# Patient Record
Sex: Male | Born: 1950
Health system: Southern US, Community
[De-identification: ages and names within clinical notes are randomized; demographics above are authoritative.]

## PROBLEM LIST (undated history)

## (undated) ENCOUNTER — Emergency Department (HOSPITAL_COMMUNITY): Admission: EM | Payer: Medicare FFS | Source: Home / Self Care

## (undated) DIAGNOSIS — G4730 Sleep apnea, unspecified: Secondary | ICD-10-CM

## (undated) DIAGNOSIS — M0520 Rheumatoid vasculitis with rheumatoid arthritis of unspecified site: Secondary | ICD-10-CM

## (undated) DIAGNOSIS — R918 Other nonspecific abnormal finding of lung field: Secondary | ICD-10-CM

## (undated) DIAGNOSIS — I4510 Unspecified right bundle-branch block: Secondary | ICD-10-CM

## (undated) DIAGNOSIS — I428 Other cardiomyopathies: Secondary | ICD-10-CM

## (undated) DIAGNOSIS — I5042 Chronic combined systolic (congestive) and diastolic (congestive) heart failure: Secondary | ICD-10-CM

## (undated) DIAGNOSIS — R011 Cardiac murmur, unspecified: Secondary | ICD-10-CM

## (undated) DIAGNOSIS — Z6841 Body Mass Index (BMI) 40.0 and over, adult: Secondary | ICD-10-CM

## (undated) DIAGNOSIS — F329 Major depressive disorder, single episode, unspecified: Secondary | ICD-10-CM

## (undated) DIAGNOSIS — K219 Gastro-esophageal reflux disease without esophagitis: Secondary | ICD-10-CM

## (undated) DIAGNOSIS — E119 Type 2 diabetes mellitus without complications: Secondary | ICD-10-CM

## (undated) DIAGNOSIS — E7800 Pure hypercholesterolemia, unspecified: Secondary | ICD-10-CM

## (undated) DIAGNOSIS — M48061 Spinal stenosis, lumbar region without neurogenic claudication: Secondary | ICD-10-CM

## (undated) DIAGNOSIS — N289 Disorder of kidney and ureter, unspecified: Secondary | ICD-10-CM

## (undated) DIAGNOSIS — I350 Nonrheumatic aortic (valve) stenosis: Secondary | ICD-10-CM

## (undated) DIAGNOSIS — R55 Syncope and collapse: Secondary | ICD-10-CM

## (undated) DIAGNOSIS — F32A Depression, unspecified: Secondary | ICD-10-CM

## (undated) DIAGNOSIS — G8929 Other chronic pain: Secondary | ICD-10-CM

## (undated) DIAGNOSIS — M549 Dorsalgia, unspecified: Secondary | ICD-10-CM

## (undated) DIAGNOSIS — Z952 Presence of prosthetic heart valve: Secondary | ICD-10-CM

## (undated) DIAGNOSIS — I2510 Atherosclerotic heart disease of native coronary artery without angina pectoris: Secondary | ICD-10-CM

## (undated) DIAGNOSIS — E1140 Type 2 diabetes mellitus with diabetic neuropathy, unspecified: Secondary | ICD-10-CM

## (undated) DIAGNOSIS — I10 Essential (primary) hypertension: Secondary | ICD-10-CM

## (undated) DIAGNOSIS — I440 Atrioventricular block, first degree: Secondary | ICD-10-CM

## (undated) HISTORY — DX: Dorsalgia, unspecified: M54.9

## (undated) HISTORY — DX: Pure hypercholesterolemia, unspecified: E78.00

## (undated) HISTORY — DX: Other cardiomyopathies: I42.8

## (undated) HISTORY — DX: Essential (primary) hypertension: I10

## (undated) HISTORY — DX: Body Mass Index (BMI) 40.0 and over, adult: Z684

## (undated) HISTORY — PX: TONSILLECTOMY: SUR1361

## (undated) HISTORY — DX: Morbid (severe) obesity due to excess calories: E66.01

## (undated) HISTORY — PX: UMBILICAL HERNIA REPAIR: SHX196

## (undated) HISTORY — DX: Major depressive disorder, single episode, unspecified: F32.9

## (undated) HISTORY — DX: Type 2 diabetes mellitus with diabetic neuropathy, unspecified: E11.40

## (undated) HISTORY — DX: Syncope and collapse: R55

## (undated) HISTORY — PX: HERNIA REPAIR: SHX51

## (undated) HISTORY — DX: Other chronic pain: G89.29

## (undated) HISTORY — DX: Sleep apnea, unspecified: G47.30

## (undated) HISTORY — PX: SHOULDER ARTHROSCOPY: SHX128

## (undated) HISTORY — DX: Depression, unspecified: F32.A

## (undated) HISTORY — DX: Nonrheumatic aortic (valve) stenosis: I35.0

## (undated) HISTORY — DX: Atherosclerotic heart disease of native coronary artery without angina pectoris: I25.10

## (undated) HISTORY — PX: LAPAROSCOPIC GASTRIC BANDING: SHX1100

---

## 1999-08-15 ENCOUNTER — Encounter: Admission: RE | Admit: 1999-08-15 | Discharge: 1999-11-13 | Payer: Self-pay | Admitting: Internal Medicine

## 2000-11-03 ENCOUNTER — Encounter: Payer: Self-pay | Admitting: Internal Medicine

## 2000-11-03 ENCOUNTER — Ambulatory Visit (HOSPITAL_COMMUNITY): Admission: RE | Admit: 2000-11-03 | Discharge: 2000-11-03 | Payer: Self-pay | Admitting: Internal Medicine

## 2001-06-17 ENCOUNTER — Observation Stay (HOSPITAL_COMMUNITY): Admission: AD | Admit: 2001-06-17 | Discharge: 2001-06-19 | Payer: Self-pay | Admitting: Internal Medicine

## 2001-06-17 ENCOUNTER — Encounter: Payer: Self-pay | Admitting: Internal Medicine

## 2002-10-01 ENCOUNTER — Observation Stay (HOSPITAL_COMMUNITY): Admission: RE | Admit: 2002-10-01 | Discharge: 2002-10-02 | Payer: Self-pay | Admitting: General Surgery

## 2002-11-16 ENCOUNTER — Ambulatory Visit (HOSPITAL_COMMUNITY): Admission: RE | Admit: 2002-11-16 | Discharge: 2002-11-17 | Payer: Self-pay | Admitting: Orthopedic Surgery

## 2002-11-16 ENCOUNTER — Encounter: Payer: Self-pay | Admitting: Orthopedic Surgery

## 2004-09-22 ENCOUNTER — Emergency Department (HOSPITAL_COMMUNITY): Admission: EM | Admit: 2004-09-22 | Discharge: 2004-09-22 | Payer: Self-pay | Admitting: Emergency Medicine

## 2004-09-23 ENCOUNTER — Emergency Department (HOSPITAL_COMMUNITY): Admission: EM | Admit: 2004-09-23 | Discharge: 2004-09-23 | Payer: Self-pay | Admitting: Emergency Medicine

## 2006-06-29 ENCOUNTER — Emergency Department (HOSPITAL_COMMUNITY): Admission: EM | Admit: 2006-06-29 | Discharge: 2006-06-29 | Payer: Self-pay | Admitting: Internal Medicine

## 2006-08-07 ENCOUNTER — Ambulatory Visit (HOSPITAL_COMMUNITY): Admission: RE | Admit: 2006-08-07 | Discharge: 2006-08-07 | Payer: Self-pay | Admitting: Internal Medicine

## 2006-12-01 ENCOUNTER — Ambulatory Visit: Admission: RE | Admit: 2006-12-01 | Discharge: 2006-12-01 | Payer: Self-pay | Admitting: Internal Medicine

## 2006-12-08 ENCOUNTER — Ambulatory Visit: Payer: Self-pay | Admitting: Pulmonary Disease

## 2006-12-18 ENCOUNTER — Ambulatory Visit (HOSPITAL_COMMUNITY): Admission: RE | Admit: 2006-12-18 | Discharge: 2006-12-18 | Payer: Self-pay | Admitting: Internal Medicine

## 2008-06-01 ENCOUNTER — Ambulatory Visit (HOSPITAL_COMMUNITY): Admission: RE | Admit: 2008-06-01 | Discharge: 2008-06-01 | Payer: Self-pay | Admitting: General Surgery

## 2010-12-11 NOTE — Procedures (Signed)
NAME:  ARMANI, BRAR NO.:  [id number]   MEDICAL RECORD NO.:  [id number]          PATIENT TYPE:  OUT   LOCATION:  DFTL                          FACILITY:  [facility]   PHYSICIAN:  Kingsley Callander. Ouida Sills, MD       DATE OF BIRTH:  1950-11-12   DATE OF PROCEDURE:  12/18/2006  DATE OF DISCHARGE:                                  STRESS TEST   Mr. Mankey exercised 4 minutes (1 minute into stage 2  of the Bruce  protocol) obtaining a maximal heart rate of 160 (96% age predicted  maximal heart rate) at a work load of 7 mets and discontinued exercise  due to ankle pain and dyspnea. There were no symptoms of chest pain.  There were no arrhythmias.  There were no ST-segment changes diagnostic  of ischemia.  His baseline EKG revealed normal sinus rhythm at 82 beats  per minute. His resting blood pressure was elevated at 168/88 today.  He  reached a maximum blood pressure of 182/70 during exercise.   IMPRESSION:  No evidence of ischemia, limited exercise tolerance.  Resting hypertension.      Kingsley Callander. Ouida Sills, MD  Electronically Signed     ROF/MEDQ  D:  12/18/2006  T:  12/18/2006  Job:  [id number]

## 2010-12-11 NOTE — H&P (Signed)
NAME:  Daniel Valenzuela, Daniel Valenzuela NO.:  [id number]   MEDICAL RECORD NO.:  [id number]          PATIENT TYPE:  AMB   LOCATION:  DAY                           FACILITY:  [facility]   PHYSICIAN:  Dalia Heading, M.D.  DATE OF BIRTH:  30-Dec-1950   DATE OF ADMISSION:  DATE OF DISCHARGE:  LH                              HISTORY & PHYSICAL   CHIEF COMPLAINT:  Need for screening colonoscopy.   HISTORY OF PRESENT ILLNESS:  The patient is a 60 year old white male who  was referred for endoscopic evaluation.  He needs colonoscopy for  screening purposes.  No abdominal pain, weight loss, nausea, vomiting,  diarrhea, constipation, melena, or hematochezia have been noted.  He has  never had a colonoscopy.  There is no family history of colon carcinoma.   PAST MEDICAL HISTORY:  1. Insulin-dependent diabetes mellitus.  2. Hypertension.   PAST SURGICAL HISTORY:  Umbilical herniorrhaphy, lap-band procedure.   CURRENT MEDICATIONS:  Lantus, verapamil, Darvocet, Neurontin, and  cholesterol medication.   ALLERGIES:  ALDORIL, AMOXICILLIN, and MOTRIN.   REVIEW OF SYSTEMS:  The patient denies drinking and smoking.  Denies any  other cardiopulmonary difficulties or bleeding disorders.   PHYSICAL EXAMINATION:  GENERAL:  The patient is a well-developed, well-  nourished white male in no acute distress.  LUNGS:  Clear to auscultation with equal breath sounds bilaterally.  HEART:  Regular rate and rhythm without S3, S4, or murmurs.  ABDOMEN:  Soft, nontender, and nondistended.  No hepatosplenomegaly or  masses were noted.  RECTAL:  Deferred to the procedure.   IMPRESSION:  Need for screening colonoscopy.   PLAN:  The patient is scheduled for a colonoscopy on June 01, 2008.  The risks and benefits of the procedure including bleeding and  perforation were fully explained to the patient, gave informed consent.      Dalia Heading, M.D.  Electronically Signed     MAJ/MEDQ  D:  05/26/2008   T:  05/27/2008  Job:  [id number]   cc:   Kingsley Callander. Ouida Sills, MD  Fax: [phone]

## 2010-12-14 NOTE — Op Note (Signed)
NAME:  Daniel Valenzuela, Daniel Valenzuela NO.:  [id number]   MEDICAL RECORD NO.:  [id number]                   PATIENT TYPE:  AMB   LOCATION:  DAY                                  FACILITY:  [facility]   PHYSICIAN:  Dalia Heading, M.D.               DATE OF BIRTH:  31-Dec-1950   DATE OF PROCEDURE:  10/01/2002  DATE OF DISCHARGE:                                 OPERATIVE REPORT   PREOPERATIVE DIAGNOSES:  1. Incarcerated umbilical hernia.  2. Enlarging mass on forehead.   POSTOPERATIVE DIAGNOSES:  1. Incarcerated umbilical hernia.  2. Enlarging mass on forehead.   PROCEDURES:  1. Umbilical herniorrhaphy with mesh.  2. Excision of benign 1.5 cm mass, forehead.   SURGEON:  Dalia Heading, M.D.   ANESTHESIA:  General endotracheal.   COMPLICATIONS:  None.   SPECIMENS:  1. Omentum and umbilical hernia sac.  2. Mass, forehead.   ESTIMATED BLOOD LOSS:  100 mL.   INDICATIONS:  The patient is a 60 year old morbidly obese white male who  presents with an incarcerated umbilical hernia as well as a mass on his  forehead.  Risks and benefits of both procedures, including bleeding,  infection, and recurrence of the hernia, were fully explained to the  patient, who gave informed consent.   DESCRIPTION OF PROCEDURE:  The patient was placed in the supine position.  After induction of general endotracheal anesthesia, the abdomen was prepped  and draped using the usual sterile technique with Betadine.  Surgical site  confirmation was performed.   An infraumbilical incision was made down to the subcutaneous tissue.  The  patient had a large hernia sac, which was freed away from the overlying  umbilicus down to the fascia.  The hernia sac had to be entered into in  order to help reduce the omentum that was present.  A partial omentectomy  was performed in order to facilitate reduction.  This was performed using an  LDS stapler.  The hernia sac was then excised.  The fascial  defect, which  was approximately 4-5 cm in its greatest diameter, was closed using 0  Ethibond interrupted sutures.  Next, Prolene mesh was placed over the repair  and secured to the fascia using #1 Novofil sutures.  The base of the  umbilicus was then secured to the fascia using a 2-0 Vicryl interrupted  suture.  The subcutaneous layer was reapproximated using a 3-0 Vicryl  interrupted suture.  The skin was closed using staples.  Sensorcaine 0.5%  was instilled into the surrounding wound.  Betadine ointment and a dry  sterile dressing were applied.   Next, the right upper forehead mass was prepped using Betadine.  A  transverse incision was made over the mass and the mass was excised without  difficulty.  It was noted to be a lipoma.  It was sent to pathology for  further examination.  Any  bleeding was controlled using Bovie  electrocautery.  The skin was closed using a 5-0 nylon interrupted suture.  Neosporin ointment was then applied.   All tape and needle counts were correct at the end of the procedures.  The  patient was extubated in the operating room and went back to the recovery  room awake, in stable condition.                                               Dalia Heading, M.D.    MAJ/MEDQ  D:  10/01/2002  T:  10/01/2002  Job:  [id number]   cc:   Kingsley Callander. Ouida Sills, M.D.  [address]  [place]  [place] [address]  Fax: [phone]

## 2010-12-14 NOTE — H&P (Signed)
NAME:  WITTEN, CERTAIN NO.:  [id number]   MEDICAL RECORD NO.:  [id number]                  PATIENT TYPE:   LOCATION:                                       FACILITY:   PHYSICIAN:  Dalia Heading, M.D.               DATE OF BIRTH:  March 19, 1951   DATE OF ADMISSION:  DATE OF DISCHARGE:                                HISTORY & PHYSICAL   CHIEF COMPLAINT:  Umbilical hernia, mass on forehead.   HISTORY OF PRESENT ILLNESS:  The patient is a 60 year old white male who has  had ongoing problems with an umbilical hernia.  It has been present for some  time but his surgery was delayed as he was instructed to try to lose weight.  He has lost 80 pounds over the past nine months.  He now presents for an  umbilical hernia repair.  He also has a mass on his forehead that he would  like removed.  He denies any nausea or vomiting.   PAST MEDICAL HISTORY:  1. Hypertension.  2. Diabetes mellitus.  3. Depression.   PAST SURGICAL HISTORY:  Tonsillectomy in the remote past.   CURRENT MEDICATIONS:  1. Norvasc 5 mg p.o. daily.  2. Avandia 8 mg p.o. daily.  3. Cozaar 100 mg p.o. daily.  4. Paxil one tablet p.o. daily.  5. Bextra one tablet p.o. daily.  6. Neurontin 300 mg p.o. daily.  7. Lasix 40 mg p.o. daily.  8. Lipitor 40 mg p.o. daily.  9. Insulin L injections daily.  10.      Humalog p.r.n.   ALLERGIES:  ALDORIL, AMOXICILLIN, MOTRIN.   REVIEW OF SYSTEMS:  The patient denies drinking or smoking.  Denies any  recent cardiopulmonary difficulties or bleeding disorders.   PHYSICAL EXAMINATION:  GENERAL:  The patient is a well-developed, well-  nourished white male in no acute distress.  VITAL SIGNS:  He is afebrile.  Vital signs are stable.  LUNGS:  Clear to auscultation with equal breath sounds bilaterally.  HEART:  Regular rate and rhythm without S3, S3, or murmurs.  ABDOMEN:  Soft.  Large umbilical hernia is present.  No inguinal hernias are  noted.  No  hepatosplenomegaly or masses are noted.  HEENT:  2 cm ovoid subcutaneous mass on the right side of the forehead.   IMPRESSION:  1. Umbilical hernia.  2. Mass, forehead.   PLAN:  The patient is scheduled for an umbilical herniorrhaphy with possible  mesh placement as well as excision of a mass on his forehead on October 01, 2002.  The risks and benefits of the procedures including bleeding,  infection, recurrence of the hernia were fully explained to the patient,  gave informed consent.  Dalia Heading, M.D.    MAJ/MEDQ  D:  09/23/2002  T:  09/23/2002  Job:  [id number]   cc:   Kingsley Callander. Ouida Sills, M.D.  [address]  [place]  [place] [address]  Fax: [phone]

## 2010-12-14 NOTE — Op Note (Signed)
NAME:  Daniel Valenzuela, Daniel Valenzuela NO.:  [id number]   MEDICAL RECORD NO.:  [id number]                   PATIENT TYPE:  OIB   LOCATION:  5022                                 FACILITY:  [facility]   PHYSICIAN:  Deidre Ala, M.D.                 DATE OF BIRTH:  [birth year]   DATE OF PROCEDURE:  11/16/2002  DATE OF DISCHARGE:                                 OPERATIVE REPORT   PREOPERATIVE DIAGNOSIS:  Right shoulder impingement syndrome with  acromioclavicular joint arthritis, rule out rotator cuff tear, morbid  obesity.   POSTOPERATIVE DIAGNOSES:  1. Right shoulder impingement syndrome.  2. Acromioclavicular joint arthritis.  3. Severe subdeltoid bursitis.  4. Through-and-through rotator cuff tear longitudinal with intact tuft of     tuberosity, tear 1.5 cm.  5. Morbid obesity.   OPERATION PERFORMED:  1. Right shoulder operative arthroscopy with subacromial arch decompression     acromioplasty.  2. Arthroscopic distal clavicle resection.  3. Subdeltoid bursectomy.  4. Debris glenohumeral synovitis and rotator cuff tear.   SURGEON:  Bradley Ferris, M.D.   ASSISTANT:  Madilyn Fireman, P.A.-C.   ANESTHESIA:  General endotracheal.   CULTURES:  None.   DRAINS:  None.   ESTIMATED BLOOD LOSS:  Minimal.   PATHOLOGIC FINDINGS AND HISTORY:  The patient weighs 350 pounds and has  sleep apnea.  He has lost recently 80 pounds.  That was deemed best for [facility].  We will admit him overnight.  He did have a block.  He was very  difficult due to his arm size and bleeding.  This was bleeding  intraoperatively with the arthroscope but ultimately it cleared and we got  distal clavicle resection, acromioplasty with parameters of Caspari.  The  rotator cuff tear was longitudinal.  The tissue was poor.  I felt we would  cause more damage going through the deltoid to fix it with a side-to-side  repair that would not hold and he would scar.  We therefore trimmed the  edges.   It was intact around this tear to the tuberosity.  It was more in  the interval between the supraspinatus infraspinatus and it was about 1.5  cm.  The glenohumeral joint had intact biceps, no SLAP lesion, did not have  significant glenohumeral degenerative change and the humeral head,  glenohumeral surfaces were not significantly involved with arthritis.   DESCRIPTION OF PROCEDURE:  With adequate anesthesia obtained using  endotracheal technique and very laborious pains for appropriate positioning,  the patient was placed in the supine beach chair position with a Schlein  shoulder holder.  After standard prepping and draping of the right shoulder,  skin markers were made for anatomic positioning.  20mL 0.5% Marcaine with  epinephrine was injected in the subacromial space to open it up.  I then  entered the shoulder through a posterior portal.  Anterior portal was  established just lateral to the coracoid.  I then thoroughly inspected the  joint and used a shaver to remove synovium under the rotator cuff, around  the biceps and to check the SLAP area.  I then used the ablator to smooth.  Portals were reversed and similar shaving was carried out.  I then arduously  entered the subacromial space from the posterior portal.  Anterolateral  portal was established and acromioplasty was started from anterolateral  portal.  I had to take out a fair amount of bursa just to be able to see.  To complete it I looked through the lateral portal and brought in the shaver  from the anterior portal and completed acromioplasty back to the bicortical  space in the manner of Caspari.  Then I debrided through the anterior portal  the AC meniscus, then used the shaver to shave it two shaverbreadths in.  I  then cauterized around the distal clavicle resection. I then turned the  scope downward and debrided subacromial bursa.  I then debrided the edges of  the rotator cuff ear but left it basically intact, not  debriding out the  spinatus, just trimming the edges and using the ablator to smooth, to  decrease inflammation.  I checked to make sure that the rotator cuff distal  to the tear and all around it was attached to the tuberosity and it was.  _________  ablator was used with internal external rotation and abduction to  smooth the surface.  When I was satisfied with resection, the shoulder was  irrigated through the scope with 0.5% Marcaine injected in and about the  portals.  The portals were left open.  A bulky sterile dressing was applied  with sling and the patient having tolerated the procedure well was awakened  and taken to recovery room in satisfactory condition to be admitted for  overnight stay and discharged tomorrow with Percocet and told to call the  office for appointment for recheck in two or three days.                                                Deidre Ala, M.D.    VEP/MEDQ  D:  11/16/2002  T:  11/16/2002  Job:  [id number]   cc:   Kingsley Callander. Ouida Sills, M.D.  [address]  [place]  [place] [address]  Fax: [phone]

## 2010-12-14 NOTE — H&P (Signed)
[facility]  Patient:    Daniel Valenzuela, Daniel Valenzuela Visit Number: [id number] MRN: [id number]          Service Type: OBS Location: 2A A212 01 Attending Physician:  Carylon Perches Dictated by:   Carylon Perches, M.D. Admit Date:  06/17/2001 Discharge Date: 06/19/2001                           History and Physical  CHIEF COMPLAINT:  Chest pain and sweating.  HISTORY OF PRESENT ILLNESS:  This patient is a 60 year old white male who presented to the office for evaluation of an episode of chest pain and sweating yesterday.  He had been carrying approximately a 25-pound piece of duct work when he experienced a brief episode of substernal chest pain and then broke out into a profuse sweat.  He sat down and had no further chest pain.  His sweating resolved.  He went about his work the rest of the day.  He did not vomit or experience syncope.  He had mild shortness of breath.  He did not feel as though this episode was related to hypoglycemia.  He has a past history of diabetes, hypertension, and hyperlipidemia.  He does not smoke.  He has previously had a negative Cardiolite stress test in 1998.  He had a negative routine stress test in 1999.  PAST MEDICAL HISTORY: 1. Type 2 diabetes. 2. Morbid obesity. 3. Hypertension. 4. Hyperlipidemia. 5. Diabetic neuropathy. 6. Anxiety. 7. Osteoarthritis.  MEDICATIONS: 1. Lipitor 40 mg q.d. 2. Cozaar 100 mg q.d. 3. Paxil 30 mg q.d. 4. Avandia 8 mg q.d. 5. Humulin 70/30, 55 units q.a.m., 40 units q.p.m. 6. Humalog 20 units at supper. 7. Lasix 40 mg q.d. 8. Neurontin 300 mg b.i.d.  ALLERGIES:  AMOXIL, MOTRIN, ALDORIL.  SOCIAL HISTORY:  He does not use any tobacco, alcohol, or recreational substances.  FAMILY HISTORY:  His mother died of cancer.  His father had Parkinsons disease.  REVIEW OF SYSTEMS:  No cough, fever, weight loss.  PHYSICAL EXAMINATION:  VITAL SIGNS:  Blood pressure 156/90, pulse 76, respirations  16.  GENERAL:  A morbidly obese white male.  HEENT:  No scleral icterus.  Pharynx unremarkable.  NECK:  Supple with no JVD, thyromegaly, or bruit.  LUNGS:  Clear.  HEART:  Regular with no murmurs.  ABDOMEN:  A large umbilical hernia is present.  No hepatosplenomegaly.  EXTREMITIES:  No foot ulcers.  No edema, cyanosis, or clubbing.  NEUROLOGIC:  Grossly intact.  LYMPH NODES:  No enlargement.  LABORATORY DATA:  His EKG reveals normal sinus rhythm with no definite ischemic changes.  IMPRESSION: 1. Chest pain and diaphoresis in a morbidly obese male with multiple    cardiovascular risk factors.  He is being hospitalized in a monitored    setting on 2A for serial cardiac enzymes.  If his enzymes are positive, he    will be further evaluated by cardiology, likely with cardiac    catheterization.  If his enzymes are negative, he will be evaluated with a    Cardiolite stress test. 2. Diabetes.  Continue with Avandia and insulin. 3. Hypertension.  Continue Cozaar. 4. Hyperlipidemia.  Continue Lipitor. 5. Diabetic neuropathy.  Continue Neurontin. 6. Anxiety.  Continue Paxil. Dictated by:   Carylon Perches, M.D. Attending Physician:  Carylon Perches DD:  06/17/01 TD:  06/17/01 Job: [id number]

## 2010-12-14 NOTE — Procedures (Signed)
NAME:  Daniel Valenzuela, FESSEL NO.:  [id number]   MEDICAL RECORD NO.:  [id number]          PATIENT TYPE:  OUT   LOCATION:  SLEEP LAB                     FACILITY:  [facility]   PHYSICIAN:  Barbaraann Share, MD,FCCPDATE OF BIRTH:  1950-12-10   DATE OF STUDY:  12/01/2006                            NOCTURNAL POLYSOMNOGRAM   REFERRING PHYSICIAN:  Kingsley Callander. Ouida Sills, MD   LOCATION:  Sleep lab.   INDICATION FOR STUDY:  Hypersomnia with sleep apnea.   EPWORTH SCORE:  10.   SLEEP ARCHITECTURE:  The patient had total sleep time of 309 minutes  with adequate slow-wave sleep but decreased REM.  Sleep onset latency  was prolonged at 48 minutes and REM onset was very prolonged at 246  minutes.  Sleep efficiency was very decreased at 65%.   RESPIRATORY DATA:  The patient underwent split-night protocol where he  was found to have 96 obstructive events in the first 90 minutes of  sleep.  This gave him an extrapolated apnea/hypopnea index of 64 events  per hour during the first half of the night.  The events occurred in all  body positions and there was very loud snoring noted throughout.  By  protocol, the patient was placed on a medium Respironics ComfortGel  nasal CPAP mask and CPAP titration was initiated.  The pressure was  increased to treat both snoring and obstructive events, and at a final  pressure of 9 cm of water, the patient had excellent control even  through supine REM.   OXYGEN DATA:  There was O2 desaturation as low as 78% with the patient's  obstructive events.   CARDIAC DATA:  Occasional PVCs were noted.   MOVEMENT/PARASOMNIA:  The patient was found to have 149 leg jerks with 8  per hour resulting in arousal or awakening.  They did seem to decrease  in frequency with optimal CPAP.   IMPRESSION/RECOMMENDATIONS:  1. Split-night study reveals severe obstructive sleep apnea/hypopnea      syndrome with an apnea/hypopnea index of 64 events per hour and O2      desaturation as  low as 78% during the first half of the night.  The      patient was then placed on continuous positive airway pressure with      a medium Respironics ComfortGel nasal mask and ultimately titrated      to a final pressure of 9 cm of water.  There was excellent control      even through supine rapid eye movement.  2. Occasional premature ventricular contraction without clinically      significant cardiac arrhythmias.  3. Large numbers of leg jerks with significant sleep disruption.  More      than likely this is related to the patient's sleep-disordered      breathing since there clearly was a decrease in the number of leg      jerks as the continuous      positive airway pressure was optimized.  However, would continue to      keep a primary movement disorder of sleep in mind if the patient  does not respond appropriately to optimal continuous positive      airway pressure.      Barbaraann Share, MD,FCCP  Diplomate, American Board of Sleep  Medicine  Electronically Signed     [facility]/MEDQ  D:  12/04/2006 11:37:51  T:  12/04/2006 12:34:11  Job:  [id number]

## 2010-12-14 NOTE — Procedures (Signed)
[facility]  Patient:    Daniel Valenzuela, Daniel Valenzuela Visit Number: [id number] MRN: [id number]          Service Type: OBS Location: 2A A212 01 Attending Physician:  Carylon Perches Dictated by:   Delton See, P.A. Proc. Date: 06/19/01 Admit Date:  06/17/2001 Discharge Date: 06/19/2001   CC:         Carylon Perches, M.D.   Stress Test  DATE OF BIRTH:  [birth year]  PROCEDURE:  Dobutamine stress test.  INDICATION:  [name] is a pleasant 60 year old male who was admitted to [facility] on June 17, 2001 for evaluation of chest pain by Dr. Carylon Perches, his primary physician.  The patient does have significant risk factors for coronary disease including obesity, history of diabetes, hypertension and elevated cholesterol levels.  He was to be scheduled for an exercise Cardiolite, however, he did not meet the criteria for weight limitations on the treadmill and the Cardiolite could not be performed.  An echo was attempted, however, the windows were poor and a decision was eventually made to proceed with a dobutamine stress test without Cardiolite images.  Prior to the test today, the patient reports some chest soreness approximately two days ago but felt fine prior to the study.  His baseline EKG shows sinus rhythm, rate 93 beats per minute, without ischemic changes.  Blood pressure is 114/78.  Target heart rate was 147 beats per minute.  DESCRIPTION OF PROCEDURE:  Dobutamine was started at 10 micro-drops per minute, gradually increased to 20, leg lifts were initiated and the patient met his target heart rate; the maximum heart rate was 156 beats per minute. The patient had no symptoms other than feeling his heart racing and pounding. The EKG showed no ischemic changes.  The test was concluded.  IMPRESSION:  We are interpreting this as a negative dobutamine stress test. Dictated by:   Delton See, P.A. Attending Physician:  Carylon Perches DD:   06/19/01 TD:  06/20/01 Job: [id number]

## 2011-04-30 LAB — GLUCOSE, CAPILLARY: Glucose-Capillary: 90

## 2011-08-09 ENCOUNTER — Telehealth (HOSPITAL_COMMUNITY): Payer: Self-pay | Admitting: Dietician

## 2011-08-09 NOTE — Telephone Encounter (Signed)
Received referral from Dr. Alonza Smoker office for dx: diabetes, obesity, HTN, chronic back pain, and diabetic neuropathy on 08/06/11.

## 2011-08-09 NOTE — Telephone Encounter (Signed)
Appointment scheduled for 08/21/11 at 10 AM.

## 2011-08-19 ENCOUNTER — Telehealth (HOSPITAL_COMMUNITY): Payer: Self-pay | Admitting: Dietician

## 2011-08-20 NOTE — Telephone Encounter (Signed)
Appointment rescheduled for 08/22/11 at 2:00 PM.

## 2011-08-22 ENCOUNTER — Encounter (HOSPITAL_COMMUNITY): Payer: Self-pay | Admitting: Dietician

## 2011-08-22 DIAGNOSIS — E669 Obesity, unspecified: Secondary | ICD-10-CM | POA: Insufficient documentation

## 2011-08-22 DIAGNOSIS — M4800 Spinal stenosis, site unspecified: Secondary | ICD-10-CM | POA: Insufficient documentation

## 2011-08-22 DIAGNOSIS — M549 Dorsalgia, unspecified: Secondary | ICD-10-CM | POA: Insufficient documentation

## 2011-08-22 DIAGNOSIS — E1140 Type 2 diabetes mellitus with diabetic neuropathy, unspecified: Secondary | ICD-10-CM | POA: Insufficient documentation

## 2011-08-22 DIAGNOSIS — E7800 Pure hypercholesterolemia, unspecified: Secondary | ICD-10-CM | POA: Insufficient documentation

## 2011-08-22 NOTE — Progress Notes (Signed)
Outpatient Initial Nutrition Assessment  Date:08/22/2011   Time: 2:00 PM  Referring Physician: Dr. Ouida Sills Reason for Visit: diabetes, obesity  Nutrition Assessment:  Ht: 67" Wt: 348# (self reported. Pt refused to weigh in office. Reports he was 348# on his home scale this AM) IBW: 148# %IBW: 235% UBW: 348# %UBW: 100% BMI: 54.50 Goal Weight: 313# (10% weight loss) Weight hx: Pt reports his highest weight was 452# in April 2008, prior to his lap band surgery. His lowest weight was 220# at age 59-32. He was able to maintain a weight of 290# until 1990, when he progressively gain weight. He reports he has always struggled with his weight.   Estimated nutritional needs: 2960-3229 kcals daily, 127-158 grams protein daily, 3.0-3.2 L fluid daily  PMH:  Past Medical History  Diagnosis Date  . Diabetes mellitus   . Hypertension   . Diabetic neuropathy   . Obesity   . Chronic back pain   . Spinal stenosis   . High cholesterol   . Depression   . Sleep apnea     Medications: No current outpatient prescriptions on file.  Pt did not bring list of meds to appointment today.   Labs:  CMP  No results found for this basename: na, k, cl, co2, glucose, bun, creatinine, calcium, prot, albumin, ast, alt, alkphos, bilitot, gfrnonaa, gfraa     Lipid Panel  No results found for this basename: chol, trig, hdl, cholhdl, vldl, ldlcalc     No results found for this basename: HGBA1C   No results found for this basename: GLUF, MICROALBUR, LDLCALC, CREATININE   Per Dr. Alonza Smoker records: Hgb A1c: 8.7, Fasting blood glucose: 203, Total Cholesterol: 185, HDL: 36, LDL: 114, Triglycerides: 175  Nutrition hx/habits: [name] is a very pleasant gentleman who lives in [place], [place] with his wife, Corrie Dandy, who is present with him today. He is currently on disability due to back pain, but used to work for [employer] and [employer]. He reports a stress level of 7-10, citing finances, road rage, and family issues (pt and  wife are often responsible for their 28 year old grandson, who is bullied at school and has lack of parental support). Pt wife also complains of financial stressors and being unable to afford "healthy food". Pt reports he has "not been eating right", especially during the holidays. He checks his CBGs twice daily, reporting the run in the 160's, both fasting and postprandial. He admits that his biggest issue is portion control. He reports he has not been exercising, due to foot and knee pain. Pt reports that they both recently received their Silver Sneakers cards and plan to try water aerobics. Pt also recently purchased a bicycle, which he intends to use when it is warmer, but admits that he experiences pain if riding for long periods of time. Noted pt's last visit here was in July 2008 and pt has lost a significant amount of weight (81#, 19%) since then.   Diet recall: Breakfast: cereal OR oatmeal OR Ensure OR skim milk; Snack: decaf coffee with artificial sweetener; Lunch: "green bean chili" (prepared by wife- mixture of boiled hamburger meat, diced tomatoes, chili seasoning, and green beans), cheese, oyster crackers; Dinner: same as lunch Pt mostly drinks diet mountain dew, unsweetened tea with artificial sweetener, or coffee with artificial sweetener  Nutrition Diagnosis: Excessive carbohydrate intake r/t increased portions of simple carbohydrates AEB Hgb A1c: 8.7.   Nutrition Intervention: Nutrition rx:2500 calorie diabetic, NAS diet; 3 meals/day (60-75 grams carbohydrate  per meal); low calorie beverages only; do not drink fluids before meals; meals 4-5 hours apart; 2.5 hours physical activity per week  Education/Counseling Provided: Educated pt and wife on diabetic diet principles. Emphasized sources of carbohydrate, portion control, and plate method. Discussed low cost recipes and ingredients. Discussed high fiber foods and encouraged fruit and vegetable intake. Discussed more economical protein  alternatives to meat and fish, such as beans and eggs. Discussed importance of regular physical activity. Educated on slow, moderate weight loss with small lifestyle changes; 1-2# weight loss per week.   Understanding, Motivation, Ability to Follow Recommendations: Expect fair to good compliance.   Monitoring and Evaluation: Goals: 1) 1-2# weight loss per week; 2) 2.5 hours physical activity daily; 3) Hgb A1c < 7.0  Recommendations: 1) For weight loss: 2460-2729 kcals daily; 2) Use measuring cups to ensure proper portions; 3) Limit crackers to 4-6 per meal; 4) Try water aerobics class; 5) Break up exercise into smaller more frequent sessions (ex. Ride bike for 10 minutes 3 times daily)  F/U: PRN. Provided RD contact information. Due to commuting distance and finances, encouraged pt to call with questions or schedule appointment on same day as f/u with Dr. Ouida Sills.   Orlene Plum, RD  08/22/2011  Time: 2:00 PM

## 2012-03-16 ENCOUNTER — Other Ambulatory Visit (HOSPITAL_COMMUNITY): Payer: Self-pay | Admitting: Internal Medicine

## 2012-03-16 ENCOUNTER — Ambulatory Visit (HOSPITAL_COMMUNITY)
Admission: RE | Admit: 2012-03-16 | Discharge: 2012-03-16 | Disposition: A | Discharge status: Home / Self Care | Payer: Medicare PPO | Source: Ambulatory Visit | Attending: Internal Medicine | Admitting: Internal Medicine

## 2012-03-16 DIAGNOSIS — M5380 Other specified dorsopathies, site unspecified: Secondary | ICD-10-CM | POA: Insufficient documentation

## 2012-03-16 DIAGNOSIS — R05 Cough: Secondary | ICD-10-CM | POA: Insufficient documentation

## 2012-03-16 DIAGNOSIS — J9819 Other pulmonary collapse: Secondary | ICD-10-CM | POA: Insufficient documentation

## 2012-03-16 DIAGNOSIS — Z77090 Contact with and (suspected) exposure to asbestos: Secondary | ICD-10-CM | POA: Insufficient documentation

## 2012-03-16 DIAGNOSIS — R059 Cough, unspecified: Secondary | ICD-10-CM | POA: Insufficient documentation

## 2013-01-25 ENCOUNTER — Other Ambulatory Visit (HOSPITAL_COMMUNITY): Payer: Self-pay | Admitting: Internal Medicine

## 2013-01-25 ENCOUNTER — Ambulatory Visit (HOSPITAL_COMMUNITY)
Admission: RE | Admit: 2013-01-25 | Discharge: 2013-01-25 | Disposition: A | Discharge status: Home / Self Care | Payer: Medicare PPO | Source: Ambulatory Visit | Attending: Internal Medicine | Admitting: Internal Medicine

## 2013-01-25 DIAGNOSIS — E119 Type 2 diabetes mellitus without complications: Secondary | ICD-10-CM | POA: Insufficient documentation

## 2013-01-25 DIAGNOSIS — J984 Other disorders of lung: Secondary | ICD-10-CM | POA: Insufficient documentation

## 2013-01-25 DIAGNOSIS — I10 Essential (primary) hypertension: Secondary | ICD-10-CM | POA: Insufficient documentation

## 2013-01-25 DIAGNOSIS — R05 Cough: Secondary | ICD-10-CM

## 2013-01-25 DIAGNOSIS — R059 Cough, unspecified: Secondary | ICD-10-CM | POA: Insufficient documentation

## 2013-06-08 ENCOUNTER — Ambulatory Visit (HOSPITAL_COMMUNITY)
Admission: RE | Admit: 2013-06-08 | Discharge: 2013-06-08 | Disposition: A | Discharge status: Home / Self Care | Payer: Medicare PPO | Source: Ambulatory Visit | Attending: Internal Medicine | Admitting: Internal Medicine

## 2013-06-08 DIAGNOSIS — E119 Type 2 diabetes mellitus without complications: Secondary | ICD-10-CM | POA: Insufficient documentation

## 2013-06-08 DIAGNOSIS — E669 Obesity, unspecified: Secondary | ICD-10-CM | POA: Insufficient documentation

## 2013-06-08 DIAGNOSIS — Z6841 Body Mass Index (BMI) 40.0 and over, adult: Secondary | ICD-10-CM | POA: Insufficient documentation

## 2013-06-08 DIAGNOSIS — R011 Cardiac murmur, unspecified: Secondary | ICD-10-CM | POA: Insufficient documentation

## 2013-06-08 DIAGNOSIS — I10 Essential (primary) hypertension: Secondary | ICD-10-CM | POA: Insufficient documentation

## 2013-06-08 DIAGNOSIS — I359 Nonrheumatic aortic valve disorder, unspecified: Secondary | ICD-10-CM

## 2013-06-08 DIAGNOSIS — E785 Hyperlipidemia, unspecified: Secondary | ICD-10-CM | POA: Insufficient documentation

## 2013-06-08 NOTE — Progress Notes (Signed)
*  PRELIMINARY RESULTS* Echocardiogram 2D Echocardiogram has been performed.  Daniel Valenzuela 06/08/2013, 3:25 PM

## 2017-01-28 DIAGNOSIS — M545 Low back pain: Secondary | ICD-10-CM | POA: Diagnosis not present

## 2017-01-28 DIAGNOSIS — E1129 Type 2 diabetes mellitus with other diabetic kidney complication: Secondary | ICD-10-CM | POA: Diagnosis not present

## 2017-03-27 ENCOUNTER — Other Ambulatory Visit: Payer: Self-pay | Admitting: Otolaryngology

## 2017-05-14 ENCOUNTER — Encounter (HOSPITAL_COMMUNITY): Admission: RE | Payer: Self-pay | Source: Ambulatory Visit

## 2017-05-14 ENCOUNTER — Ambulatory Visit (HOSPITAL_COMMUNITY): Admission: RE | Admit: 2017-05-14 | Payer: Medicare FFS | Source: Ambulatory Visit | Admitting: Otolaryngology

## 2017-05-14 SURGERY — EXCISION, LESION, TONGUE
Anesthesia: General

## 2017-08-30 ENCOUNTER — Other Ambulatory Visit: Payer: Self-pay

## 2017-08-30 ENCOUNTER — Emergency Department (HOSPITAL_COMMUNITY): Payer: Medicare PPO

## 2017-08-30 ENCOUNTER — Inpatient Hospital Stay (HOSPITAL_COMMUNITY)
Admission: EM | Admit: 2017-08-30 | Discharge: 2017-09-01 | DRG: 261 | Disposition: A | Discharge status: Home / Self Care | Payer: Medicare PPO | Attending: Cardiovascular Disease | Admitting: Cardiovascular Disease

## 2017-08-30 ENCOUNTER — Encounter (HOSPITAL_COMMUNITY): Payer: Self-pay

## 2017-08-30 DIAGNOSIS — Z833 Family history of diabetes mellitus: Secondary | ICD-10-CM

## 2017-08-30 DIAGNOSIS — Z9884 Bariatric surgery status: Secondary | ICD-10-CM

## 2017-08-30 DIAGNOSIS — K219 Gastro-esophageal reflux disease without esophagitis: Secondary | ICD-10-CM | POA: Diagnosis present

## 2017-08-30 DIAGNOSIS — I440 Atrioventricular block, first degree: Principal | ICD-10-CM | POA: Diagnosis present

## 2017-08-30 DIAGNOSIS — R55 Syncope and collapse: Secondary | ICD-10-CM | POA: Diagnosis not present

## 2017-08-30 DIAGNOSIS — Z23 Encounter for immunization: Secondary | ICD-10-CM | POA: Diagnosis not present

## 2017-08-30 DIAGNOSIS — I452 Bifascicular block: Secondary | ICD-10-CM | POA: Diagnosis present

## 2017-08-30 DIAGNOSIS — E1140 Type 2 diabetes mellitus with diabetic neuropathy, unspecified: Secondary | ICD-10-CM | POA: Diagnosis present

## 2017-08-30 DIAGNOSIS — G4730 Sleep apnea, unspecified: Secondary | ICD-10-CM | POA: Diagnosis present

## 2017-08-30 DIAGNOSIS — F17290 Nicotine dependence, other tobacco product, uncomplicated: Secondary | ICD-10-CM | POA: Diagnosis present

## 2017-08-30 DIAGNOSIS — I10 Essential (primary) hypertension: Secondary | ICD-10-CM | POA: Diagnosis present

## 2017-08-30 DIAGNOSIS — Z6841 Body Mass Index (BMI) 40.0 and over, adult: Secondary | ICD-10-CM

## 2017-08-30 DIAGNOSIS — I350 Nonrheumatic aortic (valve) stenosis: Secondary | ICD-10-CM | POA: Diagnosis present

## 2017-08-30 DIAGNOSIS — E7800 Pure hypercholesterolemia, unspecified: Secondary | ICD-10-CM | POA: Diagnosis present

## 2017-08-30 DIAGNOSIS — I459 Conduction disorder, unspecified: Secondary | ICD-10-CM | POA: Diagnosis not present

## 2017-08-30 DIAGNOSIS — E0842 Diabetes mellitus due to underlying condition with diabetic polyneuropathy: Secondary | ICD-10-CM

## 2017-08-30 DIAGNOSIS — R42 Dizziness and giddiness: Secondary | ICD-10-CM | POA: Diagnosis not present

## 2017-08-30 DIAGNOSIS — R9431 Abnormal electrocardiogram [ECG] [EKG]: Secondary | ICD-10-CM | POA: Diagnosis not present

## 2017-08-30 HISTORY — DX: Rheumatoid vasculitis with rheumatoid arthritis of unspecified site: M05.20

## 2017-08-30 HISTORY — DX: Gastro-esophageal reflux disease without esophagitis: K21.9

## 2017-08-30 LAB — BASIC METABOLIC PANEL
ANION GAP: 10 (ref 5–15)
BUN: 12 mg/dL (ref 6–20)
CHLORIDE: 99 mmol/L — AB (ref 101–111)
CO2: 30 mmol/L (ref 22–32)
Calcium: 9 mg/dL (ref 8.9–10.3)
Creatinine, Ser: 0.64 mg/dL (ref 0.61–1.24)
GFR calc non Af Amer: >60 mL/min (ref >60)
Glucose, Bld: 151 mg/dL — ABNORMAL HIGH (ref 65–99)
POTASSIUM: 4.2 mmol/L (ref 3.5–5.1)
Sodium: 139 mmol/L (ref 135–145)

## 2017-08-30 LAB — CBC WITH DIFFERENTIAL/PLATELET
BASOS PCT: 0 %
Basophils Absolute: 0 10*3/uL (ref 0.0–0.1)
Eosinophils Absolute: 0.1 10*3/uL (ref 0.0–0.7)
Eosinophils Relative: 2 %
HEMATOCRIT: 39.3 % (ref 39.0–52.0)
HEMOGLOBIN: 12.5 g/dL — AB (ref 13.0–17.0)
LYMPHS ABS: 1.6 10*3/uL (ref 0.7–4.0)
Lymphocytes Relative: 21 %
MCH: 27.7 pg (ref 26.0–34.0)
MCHC: 31.8 g/dL (ref 30.0–36.0)
MCV: 86.9 fL (ref 78.0–100.0)
MONOS PCT: 8 %
Monocytes Absolute: 0.6 10*3/uL (ref 0.1–1.0)
NEUTROS ABS: 5.3 10*3/uL (ref 1.7–7.7)
NEUTROS PCT: 69 %
Platelets: 198 10*3/uL (ref 150–400)
RBC: 4.52 MIL/uL (ref 4.22–5.81)
RDW: 14.5 % (ref 11.5–15.5)
WBC: 7.6 10*3/uL (ref 4.0–10.5)

## 2017-08-30 LAB — CBG MONITORING, ED: Glucose-Capillary: 160 mg/dL — ABNORMAL HIGH (ref 65–99)

## 2017-08-30 LAB — TSH: TSH: 1.824 u[IU]/mL (ref 0.350–4.500)

## 2017-08-30 LAB — I-STAT TROPONIN, ED: Troponin i, poc: 0.02 ng/mL (ref 0.00–0.08)

## 2017-08-30 LAB — GLUCOSE, CAPILLARY: GLUCOSE-CAPILLARY: 96 mg/dL (ref 65–99)

## 2017-08-30 MED ORDER — ACETAMINOPHEN 325 MG PO TABS: 650.0000 mg | ORAL_TABLET | ORAL | Status: DC | PRN

## 2017-08-30 MED ORDER — HEPARIN SODIUM (PORCINE) 5000 UNIT/ML IJ SOLN
5000.0000 [IU] | Freq: Three times a day (TID) | INTRAMUSCULAR | Status: DC
  Administered 2017-08-31 – 2017-09-01 (×4): 5000 [IU] via SUBCUTANEOUS
  Filled 2017-08-30 (×4): qty 1

## 2017-08-30 MED ORDER — FUROSEMIDE 40 MG PO TABS
40.0000 mg | ORAL_TABLET | Freq: Every morning | ORAL | Status: DC
  Administered 2017-08-31 – 2017-09-01 (×2): 40 mg via ORAL
  Filled 2017-08-30 (×2): qty 1

## 2017-08-30 MED ORDER — GABAPENTIN 600 MG PO TABS
600.0000 mg | ORAL_TABLET | Freq: Every day | ORAL | Status: DC
  Administered 2017-08-31 – 2017-09-01 (×2): 600 mg via ORAL
  Filled 2017-08-30 (×2): qty 1

## 2017-08-30 MED ORDER — ASPIRIN 81 MG PO CHEW
324.0000 mg | CHEWABLE_TABLET | ORAL | Status: AC
  Administered 2017-08-31: 324 mg via ORAL
  Filled 2017-08-30: qty 4

## 2017-08-30 MED ORDER — ATORVASTATIN CALCIUM 40 MG PO TABS
40.0000 mg | ORAL_TABLET | Freq: Every evening | ORAL | Status: DC
  Administered 2017-08-31: 40 mg via ORAL

## 2017-08-30 MED ORDER — ASPIRIN EC 81 MG PO TBEC
81.0000 mg | DELAYED_RELEASE_TABLET | Freq: Every day | ORAL | Status: DC
  Administered 2017-08-31 – 2017-09-01 (×2): 81 mg via ORAL
  Filled 2017-08-30 (×3): qty 1

## 2017-08-30 MED ORDER — PNEUMOCOCCAL VAC POLYVALENT 25 MCG/0.5ML IJ INJ
0.5000 mL | INJECTION | INTRAMUSCULAR | Status: AC
  Administered 2017-08-31: 0.5 mL via INTRAMUSCULAR
  Filled 2017-08-30: qty 0.5

## 2017-08-30 MED ORDER — SODIUM CHLORIDE 0.9 % IV SOLN
INTRAVENOUS | Status: DC
  Administered 2017-08-30 – 2017-09-01 (×4): via INTRAVENOUS

## 2017-08-30 MED ORDER — LOSARTAN POTASSIUM 50 MG PO TABS
50.0000 mg | ORAL_TABLET | Freq: Every morning | ORAL | Status: DC
  Administered 2017-08-31 – 2017-09-01 (×2): 50 mg via ORAL
  Filled 2017-08-30 (×2): qty 1

## 2017-08-30 MED ORDER — ONDANSETRON HCL 4 MG/2ML IJ SOLN: 4.0000 mg | Freq: Four times a day (QID) | INTRAMUSCULAR | Status: DC | PRN

## 2017-08-30 MED ORDER — NITROGLYCERIN 0.4 MG SL SUBL: 0.4000 mg | SUBLINGUAL_TABLET | SUBLINGUAL | Status: DC | PRN

## 2017-08-30 MED ORDER — ASPIRIN 300 MG RE SUPP: 300.0000 mg | RECTAL | Status: AC

## 2017-08-30 NOTE — ED Notes (Signed)
Pt ambulated around nurses station with a steady gait and no complaints of dizziness.

## 2017-08-30 NOTE — ED Provider Notes (Addendum)
[facility] EMERGENCY DEPARTMENT Provider Note   CSN: [id number] Arrival date & time: 08/30/17  1134     History   Chief Complaint Chief Complaint  Patient presents with  . Loss of Consciousness  . Fall    HPI Daniel Valenzuela is a 67 y.o. male.  HPI Patient presents to the emergency room for evaluation of syncopal episodes.  Patient states yesterday he had an episode where he felt lightheaded as if he was going to pass out.  He checked his blood sugar and his blood pressure and it was fine.  This morning however when he was standing up at the kitchen he suddenly felt lightheaded as if he was going to pass out.  He did not notice that the room was spinning and he did not have any palpitations.  The next thing he remembers is waking up on the floor.  Patient was able to get up and went to the chair he had some generalized shaking of his arms but did not lose consciousness again.  He checked his blood sugar and it was elevated so he gave himself a dose of insulin and came to the emergency room for evaluation.  He is not having any chest pain.  No trouble with his vision or speech.  No focal numbness or weakness.  He does have some intermittent lightheadedness that makes him feel as if he is going to pass out.  He has had vertigo before and this does not feel like that but he does have the feeling that that might start. Past Medical History:  Diagnosis Date  . Chronic back pain   . Depression   . Diabetes mellitus   . Diabetic neuropathy (HCC)   . GERD (gastroesophageal reflux disease)   . High cholesterol   . Hypertension   . Obesity   . Rheumatoid arteritis   . Sleep apnea   . Spinal stenosis     Patient Active Problem List   Diagnosis Date Noted  . Diabetic neuropathy (HCC)   . Obesity   . Chronic back pain   . Spinal stenosis   . High cholesterol     Past Surgical History:  Procedure Laterality Date  . HERNIA REPAIR    . LAPAROSCOPIC GASTRIC BANDING    . SHOULDER  ARTHROSCOPY    . TONSILLECTOMY         Home Medications    Prior to Admission medications   Not on File    Family History No family history on file.  Social History Social History   Tobacco Use  . Smoking status: Current Some Day Smoker    Types: Cigars  . Smokeless tobacco: Never Used  Substance Use Topics  . Alcohol use: No    Frequency: Never  . Drug use: No     Allergies   Amoxicillin and Motrin [ibuprofen]   Review of Systems Review of Systems  All other systems reviewed and are negative.    Physical Exam Updated Vital Signs BP 131/66   Pulse 65   Temp 98 F (36.7 C) (Oral)   Resp (!) 8   Ht 1.803 m (5\' 11" )   Wt (!) 181.4 kg (400 lb)   SpO2 95%   BMI 55.79 kg/m   Physical Exam  Constitutional: He is oriented to person, place, and time. He appears well-developed and well-nourished. No distress.  Obese  HENT:  Head: Normocephalic and atraumatic.  Right Ear: External ear normal.  Left Ear: External ear normal.  Mouth/Throat: Oropharynx is clear and moist.  Eyes: Conjunctivae are normal. Right eye exhibits no discharge. Left eye exhibits no discharge. No scleral icterus.  Neck: Neck supple. No tracheal deviation present.  No carotid bruits  Cardiovascular: Normal rate, regular rhythm and intact distal pulses.  Murmur ( Systolic murmur that radiates into the neck bilaterally) heard. Pulmonary/Chest: Effort normal and breath sounds normal. No stridor. No respiratory distress. He has no wheezes. He has no rales.  Abdominal: Soft. Bowel sounds are normal. He exhibits no distension. There is no tenderness. There is no rebound and no guarding.  Musculoskeletal: He exhibits no edema or tenderness.  Neurological: He is alert and oriented to person, place, and time. He has normal strength. No cranial nerve deficit (no facial droop, extraocular movements intact, no slurred speech) or sensory deficit. He exhibits normal muscle tone. He displays no seizure  activity. Coordination normal.  No pronator drift bilateral upper extrem, able to hold both legs off bed for 5 seconds, sensation intact in all extremities, no visual field cuts, no left or right sided neglect, normal finger-nose exam bilaterally, no nystagmus noted   Skin: Skin is warm and dry. No rash noted.  Psychiatric: He has a normal mood and affect.  Nursing note and vitals reviewed.    ED Treatments / Results  Labs (all labs ordered are listed, but only abnormal results are displayed) Labs Reviewed  BASIC METABOLIC PANEL - Abnormal; Notable for the following components:      Result Value   Chloride 99 (*)    Glucose, Bld 151 (*)    All other components within normal limits  CBC WITH DIFFERENTIAL/PLATELET - Abnormal; Notable for the following components:   Hemoglobin 12.5 (*)    All other components within normal limits  CBG MONITORING, ED - Abnormal; Notable for the following components:   Glucose-Capillary 160 (*)    All other components within normal limits  TSH  I-STAT TROPONIN, ED   EKG Normal sinus rhythm with a first-degree AV block Right bundle branch block No previous tracing  Radiology Dg Chest 2 View  Result Date: 08/30/2017 CLINICAL DATA:  Near syncopal episode yesterday. Dizziness resulting in a syncopal episode and fall this morning. The patient hit his head on the floor. EXAM: CHEST  2 VIEW COMPARISON:  01/25/2013. FINDINGS: Interval enlarged cardiac silhouette and mild prominence of the interstitial markings. The pulmonary vasculature remains mildly prominent. Small amount of linear scarring at the left lung base without significant change. Thoracic and upper lumbar spine degenerative changes. IMPRESSION: 1. Interval cardiomegaly and mild interstitial edema or chronic interstitial lung disease. 2. Mild pulmonary vascular congestion without significant change. Electronically Signed   By: Beckie Salts M.D.   On: 08/30/2017 13:36   Ct Head Wo Contrast  Result  Date: 08/30/2017 CLINICAL DATA:  Pt states he had syncopal episode while at home today, hit back of head on kitchen floor. DM, HTN EXAM: CT HEAD WITHOUT CONTRAST TECHNIQUE: Contiguous axial images were obtained from the base of the skull through the vertex without intravenous contrast. COMPARISON:  None. FINDINGS: Brain: No evidence of acute infarction, hemorrhage, hydrocephalus, extra-axial collection or mass lesion/mass effect. Vascular: No hyperdense vessel or unexpected calcification. Skull: Normal. Negative for fracture or focal lesion. Sinuses/Orbits: No acute finding. Other: None. IMPRESSION: Negative for bleed or other acute intracranial process. Electronically Signed   By: Corlis Leak M.D.   On: 08/30/2017 13:37    Procedures Procedures (including critical care time)  Medications Ordered in  ED Medications  0.9 %  sodium chloride infusion ( Intravenous Stopped 08/30/17 1517)     Initial Impression / Assessment and Plan / ED Course  I have reviewed the triage vital signs and the nursing notes.  Pertinent labs & imaging results that were available during my care of the patient were reviewed by me and considered in my medical decision making (see chart for details).  Clinical Course as of Aug 30 1524  Sat Aug 30, 2017  1516 Labs are reassuring.  EKG does show a first degree av block.  CT and CXR are negative  [JK]    Clinical Course User Index [JK] Linwood Dibbles, MD  Patient presented to the emergency room for evaluation of syncopal episodes.  EKG does show first-degree block.  Patient is asymptomatic in the emergency room.  I discussed the case with Dr Darl Householder.  I will add on a TSH and he will arrange for outpatient cardiology follow up.  Final Clinical Impressions(s) / ED Diagnoses   Final diagnoses:  Syncope, unspecified syncope type     Linwood Dibbles, MD 08/30/17 1526  Discussed case with Dr Darl Householder again.  He reviewed his EKG again with the prolonged pr interval and RBB. Will  check a TSH level.  If it is not elevated, pt will need to be admitted [facility].  He may need a pacemaker.   Linwood Dibbles, MD 08/30/17 1550  TSH is normal.  Discussed with Dr Darl Householder.  Will arrange for transfer to [facility] for admission.   Linwood Dibbles, MD 08/30/17 [phone]

## 2017-08-30 NOTE — ED Triage Notes (Signed)
Patient reports of having a near syncopal episode yesterday. States he called Dr. Ouida Sills and and was told to check sugar and BP. Patient states this morning he was standing at kitchen sink felt dizzy and woke up in floor on back. States he remember head hitting floor. Not taking blood thinners.

## 2017-08-30 NOTE — Discharge Instructions (Addendum)
Implant site care Keep incision clean and dry for 3 days. No driving until cleared to You can remove outer dressing tomorrow. Leave steri-strips (little pieces of tape) on until seen in the office for wound check appointment. Call the office [phone]) for redness, drainage, swelling, or fever.   NO DRIVING 6 MONTHS

## 2017-08-31 ENCOUNTER — Inpatient Hospital Stay (HOSPITAL_COMMUNITY): Payer: Medicare PPO

## 2017-08-31 ENCOUNTER — Other Ambulatory Visit (HOSPITAL_COMMUNITY): Payer: Medicare PPO

## 2017-08-31 DIAGNOSIS — I459 Conduction disorder, unspecified: Secondary | ICD-10-CM

## 2017-08-31 DIAGNOSIS — I10 Essential (primary) hypertension: Secondary | ICD-10-CM

## 2017-08-31 DIAGNOSIS — R9431 Abnormal electrocardiogram [ECG] [EKG]: Secondary | ICD-10-CM

## 2017-08-31 DIAGNOSIS — R42 Dizziness and giddiness: Secondary | ICD-10-CM

## 2017-08-31 DIAGNOSIS — I350 Nonrheumatic aortic (valve) stenosis: Secondary | ICD-10-CM

## 2017-08-31 DIAGNOSIS — R55 Syncope and collapse: Secondary | ICD-10-CM

## 2017-08-31 LAB — BASIC METABOLIC PANEL
Anion gap: 11 (ref 5–15)
BUN: 6 mg/dL (ref 6–20)
CHLORIDE: 103 mmol/L (ref 101–111)
CO2: 26 mmol/L (ref 22–32)
CREATININE: 0.61 mg/dL (ref 0.61–1.24)
Calcium: 8.6 mg/dL — ABNORMAL LOW (ref 8.9–10.3)
GFR calc Af Amer: >60 mL/min (ref >60)
GFR calc non Af Amer: >60 mL/min (ref >60)
Glucose, Bld: 94 mg/dL (ref 65–99)
POTASSIUM: 3.7 mmol/L (ref 3.5–5.1)
Sodium: 140 mmol/L (ref 135–145)

## 2017-08-31 LAB — HEMOGLOBIN A1C
HEMOGLOBIN A1C: 9.4 % — AB (ref 4.8–5.6)
MEAN PLASMA GLUCOSE: 223.08 mg/dL

## 2017-08-31 LAB — GLUCOSE, CAPILLARY
GLUCOSE-CAPILLARY: 148 mg/dL — AB (ref 65–99)
Glucose-Capillary: 105 mg/dL — ABNORMAL HIGH (ref 65–99)
Glucose-Capillary: 213 mg/dL — ABNORMAL HIGH (ref 65–99)
Glucose-Capillary: 228 mg/dL — ABNORMAL HIGH (ref 65–99)

## 2017-08-31 LAB — CBC
HEMATOCRIT: 39.7 % (ref 39.0–52.0)
HEMOGLOBIN: 12.9 g/dL — AB (ref 13.0–17.0)
MCH: 27.9 pg (ref 26.0–34.0)
MCHC: 32.5 g/dL (ref 30.0–36.0)
MCV: 85.9 fL (ref 78.0–100.0)
Platelets: 228 10*3/uL (ref 150–400)
RBC: 4.62 MIL/uL (ref 4.22–5.81)
RDW: 15.1 % (ref 11.5–15.5)
WBC: 8.8 10*3/uL (ref 4.0–10.5)

## 2017-08-31 LAB — MRSA PCR SCREENING: MRSA BY PCR: NEGATIVE

## 2017-08-31 LAB — TROPONIN I
Troponin I: 0.03 ng/mL (ref <0.03)
Troponin I: 0.03 ng/mL (ref <0.03)
Troponin I: 0.03 ng/mL (ref <0.03)

## 2017-08-31 LAB — HIV ANTIBODY (ROUTINE TESTING W REFLEX): HIV Screen 4th Generation wRfx: NONREACTIVE

## 2017-08-31 MED ORDER — INSULIN ASPART 100 UNIT/ML ~~LOC~~ SOLN
0.0000 [IU] | Freq: Three times a day (TID) | SUBCUTANEOUS | Status: DC
  Administered 2017-08-31: 2 [IU] via SUBCUTANEOUS
  Administered 2017-08-31: 5 [IU] via SUBCUTANEOUS
  Administered 2017-09-01: 3 [IU] via SUBCUTANEOUS
  Administered 2017-09-01: 2 [IU] via SUBCUTANEOUS

## 2017-08-31 MED ORDER — GABAPENTIN 600 MG PO TABS
1200.0000 mg | ORAL_TABLET | Freq: Every day | ORAL | Status: DC
  Administered 2017-08-31 (×2): 1200 mg via ORAL
  Filled 2017-08-31 (×2): qty 2

## 2017-08-31 MED ORDER — PERFLUTREN LIPID MICROSPHERE
1.0000 mL | INTRAVENOUS | Status: AC | PRN
  Administered 2017-08-31: 4 mL via INTRAVENOUS
  Filled 2017-08-31: qty 10

## 2017-08-31 NOTE — Progress Notes (Signed)
  Echocardiogram 2D Echocardiogram has been performed.  Delcie Roch 08/31/2017, 5:04 PM

## 2017-08-31 NOTE — Consult Note (Addendum)
Cardiology Consultation:   Patient ID: Daniel Valenzuela; [id number]; 08/10/1950   Admit date: 08/30/2017 Date of Consult: 08/31/2017  Primary Care Provider: Carylon Perches, MD Primary Cardiologist: No primary care provider on file. none Primary Electrophysiologist:  none   Patient Profile:   Daniel Valenzuela is a 67 y.o. male with a hx of dizziness who is being seen today for the evaluation of syncope at the request of Dr. Darl Householder.  History of Present Illness:   Daniel Valenzuela is a pleasant morbidly obese 67 yo man with a h/o HTN, DM, obesity, chronic back pain who was admitted for evaluation of syncope. He has RBBB and first degree AV block. He was on Verapamil. He suddenly passed out while standing. He thinks he was awake when he hit the ground and was unconscious for only a few seconds. He does not have angina. He is fairly sedentary due to back problems and obesity. He has a h/o Lap band bariatric surgery.   Past Medical History:  Diagnosis Date  . Chronic back pain   . Depression   . Diabetes mellitus   . Diabetic neuropathy (HCC)   . GERD (gastroesophageal reflux disease)   . High cholesterol   . Hypertension   . Obesity   . Rheumatoid arteritis   . Sleep apnea   . Spinal stenosis     Past Surgical History:  Procedure Laterality Date  . HERNIA REPAIR    . LAPAROSCOPIC GASTRIC BANDING    . SHOULDER ARTHROSCOPY    . TONSILLECTOMY       Home Medications:  Prior to Admission medications   Medication Sig Start Date End Date Taking? Authorizing Provider  atorvastatin (LIPITOR) 40 MG tablet Take 40 mg by mouth every evening.   Yes [provider]  Coenzyme Q10 (CO Q 10) 100 MG CAPS Take 1 capsule by mouth at bedtime.   Yes [provider]  Cyanocobalamin (B-12) 5000 MCG CAPS Take 1 capsule by mouth every morning.   Yes [provider]  escitalopram (LEXAPRO) 10 MG tablet Take 10 mg by mouth every evening.   Yes [provider]  famotidine  (PEPCID) 20 MG tablet Take 20 mg by mouth at bedtime.   Yes [provider]  furosemide (LASIX) 40 MG tablet Take 40 mg by mouth every morning.   Yes [provider]  gabapentin (NEURONTIN) 600 MG tablet Take 600-1,200 mg by mouth 2 (two) times daily. 600mg  in the morning and 1200mg  at bedtime   Yes [provider]  HYDROcodone-acetaminophen (NORCO/VICODIN) 5-325 MG tablet Take 1 tablet by mouth every 4 (four) hours as needed for moderate pain.   Yes [provider]  Insulin Glargine (LANTUS SOLOSTAR) 100 UNIT/ML Solostar Pen Inject 50 Units into the skin daily at 10 pm.   Yes [provider]  Insulin Lispro Prot & Lispro (HUMALOG MIX 75/25 KWIKPEN) (75-25) 100 UNIT/ML Kwikpen Inject 10 Units into the skin 3 (three) times daily before meals.   Yes [provider]  losartan (COZAAR) 100 MG tablet Take 50 mg by mouth every morning.   Yes [provider]  Magnesium 500 MG CAPS Take 1 capsule by mouth at bedtime.   Yes [provider]  Multiple Minerals-Vitamins (CAL-MAG-ZINC-D PO) Take 1 tablet by mouth daily.   Yes [provider]  Multiple Vitamin (MULTIVITAMIN WITH MINERALS) TABS tablet Take 1 tablet by mouth every morning.   Yes [provider]  pantoprazole (PROTONIX) 40 MG tablet Take 40  mg by mouth every morning.   Yes [provider]  verapamil (CALAN-SR) 240 MG CR tablet Take 240 mg by mouth every morning.   Yes [provider]    Inpatient Medications: Scheduled Meds: . aspirin EC  81 mg Oral Daily  . atorvastatin  40 mg Oral QPM  . furosemide  40 mg Oral q morning - 10a  . gabapentin  1,200 mg Oral QHS  . gabapentin  600 mg Oral Daily  . heparin  5,000 Units Subcutaneous Q8H  . insulin aspart  0-15 Units Subcutaneous TID WC  . losartan  50 mg Oral q morning - 10a   Continuous Infusions: . sodium chloride 50 mL/hr at 08/31/17 0005   PRN Meds: acetaminophen, nitroGLYCERIN,  ondansetron (ZOFRAN) IV  Allergies:    Allergies  Allergen Reactions  . Methyldopa-Hydrochlorothiazide Other (See Comments)    GYNOCOMASTIA  . Amoxicillin     rash  . Motrin [Ibuprofen]     Chest swelling    Social History:   Social History   Socioeconomic History  . Marital status: Married    Spouse name: Not on file  . Number of children: Not on file  . Years of education: Not on file  . Highest education level: Not on file  Social Needs  . Financial resource strain: Not on file  . Food insecurity - worry: Not on file  . Food insecurity - inability: Not on file  . Transportation needs - medical: Not on file  . Transportation needs - non-medical: Not on file  Occupational History  . Not on file  Tobacco Use  . Smoking status: Current Some Day Smoker    Types: Cigars  . Smokeless tobacco: Never Used  Substance and Sexual Activity  . Alcohol use: No    Frequency: Never  . Drug use: No  . Sexual activity: Not on file  Other Topics Concern  . Not on file  Social History Narrative  . Not on file    Family History:   No premature CAD. Positive for HTN and DM    ROS:  Please see the history of present illness.  See above.  All other ROS reviewed and negative.     Physical Exam/Data:   Vitals:   08/30/17 1930 08/30/17 2000 08/30/17 2106 08/31/17 0640  BP: (!) 148/84 135/71 (!) 157/65 131/65  Pulse: 68 67 74 87  Resp: 15 15 17 18   Temp:   97.7 F (36.5 C) 97.9 F (36.6 C)  TempSrc:   Oral Oral  SpO2: 99% 99% 97%   Weight:   (!) 382 lb 6.4 oz (173.5 kg) (!) 378 lb 1.6 oz (171.5 kg)  Height:        Intake/Output Summary (Last 24 hours) at 08/31/2017 0928 Last data filed at 08/31/2017 0600 Gross per 24 hour  Intake 1094.17 ml  Output 1300 ml  Net -205.83 ml   Filed Weights   08/30/17 1141 08/30/17 2106 08/31/17 0640  Weight: (!) 400 lb (181.4 kg) (!) 382 lb 6.4 oz (173.5 kg) (!) 378 lb 1.6 oz (171.5 kg)   Body mass index is 52.73 kg/m.  General:   Well nourished, well developed, in no acute distress, massively obese HEENT: normal Lymph: no adenopathy Neck: unable to assess JVD Endocrine:  No thryomegaly Vascular: No carotid bruits; FA pulses 2+ bilaterally without bruits  Cardiac:  normal S1, S2; RRR; 2/6 systolic murmur of AS, heart sounds distant.  Lungs:  clear to auscultation bilaterally, no  wheezing, rhonchi or rales  Abd: soft, obese, nontender, no hepatomegaly  Ext: no edema Musculoskeletal:  No deformities, BUE and BLE strength normal and equal Skin: warm and dry  Neuro:  CNs 2-12 intact, no focal abnormalities noted Psych:  Normal affect   EKG:  The EKG was personally reviewed and demonstrates:  nsr with marked first degree AV block and RBBB  Telemetry:  Telemetry was personally reviewed and demonstrates:  NSR with no pauses  Relevant CV Studies: Echo is pending  Laboratory Data:  Chemistry Recent Labs  Lab 08/30/17 1237 08/31/17 0003  NA 139 140  K 4.2 3.7  CL 99* 103  CO2 30 26  GLUCOSE 151* 94  BUN 12 6  CREATININE 0.64 0.61  CALCIUM 9.0 8.6*  GFRNONAA >60 >60  GFRAA >60 >60  ANIONGAP 10 11    No results for input(s): PROT, ALBUMIN, AST, ALT, ALKPHOS, BILITOT in the last 168 hours. Hematology Recent Labs  Lab 08/30/17 1237 08/31/17 0003  WBC 7.6 8.8  RBC 4.52 4.62  HGB 12.5* 12.9*  HCT 39.3 39.7  MCV 86.9 85.9  MCH 27.7 27.9  MCHC 31.8 32.5  RDW 14.5 15.1  PLT 198 228   Cardiac Enzymes Recent Labs  Lab 08/31/17 0003 08/31/17 0618  TROPONINI 0.03* 0.03*    Recent Labs  Lab 08/30/17 1259  TROPIPOC 0.02    BNPNo results for input(s): BNP, PROBNP in the last 168 hours.  DDimer No results for input(s): DDIMER in the last 168 hours.  Radiology/Studies:  Dg Chest 2 View  Result Date: 08/30/2017 CLINICAL DATA:  Near syncopal episode yesterday. Dizziness resulting in a syncopal episode and fall this morning. The patient hit his head on the floor. EXAM: CHEST  2 VIEW COMPARISON:   01/25/2013. FINDINGS: Interval enlarged cardiac silhouette and mild prominence of the interstitial markings. The pulmonary vasculature remains mildly prominent. Small amount of linear scarring at the left lung base without significant change. Thoracic and upper lumbar spine degenerative changes. IMPRESSION: 1. Interval cardiomegaly and mild interstitial edema or chronic interstitial lung disease. 2. Mild pulmonary vascular congestion without significant change. Electronically Signed   By: Beckie Salts M.D.   On: 08/30/2017 13:36   Ct Head Wo Contrast  Result Date: 08/30/2017 CLINICAL DATA:  Pt states he had syncopal episode while at home today, hit back of head on kitchen floor. DM, HTN EXAM: CT HEAD WITHOUT CONTRAST TECHNIQUE: Contiguous axial images were obtained from the base of the skull through the vertex without intravenous contrast. COMPARISON:  None. FINDINGS: Brain: No evidence of acute infarction, hemorrhage, hydrocephalus, extra-axial collection or mass lesion/mass effect. Vascular: No hyperdense vessel or unexpected calcification. Skull: Normal. Negative for fracture or focal lesion. Sinuses/Orbits: No acute finding. Other: None. IMPRESSION: Negative for bleed or other acute intracranial process. Electronically Signed   By: Corlis Leak M.D.   On: 08/30/2017 13:37    Assessment and Plan:   1. Syncope - most likely due to transient CHB secondary to conduction system disease and verapamil. His verapamil has been held. No more symptoms. We will watch on tele today. If no pauses, will consider ILR tomorrow. I suspect he will ultimately have progressive conduction system disease and require PPM but at this point, he will need to have evidence of more heart block off of verapamil. 2. HTN - his pressure is relatively well controlled. Might consider using amlodipine at DC in place of verapamil. 3. Obesity - I discussed the importance of weight loss.  4. Conduction system disease - he has marked  conduction system disease but is asymptomatic except for the episode above. He will need to be followed closely.  5. Aortic stenosis - his mean gradient 2 years ago was 12. I suspect his valve is worse now based on his exam. Await repeat echo.   For questions or updates, please contact [facility] Please consult [email or web address] for contact info under Cardiology/STEMI.   Signed, Lewayne Bunting, MD  08/31/2017 9:28 AM

## 2017-08-31 NOTE — Consult Note (Signed)
Cardiology Consultation:   Patient ID: Daniel Valenzuela; [id number]; Feb 15, 1951   Admit date: 08/30/2017 Date of Consult: 08/31/2017  Primary Care Provider: Carylon Perches, MD Primary Cardiologist: No primary care provider on file.  Primary Electrophysiologist:     Patient Profile:   Daniel Valenzuela is a 67 y.o. male with a hx of  DM , HTN who is being seen today for the evaluation of  Abnl EKG, dizziness, RBBB, First degree  AV  Block  at the request of  [facility] hospitalists  History of Present Illness:   Mr.   Daniel Valenzuela is a 67 y.o. male.  HPI Patient presents to the emergency room for evaluation of syncopal episodes.  Patient states yesterday he had an episode where he felt lightheaded as if he was going to pass out.  He checked his blood sugar and his blood pressure and it was fine.  This morning however when he was standing up at the kitchen he suddenly felt lightheaded as if he was going to pass out.  He did not notice that the room was spinning and he did not have any palpitations.  The next thing he remembers is waking up on the floor.  Patient was able to get up and went to the chair he had some generalized shaking of his arms but did not lose consciousness again.  He checked his blood sugar and it was elevated so he gave himself a dose of insulin and came to the emergency room for evaluation.  He is not having any chest pain.  No trouble with his vision or speech.  No focal numbness or weakness.  He does have some intermittent lightheadedness that makes him feel as if he is going to pass out.  He has had vertigo before and this does not feel like that but he does have the feeling that that might start.  He  Was  Accepted as  A patietn transfer  From [facility]   For  Further  EP eval of  First degree AV block , RBBB, for PPM eval.   Past Medical History:  Diagnosis Date  . Chronic back pain   . Depression   . Diabetes mellitus   . Diabetic neuropathy (HCC)   . GERD (gastroesophageal  reflux disease)   . High cholesterol   . Hypertension   . Obesity   . Rheumatoid arteritis   . Sleep apnea   . Spinal stenosis     Past Surgical History:  Procedure Laterality Date  . HERNIA REPAIR    . LAPAROSCOPIC GASTRIC BANDING    . SHOULDER ARTHROSCOPY    . TONSILLECTOMY       Home Medications:  Prior to Admission medications   Medication Sig Start Date End Date Taking? Authorizing Provider  atorvastatin (LIPITOR) 40 MG tablet Take 40 mg by mouth every evening.   Yes [provider]  Coenzyme Q10 (CO Q 10) 100 MG CAPS Take 1 capsule by mouth at bedtime.   Yes [provider]  Cyanocobalamin (B-12) 5000 MCG CAPS Take 1 capsule by mouth every morning.   Yes [provider]  escitalopram (LEXAPRO) 10 MG tablet Take 10 mg by mouth every evening.   Yes [provider]  famotidine (PEPCID) 20 MG tablet Take 20 mg by mouth at bedtime.   Yes [provider]  furosemide (LASIX) 40 MG tablet Take 40 mg by mouth every morning.   Yes [provider]  gabapentin (NEURONTIN) 600  MG tablet Take 600-1,200 mg by mouth 2 (two) times daily. 600mg  in the morning and 1200mg  at bedtime   Yes [provider]  HYDROcodone-acetaminophen (NORCO/VICODIN) 5-325 MG tablet Take 1 tablet by mouth every 4 (four) hours as needed for moderate pain.   Yes [provider]  Insulin Glargine (LANTUS SOLOSTAR) 100 UNIT/ML Solostar Pen Inject 50 Units into the skin daily at 10 pm.   Yes [provider]  Insulin Lispro Prot & Lispro (HUMALOG MIX 75/25 KWIKPEN) (75-25) 100 UNIT/ML Kwikpen Inject 10 Units into the skin 3 (three) times daily before meals.   Yes [provider]  losartan (COZAAR) 100 MG tablet Take 50 mg by mouth every morning.   Yes [provider]  Magnesium 500 MG CAPS Take 1 capsule by mouth at bedtime.   Yes [provider]  Multiple Minerals-Vitamins (CAL-MAG-ZINC-D PO) Take 1 tablet by mouth  daily.   Yes [provider]  Multiple Vitamin (MULTIVITAMIN WITH MINERALS) TABS tablet Take 1 tablet by mouth every morning.   Yes [provider]  pantoprazole (PROTONIX) 40 MG tablet Take 40 mg by mouth every morning.   Yes [provider]  verapamil (CALAN-SR) 240 MG CR tablet Take 240 mg by mouth every morning.   Yes [provider]    Inpatient Medications: Scheduled Meds: . aspirin EC  81 mg Oral Daily  . atorvastatin  40 mg Oral QPM  . furosemide  40 mg Oral q morning - 10a  . gabapentin  1,200 mg Oral QHS  . gabapentin  600 mg Oral Daily  . heparin  5,000 Units Subcutaneous Q8H  . losartan  50 mg Oral q morning - 10a  . pneumococcal 23 valent vaccine  0.5 mL Intramuscular Tomorrow-1000   Continuous Infusions: . sodium chloride 50 mL/hr at 08/31/17 0005   PRN Meds: acetaminophen, nitroGLYCERIN, ondansetron (ZOFRAN) IV  Allergies:    Allergies  Allergen Reactions  . Methyldopa-Hydrochlorothiazide Other (See Comments)    GYNOCOMASTIA  . Amoxicillin     rash  . Motrin [Ibuprofen]     Chest swelling    Social History:   Social History   Socioeconomic History  . Marital status: Married    Spouse name: Not on file  . Number of children: Not on file  . Years of education: Not on file  . Highest education level: Not on file  Social Needs  . Financial resource strain: Not on file  . Food insecurity - worry: Not on file  . Food insecurity - inability: Not on file  . Transportation needs - medical: Not on file  . Transportation needs - non-medical: Not on file  Occupational History  . Not on file  Tobacco Use  . Smoking status: Current Some Day Smoker    Types: Cigars  . Smokeless tobacco: Never Used  Substance and Sexual Activity  . Alcohol use: No    Frequency: Never  . Drug use: No  . Sexual activity: Not on file  Other Topics Concern  . Not on file  Social History Narrative  . Not on file    Family History:   No  family history on file.   ROS:  Please see the history of present illness.   All other ROS reviewed and negative.   Except slight  dizziness  Physical Exam/Data:   Vitals:   08/30/17 1900 08/30/17 1930 08/30/17 2000 08/30/17 2106  BP: 139/76 (!) 148/84 135/71 (!) 157/65  Pulse: 70 68 67  74  Resp: 14 15 15 17   Temp:    97.7 F (36.5 C)  TempSrc:    Oral  SpO2: 98% 99% 99% 97%  Weight:    (!) 382 lb 6.4 oz (173.5 kg)  Height:        Intake/Output Summary (Last 24 hours) at 08/31/2017 0021 Last data filed at 08/30/2017 2330 Gross per 24 hour  Intake 544.17 ml  Output 600 ml  Net -55.83 ml   Filed Weights   08/30/17 1141 08/30/17 2106  Weight: (!) 400 lb (181.4 kg) (!) 382 lb 6.4 oz (173.5 kg)   Body mass index is 53.33 kg/m.  General:  Well nourished, well developed, in no acute distress HEENT: normal Lymph: no adenopathy Neck: no JVD Endocrine:  No thryomegaly Vascular: No carotid bruits; FA pulses 2+ bilaterally without bruits  Cardiac:  normal S1, S2; RRR;   Systolic  Murmur, possible TR  And MR,  Lungs:  clear to auscultation bilaterally, no wheezing, rhonchi or rales  Abd: soft, nontender, no hepatomegaly  Ext: no edema Musculoskeletal:  No deformities, BUE and BLE strength normal and equal Skin: warm and dry  Neuro:  CNs 2-12 intact, no focal abnormalities noted Psych:  Normal affect   EKG:  The EKG was personally reviewed and demonstrates:   NSR, FIRST DEGEE  AV  BLOCK , RBBB Telemetry:  Telemetry was personally reviewed and demonstrates:    NSR   Relevant CV Studies: EKG  AT  [facility] AS  ABOVE  ECHO  ORDERED  Laboratory Data:  Chemistry Recent Labs  Lab 08/30/17 1237  NA 139  K 4.2  CL 99*  CO2 30  GLUCOSE 151*  BUN 12  CREATININE 0.64  CALCIUM 9.0  GFRNONAA >60  GFRAA >60  ANIONGAP 10    No results for input(s): PROT, ALBUMIN, AST, ALT, ALKPHOS, BILITOT in the last 168 hours. Hematology Recent Labs  Lab 08/30/17 1237  WBC 7.6  RBC  4.52  HGB 12.5*  HCT 39.3  MCV 86.9  MCH 27.7  MCHC 31.8  RDW 14.5  PLT 198   Cardiac EnzymesNo results for input(s): TROPONINI in the last 168 hours.  Recent Labs  Lab 08/30/17 1259  TROPIPOC 0.02    BNPNo results for input(s): BNP, PROBNP in the last 168 hours.  DDimer No results for input(s): DDIMER in the last 168 hours.  Radiology/Studies:  Dg Chest 2 View  Result Date: 08/30/2017 CLINICAL DATA:  Near syncopal episode yesterday. Dizziness resulting in a syncopal episode and fall this morning. The patient hit his head on the floor. EXAM: CHEST  2 VIEW COMPARISON:  01/25/2013. FINDINGS: Interval enlarged cardiac silhouette and mild prominence of the interstitial markings. The pulmonary vasculature remains mildly prominent. Small amount of linear scarring at the left lung base without significant change. Thoracic and upper lumbar spine degenerative changes. IMPRESSION: 1. Interval cardiomegaly and mild interstitial edema or chronic interstitial lung disease. 2. Mild pulmonary vascular congestion without significant change. Electronically Signed   By: 01/27/2013 M.D.   On: 08/30/2017 13:36   Ct Head Wo Contrast  Result Date: 08/30/2017 CLINICAL DATA:  Pt states he had syncopal episode while at home today, hit back of head on kitchen floor. DM, HTN EXAM: CT HEAD WITHOUT CONTRAST TECHNIQUE: Contiguous axial images were obtained from the base of the skull through the vertex without intravenous contrast. COMPARISON:  None. FINDINGS: Brain: No evidence of acute infarction, hemorrhage, hydrocephalus, extra-axial collection or mass lesion/mass effect.  Vascular: No hyperdense vessel or unexpected calcification. Skull: Normal. Negative for fracture or focal lesion. Sinuses/Orbits: No acute finding. Other: None. IMPRESSION: Negative for bleed or other acute intracranial process. Electronically Signed   By: Corlis Leak M.D.   On: 08/30/2017 13:37    Assessment and Plan:   DIZZINESS ABNL  EKG RBBB FIRST DEGREEE AV  BLOCK  HTN  DM     Pt  Hemodynamically  Stable , Vitals  110/70 HR 68 BPM   Due  To dizziness and abnl  EKG  - Pt admitted for EP eval for PPM eval    Will inform EP team    No active need for dopamine  Or  Any Pacing at  This  Time   Will continue to observe   For questions or updates, please contact [facility] Please consult [email or web address] for contact info under Cardiology/STEMI.   Signed, Lynwood Dawley, MD  08/31/2017 12:21 AM

## 2017-09-01 ENCOUNTER — Other Ambulatory Visit: Payer: Self-pay

## 2017-09-01 ENCOUNTER — Encounter (HOSPITAL_COMMUNITY)
Admission: EM | Disposition: A | Discharge status: Home / Self Care | Payer: Self-pay | Source: Home / Self Care | Attending: Cardiovascular Disease

## 2017-09-01 DIAGNOSIS — R55 Syncope and collapse: Secondary | ICD-10-CM

## 2017-09-01 HISTORY — PX: LOOP RECORDER INSERTION: EP1214

## 2017-09-01 LAB — GLUCOSE, CAPILLARY
GLUCOSE-CAPILLARY: 181 mg/dL — AB (ref 65–99)
Glucose-Capillary: 136 mg/dL — ABNORMAL HIGH (ref 65–99)
Glucose-Capillary: 139 mg/dL — ABNORMAL HIGH (ref 65–99)

## 2017-09-01 LAB — ECHOCARDIOGRAM COMPLETE
AO mean calculated velocity dopler: 180 cm/s
AOPV: 0.34 m/s
AV Area VTI index: 0.67 cm2/m2
AV Area VTI: 1.81 cm2
AV Mean grad: 15 mmHg
AV VEL mean LVOT/AV: 0.31
AV area mean vel ind: 0.6 cm2/m2
AV peak Index: 0.66
AVA: 1.84 cm2
AVAREAMEANV: 1.65 cm2
AVPG: 26 mmHg
AVPKVEL: 254 cm/s
CHL CUP AV VEL: 1.84
FS: 18 % — AB (ref 28–44)
HEIGHTINCHES: 71 in
IVS/LV PW RATIO, ED: 0.92
LA diam end sys: 45 mm
LA vol index: 34.7 mL/m2
LA vol: 95.9 mL
LADIAMINDEX: 1.63 cm/m2
LASIZE: 45 mm
LAVOLA4C: 89 mL
LV TDI E'LATERAL: 14.5
LV TDI E'MEDIAL: 13.5
LVELAT: 14.5 cm/s
LVOT VTI: 17.7 cm
LVOT area: 5.31 cm2
LVOT diameter: 26 mm
LVOT peak vel: 86.8 cm/s
LVOTSV: 94 mL
LVOTVTI: 0.35 cm
Lateral S' vel: 13.7 cm/s
PW: 13 mm — AB (ref 0.6–1.1)
RV TAPSE: 23.1 mm
VTI: 51.2 cm
Valve area index: 0.67
WEIGHTICAEL: 6049.6 [oz_av]

## 2017-09-01 SURGERY — LOOP RECORDER INSERTION

## 2017-09-01 MED ORDER — LIDOCAINE-EPINEPHRINE 1 %-1:100000 IJ SOLN
INTRAMUSCULAR | Status: AC
  Filled 2017-09-01: qty 1

## 2017-09-01 MED ORDER — LIDOCAINE HCL (PF) 1 % IJ SOLN
INTRAMUSCULAR | Status: DC | PRN
  Administered 2017-09-01: 30 mL via INTRADERMAL

## 2017-09-01 MED ORDER — AMLODIPINE BESYLATE 5 MG PO TABS: 5.0000 mg | ORAL_TABLET | Freq: Every day | ORAL | 11 refills | Status: DC

## 2017-09-01 SURGICAL SUPPLY — 2 items
LOOP REVEAL LINQSYS (Prosthesis & Implant Heart) ×3 IMPLANT
PACK LOOP INSERTION (CUSTOM PROCEDURE TRAY) ×3 IMPLANT

## 2017-09-01 NOTE — Discharge Summary (Signed)
ELECTROPHYSIOLOGY PROCEDURE DISCHARGE SUMMARY    Patient ID: Daniel Valenzuela,  MRN: [id number], DOB/AGE: 1951/07/13 67 y.o.  Admit date: 08/30/2017 Discharge date: 09/01/2017  Primary Care Physician: Carylon Perches, MD  Primary Cardiologist: new to [facility] Electrophysiologist: new this admission, Dr. Ladona Ridgel  Primary Discharge Diagnosis:  1. Syncope 2. Conduction system disease  Secondary Discharge Diagnosis:  1. HTN  Allergies  Allergen Reactions  . Methyldopa-Hydrochlorothiazide Other (See Comments)    GYNOCOMASTIA  . Amoxicillin     rash  . Motrin [Ibuprofen]     Chest swelling     Procedures This Admission:  1.  Implantation of a loop recorder 09/01/17, Dr. Graciela Husbands  Brief HPI: Daniel Valenzuela is a 67 y.o. male with PMHx of HTN, DM, obesity, chronic back pain, was admitted to [facility] after a syncopal episode, initially at [facility]. The patient reported the day prior he had an episode where he felt lightheaded as if he was going to pass out. He checked his blood sugar and his blood pressure and it was fine. The morning of admission when he was standing up at the kitchen he suddenly felt lightheaded as if he was going to pass out. He did not notice that the room was spinning and he did not have any palpitations. The next thing he remembered was waking up on the floor, though Dr. Lubertha Basque report is that the patient though he was waking as he fell, the patient suspected only blacked out a couple seconds. He was able to get up and went to the chair he had some generalized shaking of his arms but did not lose consciousness again. He checked his blood sugar and it was elevated so he gave himself a dose of insulin and sought care at the emergency room for evaluation. He did not report any chest pain, trouble with his vision or speech, no focal numbness or weakness.    Hospital Course:  The patient was transferred to [facility], admitted, noting baseline conduction system disease, EP was called, his  verapamil stopped and monitored on telemetry.  CT head was nagative for acute process/bleed, an echo was done, LVEF 60-65%, mild LVH, no WMA.  AV noted mild stenosis with mean gradient of .  Outside of his DM uncontrolled, labs were unremarkable, , Trop I, negative x3.  Given no further symptoms or any bradycardic events, Dr. Ladona Ridgel recommended loop implant, given need to monitor closely off nodal blocking agents.  The patient was examined by Dr. Ladona Ridgel and considered stable for discharge to home.   Dr. Ladona Ridgel has encouraged supervised water aerobics to aid in weight loss efforts.  PCP follow up in 1 week has been recommended, EP follow up has been arranged.  The patient was made aware of [place] law, no driving for 6 months.  He lives in [place], and is instructed regardless, should not drive for 6 months, or until cleared to ILR site care/wound instructions were discussed with the patient.   Physical Exam: Vitals:   08/31/17 2359 09/01/17 0403 09/01/17 0744 09/01/17 1220  BP: (!) 114/45 135/76 128/78 126/70  Pulse: 80 72 70 72  Resp: 16 17 18 19   Temp: 99.3 F (37.4 C) 97.7 F (36.5 C) 98 F (36.7 C) 97.8 F (36.6 C)  TempSrc: Axillary Oral Oral Oral  SpO2: 97% 98% 99% 97%  Weight:  (!) 378 lb 6.4 oz (171.6 kg)    Height:         Labs:   Lab  Results  Component Value Date   WBC 8.8 08/31/2017   HGB 12.9 (L) 08/31/2017   HCT 39.7 08/31/2017   MCV 85.9 08/31/2017   PLT 228 08/31/2017    Recent Labs  Lab 08/31/17 0003  NA 140  K 3.7  CL 103  CO2 26  BUN 6  CREATININE 0.61  CALCIUM 8.6*  GLUCOSE 94    Discharge Medications:  Allergies as of 09/01/2017      Reactions   Methyldopa-hydrochlorothiazide Other (See Comments)   GYNOCOMASTIA   Amoxicillin    rash   Motrin [ibuprofen]    Chest swelling      Medication List    STOP taking these medications   verapamil 240 MG CR tablet Commonly known as:  CALAN-SR     TAKE these medications   amLODipine 5 MG  tablet Commonly known as:  NORVASC Take 1 tablet (5 mg total) by mouth daily.   atorvastatin 40 MG tablet Commonly known as:  LIPITOR Take 40 mg by mouth every evening.   B-12 5000 MCG Caps Take 1 capsule by mouth every morning.   CAL-MAG-ZINC-D PO Take 1 tablet by mouth daily.   Co Q 10 100 MG Caps Take 1 capsule by mouth at bedtime.   escitalopram 10 MG tablet Commonly known as:  LEXAPRO Take 10 mg by mouth every evening.   famotidine 20 MG tablet Commonly known as:  PEPCID Take 20 mg by mouth at bedtime.   furosemide 40 MG tablet Commonly known as:  LASIX Take 40 mg by mouth every morning.   gabapentin 600 MG tablet Commonly known as:  NEURONTIN Take 600-1,200 mg by mouth 2 (two) times daily. 600mg  in the morning and 1200mg  at bedtime   HUMALOG MIX 75/25 KWIKPEN (75-25) 100 UNIT/ML Kwikpen Generic drug:  Insulin Lispro Prot & Lispro Inject 10 Units into the skin 3 (three) times daily before meals.   HYDROcodone-acetaminophen 5-325 MG tablet Commonly known as:  NORCO/VICODIN Take 1 tablet by mouth every 4 (four) hours as needed for moderate pain.   LANTUS SOLOSTAR 100 UNIT/ML Solostar Pen Generic drug:  Insulin Glargine Inject 50 Units into the skin daily at 10 pm.   losartan 100 MG tablet Commonly known as:  COZAAR Take 50 mg by mouth every morning.   Magnesium 500 MG Caps Take 1 capsule by mouth at bedtime.   multivitamin with minerals Tabs tablet Take 1 tablet by mouth every morning.   pantoprazole 40 MG tablet Commonly known as:  PROTONIX Take 40 mg by mouth every morning.       Disposition:  Home  Follow-up Information    [facility] [address] [facility] Follow up on 09/11/2017.   Specialty:  Cardiology Why:  2:00PM, wound check visit Contact information: [address], [address] [place] [address] [phone]       Marinus Maw, MD Follow up on 10/16/2017.   Specialty:  Cardiology Why:  11:15AM  Contact  information: [address] [place] [address] [phone]        Carylon Perches, MD. Schedule an appointment as soon as possible for a visit.   Specialty:  Internal Medicine Why:  Please see you PMD in follow up withion one week to address diabetes management and have post hospital visit. Contact information: [address] [place] [address] [phone]           Duration of Discharge Encounter: Greater than 30 minutes including physician time.  Signed,  Francis Dowse, PA-C 09/01/2017 2:53 PM  EP Attending  Patient seen and examined. Agree with above. The patient is stable for DC home, and will undergo watchful waiting now that he is off of verapamil and has an ILR in place for unexplained syncope.  Leonia Reeves.D.

## 2017-09-01 NOTE — Plan of Care (Signed)
Pt VSS with no C/O pain, uses call light appropriately, will continue to monitor.

## 2017-09-01 NOTE — Progress Notes (Signed)
Progress Note  Patient Name: Daniel Valenzuela Date of Encounter: 09/01/2017  Primary Cardiologist: new, Daniel Valenzuela is primary   Subjective   No chest pain or sob or dizzy spells.  Inpatient Medications    Scheduled Meds: . aspirin EC  81 mg Oral Daily  . atorvastatin  40 mg Oral QPM  . furosemide  40 mg Oral q morning - 10a  . gabapentin  1,200 mg Oral QHS  . gabapentin  600 mg Oral Daily  . heparin  5,000 Units Subcutaneous Q8H  . insulin aspart  0-15 Units Subcutaneous TID WC  . losartan  50 mg Oral q morning - 10a   Continuous Infusions: . sodium chloride 50 mL/hr at 09/01/17 0517   PRN Meds: acetaminophen, nitroGLYCERIN, ondansetron (ZOFRAN) IV   Vital Signs    Vitals:   08/31/17 1411 08/31/17 2001 08/31/17 2359 09/01/17 0403  BP: 128/72 (!) 141/68 (!) 114/45 135/76  Pulse: 88 80 80 72  Resp:  16 16 17   Temp: 98.7 F (37.1 C) 97.9 F (36.6 C) 99.3 F (37.4 C) 97.7 F (36.5 C)  TempSrc: Oral Oral Axillary Oral  SpO2: 94% 99% 97% 98%  Weight:    (!) 378 lb 6.4 oz (171.6 kg)  Height:        Intake/Output Summary (Last 24 hours) at 09/01/2017 10/30/2017 Last data filed at 08/31/2017 2200 Gross per 24 hour  Intake 1170 ml  Output -  Net 1170 ml   Filed Weights   08/30/17 2106 08/31/17 0640 09/01/17 0403  Weight: (!) 382 lb 6.4 oz (173.5 kg) (!) 378 lb 1.6 oz (171.5 kg) (!) 378 lb 6.4 oz (171.6 kg)    Telemetry    nsr - Personally Reviewed  ECG    nsr - Personally Reviewed  Physical Exam   GEN: morbidly obese, No acute distress.   Neck: No JVD Cardiac: RRR, no murmurs, rubs, or gallops.  Respiratory: Clear to auscultation bilaterally. GI: Soft, nontender, non-distended  MS: No edema; No deformity. Neuro:  Nonfocal  Psych: Normal affect   Labs    Chemistry Recent Labs  Lab 08/30/17 1237 08/31/17 0003  NA 139 140  K 4.2 3.7  CL 99* 103  CO2 30 26  GLUCOSE 151* 94  BUN 12 6  CREATININE 0.64 0.61  CALCIUM 9.0 8.6*  GFRNONAA >60 >60  GFRAA >60  >60  ANIONGAP 10 11     Hematology Recent Labs  Lab 08/30/17 1237 08/31/17 0003  WBC 7.6 8.8  RBC 4.52 4.62  HGB 12.5* 12.9*  HCT 39.3 39.7  MCV 86.9 85.9  MCH 27.7 27.9  MCHC 31.8 32.5  RDW 14.5 15.1  PLT 198 228    Cardiac Enzymes Recent Labs  Lab 08/31/17 0003 08/31/17 0618 08/31/17 1158  TROPONINI 0.03* 0.03* 0.03*    Recent Labs  Lab 08/30/17 1259  TROPIPOC 0.02     BNPNo results for input(s): BNP, PROBNP in the last 168 hours.   DDimer No results for input(s): DDIMER in the last 168 hours.   Radiology    Dg Chest 2 View  Result Date: 08/30/2017 CLINICAL DATA:  Near syncopal episode yesterday. Dizziness resulting in a syncopal episode and fall this morning. The patient hit his head on the floor. EXAM: CHEST  2 VIEW COMPARISON:  01/25/2013. FINDINGS: Interval enlarged cardiac silhouette and mild prominence of the interstitial markings. The pulmonary vasculature remains mildly prominent. Small amount of linear scarring at the left lung base without significant change.  Thoracic and upper lumbar spine degenerative changes. IMPRESSION: 1. Interval cardiomegaly and mild interstitial edema or chronic interstitial lung disease. 2. Mild pulmonary vascular congestion without significant change. Electronically Signed   By: Daniel Valenzuela M.D.   On: 08/30/2017 13:36   Ct Head Wo Contrast  Result Date: 08/30/2017 CLINICAL DATA:  Pt states he had syncopal episode while at home today, hit back of head on kitchen floor. DM, HTN EXAM: CT HEAD WITHOUT CONTRAST TECHNIQUE: Contiguous axial images were obtained from the base of the skull through the vertex without intravenous contrast. COMPARISON:  None. FINDINGS: Brain: No evidence of acute infarction, hemorrhage, hydrocephalus, extra-axial collection or mass lesion/mass effect. Vascular: No hyperdense vessel or unexpected calcification. Skull: Normal. Negative for fracture or focal lesion. Sinuses/Orbits: No acute finding. Other: None.  IMPRESSION: Negative for bleed or other acute intracranial process. Electronically Signed   By: Daniel Valenzuela M.D.   On: 08/30/2017 13:37    Cardiac Studies   2D echo - pending  Patient Profile     67 y.o. male admitted with syncope, conduction system disease on verapamil.   Assessment & Plan    1. Syncope - his verapamil has been stopped. He has had no bradycardia on tele. I have recommended her undergo insertion of an ILR for unexplained syncope.  2. HTN - in light of syncope, I have stopped his verapamil. He will be started on amlodipine 5 mg daily at dc. 3. Morbid obesity - I have strongly encouraged the patient to get into a pool. He has access to a pool in [place]. 4. AS - his exam would sugget moderate but not severe AS. I have ordered an echo which has not been read out yet.  5. Disp. - he is stable for DC once he gets his ILR. Followup with me in [place] office in 4-6 weeks. I will contact Dr. Ouida Valenzuela.  Daniel Valenzuela,M.D.  For questions or updates, please contact [facility] Please consult [email or web address] for contact info under Cardiology/STEMI.      Signed, Daniel Bunting, MD  09/01/2017, 8:14 AM  Patient ID: Daniel Valenzuela, male   DOB: 07-Jun-1951, 67 y.o.   MRN: [id number]

## 2017-09-02 ENCOUNTER — Encounter (HOSPITAL_COMMUNITY): Payer: Self-pay | Admitting: Internal Medicine

## 2017-09-11 ENCOUNTER — Ambulatory Visit (INDEPENDENT_AMBULATORY_CARE_PROVIDER_SITE_OTHER): Payer: Self-pay | Admitting: *Deleted

## 2017-09-11 DIAGNOSIS — I459 Conduction disorder, unspecified: Secondary | ICD-10-CM

## 2017-09-11 LAB — CUP PACEART INCLINIC DEVICE CHECK
Date Time Interrogation Session: 20190214143656
MDC IDC PG IMPLANT DT: 20190205

## 2017-09-11 NOTE — Progress Notes (Signed)
Wound check appointment. Steri-strips removed. Wound without redness or edema. Incision edges approximated, wound well healed. Battery status: Good. R-waves 0.22 mV. 1 symptom episodes-- ECG appears SR, patient states he felt a cramp in his neck. 0 tachy episodes, 0 pause episodes, 0 brady episodes. 0 AF episodes (0% burden). Monthly summary reports and ROV with GT 10/16/17.

## 2017-09-12 ENCOUNTER — Telehealth: Payer: Self-pay

## 2017-09-12 ENCOUNTER — Emergency Department (HOSPITAL_COMMUNITY)
Admission: EM | Admit: 2017-09-12 | Discharge: 2017-09-12 | Disposition: A | Discharge status: Home / Self Care | Payer: Medicare PPO | Attending: Emergency Medicine | Admitting: Emergency Medicine

## 2017-09-12 ENCOUNTER — Other Ambulatory Visit: Payer: Self-pay

## 2017-09-12 ENCOUNTER — Encounter (HOSPITAL_COMMUNITY): Payer: Self-pay

## 2017-09-12 DIAGNOSIS — F17290 Nicotine dependence, other tobacco product, uncomplicated: Secondary | ICD-10-CM | POA: Insufficient documentation

## 2017-09-12 DIAGNOSIS — Z794 Long term (current) use of insulin: Secondary | ICD-10-CM | POA: Insufficient documentation

## 2017-09-12 DIAGNOSIS — E860 Dehydration: Secondary | ICD-10-CM | POA: Insufficient documentation

## 2017-09-12 DIAGNOSIS — N179 Acute kidney failure, unspecified: Secondary | ICD-10-CM | POA: Diagnosis not present

## 2017-09-12 DIAGNOSIS — I10 Essential (primary) hypertension: Secondary | ICD-10-CM | POA: Insufficient documentation

## 2017-09-12 DIAGNOSIS — R55 Syncope and collapse: Secondary | ICD-10-CM | POA: Insufficient documentation

## 2017-09-12 DIAGNOSIS — R42 Dizziness and giddiness: Secondary | ICD-10-CM | POA: Diagnosis not present

## 2017-09-12 DIAGNOSIS — Z79899 Other long term (current) drug therapy: Secondary | ICD-10-CM | POA: Insufficient documentation

## 2017-09-12 DIAGNOSIS — E1140 Type 2 diabetes mellitus with diabetic neuropathy, unspecified: Secondary | ICD-10-CM | POA: Diagnosis not present

## 2017-09-12 LAB — BASIC METABOLIC PANEL
Anion gap: 12 (ref 5–15)
BUN: 16 mg/dL (ref 6–20)
CO2: 27 mmol/L (ref 22–32)
CREATININE: 1.36 mg/dL — AB (ref 0.61–1.24)
Calcium: 9.3 mg/dL (ref 8.9–10.3)
Chloride: 99 mmol/L — ABNORMAL LOW (ref 101–111)
GFR calc Af Amer: >60 mL/min (ref >60)
GFR, EST NON AFRICAN AMERICAN: 53 mL/min — AB (ref >60)
GLUCOSE: 180 mg/dL — AB (ref 65–99)
Potassium: 4.7 mmol/L (ref 3.5–5.1)
SODIUM: 138 mmol/L (ref 135–145)

## 2017-09-12 LAB — CBC
HEMATOCRIT: 44 % (ref 39.0–52.0)
Hemoglobin: 14.4 g/dL (ref 13.0–17.0)
MCH: 28.3 pg (ref 26.0–34.0)
MCHC: 32.7 g/dL (ref 30.0–36.0)
MCV: 86.4 fL (ref 78.0–100.0)
PLATELETS: 258 10*3/uL (ref 150–400)
RBC: 5.09 MIL/uL (ref 4.22–5.81)
RDW: 15.3 % (ref 11.5–15.5)
WBC: 11.2 10*3/uL — AB (ref 4.0–10.5)

## 2017-09-12 LAB — I-STAT TROPONIN, ED: Troponin i, poc: 0.03 ng/mL (ref 0.00–0.08)

## 2017-09-12 MED ORDER — SODIUM CHLORIDE 0.9 % IV BOLUS (SEPSIS)
1000.0000 mL | Freq: Once | INTRAVENOUS | Status: AC
  Administered 2017-09-12: 1000 mL via INTRAVENOUS

## 2017-09-12 MED ORDER — SODIUM CHLORIDE 0.9 % IV BOLUS (SEPSIS): 500.0000 mL | Freq: Once | INTRAVENOUS | Status: DC

## 2017-09-12 NOTE — ED Notes (Signed)
Pt ambulated in hall, Pt had strong and stead gait. Pt was able to walk without assistance.

## 2017-09-12 NOTE — ED Notes (Addendum)
Interrogated [employer] Loop Recorder.

## 2017-09-12 NOTE — Discharge Instructions (Signed)
You appear to be dehydrated.  Continue to drink plenty of fluids throughout the day and do not take your losartan or Lasix until you follow-up with your primary care physician on Monday.  If you experience another episode of lightheadedness, be sure to hit the button on your loop recorder.  It is important that when you feel these episodes of lightheadedness that you sit down to prevent injury when falling.

## 2017-09-12 NOTE — ED Provider Notes (Signed)
[facility] EMERGENCY DEPARTMENT Provider Note   CSN: [id number] Arrival date & time: 09/12/17  1649     History   Chief Complaint Chief Complaint  Patient presents with  . Loss of Consciousness    HPI Daniel Valenzuela is a 66 y.o. male.  Patient is a 67 year old male presenting with loss of consciousness.  PMH significant for IDT2DM with neuropathy, HTN, HLD, OSA, RA, spinal stenosis, GERD, dperession.  Patient recently admitted to hospital for syncope proximally 2 weeks ago thought to be related to cardiac etiology.  CT head negative.  Echo EF 60-65%, mild LVH, no WMA.  AV noted mild stenosis with mean gradient 15 mmHG. troponins negative.  Patient was without hypoglycemia.  Dr. Ladona Ridgel (EP) was consulted recommending outpatient LOOP recorder which was interrogated yesterday with sinus rhythm, otherwise unremarkable.  As of today, patient was doing yard work with intermittent feelings of lightheadedness which resolved with rest.  He was working on helping move equipment outside and subsequently had LOC for 8 seconds.  Patient also sustained trauma to back of head against the corner of brick house.  He does not recall passing out but does remember feeling lightheaded prior to the event consistent with his prior syncopal episodes.  Patient denies change in vision, headache, change in speech or gait, motor weakness or sensory loss.  He denies chest pain, shortness of breath, palpitations.  Patient does admit to inadequate fluid intake given his chronic lower extremity edema which remains at his baseline and history of gastric band.  He denies symptoms of dysuria, hematuria, polyuria or decreased urine baseline.  He denies volume loss including vomiting or diarrhea.  His CBGs have remained around 100 without lows.  He occasionally smokes a cigar but otherwise denies EtOH or illicit drug use.      Past Medical History:  Diagnosis Date  . Chronic back pain   . Depression   .  Diabetes mellitus   . Diabetic neuropathy (HCC)   . GERD (gastroesophageal reflux disease)   . High cholesterol   . Hypertension   . Obesity   . Rheumatoid arteritis   . Sleep apnea   . Spinal stenosis     Patient Active Problem List   Diagnosis Date Noted  . Syncope 08/30/2017  . RBBB (right bundle branch block with left anterior fascicular block) 08/30/2017  . Diabetic neuropathy (HCC)   . Obesity   . Chronic back pain   . Spinal stenosis   . High cholesterol     Past Surgical History:  Procedure Laterality Date  . HERNIA REPAIR    . LAPAROSCOPIC GASTRIC BANDING    . LOOP RECORDER INSERTION N/A 09/01/2017   Procedure: LOOP RECORDER INSERTION;  Surgeon: Duke Salvia, MD;  Location: [facility] INVASIVE CV LAB;  Service: Cardiovascular;  Laterality: N/A;  . SHOULDER ARTHROSCOPY    . TONSILLECTOMY         Home Medications    Prior to Admission medications   Medication Sig Start Date End Date Taking? Authorizing Provider  amLODipine (NORVASC) 5 MG tablet Take 1 tablet (5 mg total) by mouth daily. 09/01/17 09/01/18 Yes Sheilah Pigeon, PA-C  atorvastatin (LIPITOR) 40 MG tablet Take 40 mg by mouth every evening.   Yes [provider]  Coenzyme Q10 (CO Q 10) 100 MG CAPS Take 1 capsule by mouth at bedtime.   Yes [provider]  Cyanocobalamin (B-12) 5000 MCG CAPS Take 1 capsule by mouth every morning.  Yes [provider]  escitalopram (LEXAPRO) 10 MG tablet Take 10 mg by mouth every evening.   Yes [provider]  famotidine (PEPCID) 20 MG tablet Take 20 mg by mouth at bedtime.   Yes [provider]  furosemide (LASIX) 40 MG tablet Take 40 mg by mouth every morning.   Yes [provider]  gabapentin (NEURONTIN) 600 MG tablet Take 600-1,200 mg by mouth 2 (two) times daily. 600mg  in the morning and 1200mg  at bedtime   Yes [provider]  HYDROcodone-acetaminophen (NORCO/VICODIN) 5-325 MG tablet Take 1 tablet by mouth  every 4 (four) hours as needed for moderate pain.   Yes [provider]  Insulin Lispro Prot & Lispro (HUMALOG MIX 75/25 KWIKPEN) (75-25) 100 UNIT/ML Kwikpen Inject 40 Units into the skin 2 (two) times daily.    Yes [provider]  losartan (COZAAR) 100 MG tablet Take 50 mg by mouth every morning.   Yes [provider]  Magnesium 500 MG CAPS Take 1 capsule by mouth at bedtime.   Yes [provider]  Multiple Minerals-Vitamins (CAL-MAG-ZINC-D PO) Take 1 tablet by mouth daily.   Yes [provider]  Multiple Vitamin (MULTIVITAMIN WITH MINERALS) TABS tablet Take 1 tablet by mouth every morning.   Yes [provider]  pantoprazole (PROTONIX) 40 MG tablet Take 40 mg by mouth every morning.   Yes [provider]    Family History No family history on file.  Social History Social History   Tobacco Use  . Smoking status: Current Some Day Smoker    Types: Cigars  . Smokeless tobacco: Never Used  Substance Use Topics  . Alcohol use: No    Frequency: Never  . Drug use: No     Allergies   Methyldopa-hydrochlorothiazide; Amoxicillin; and Motrin [ibuprofen]   Review of Systems Review of Systems  Constitutional: Negative for chills and fever.  HENT: Negative for ear pain and sore throat.   Eyes: Negative for pain and visual disturbance.  Respiratory: Negative for cough and shortness of breath.   Cardiovascular: Positive for leg swelling. Negative for chest pain and palpitations.  Gastrointestinal: Negative for abdominal pain, diarrhea, nausea and vomiting.  Genitourinary: Negative for decreased urine volume, dysuria and hematuria.  Musculoskeletal: Negative for arthralgias and back pain.  Skin: Negative for color change and rash.  Neurological: Positive for light-headedness. Negative for seizures, syncope, facial asymmetry, weakness and headaches.  All other systems reviewed and are negative.    Physical Exam Updated  Vital Signs BP 111/68   Pulse 68   Temp 98.1 F (36.7 C) (Oral)   Resp 13   SpO2 96%   Physical Exam  Constitutional: He is oriented to person, place, and time. He appears well-developed and well-nourished.  HENT:  Head: Normocephalic and atraumatic.  Eyes: Conjunctivae and EOM are normal. Pupils are equal, round, and reactive to light.  Neck: Neck supple. No JVD present.  Cardiovascular: Normal rate and regular rhythm. Exam reveals no gallop and no friction rub.  Systolic murmur on upper right sternal border, 1+ pitting edema to mid shin bilaterally  Pulmonary/Chest: Effort normal and breath sounds normal. No respiratory distress. He has no wheezes. He has no rales.  Abdominal: Soft. Bowel sounds are normal. There is no tenderness.  Musculoskeletal: He exhibits no edema.  5/5 motor strength on all 4 extremities and grip strength  Neurological: He is alert and oriented to person, place, and time. No cranial nerve deficit or sensory deficit. Coordination  normal.  Skin: Skin is warm and dry.  Psychiatric: He has a normal mood and affect.  Nursing note and vitals reviewed.    ED Treatments / Results  Labs (all labs ordered are listed, but only abnormal results are displayed) Labs Reviewed  BASIC METABOLIC PANEL - Abnormal; Notable for the following components:      Result Value   Chloride 99 (*)    Glucose, Bld 180 (*)    Creatinine, Ser 1.36 (*)    GFR calc non Af Amer 53 (*)    All other components within normal limits  CBC - Abnormal; Notable for the following components:   WBC 11.2 (*)    All other components within normal limits  I-STAT TROPONIN, ED    EKG  EKG Interpretation  Date/Time:  Friday September 12 2017 16:59:53 EST Ventricular Rate:  93 PR Interval:    QRS Duration: 162 QT Interval:  426 QTC Calculation: 529 R Axis:   4 Text Interpretation:  Wide QRS rhythm Right bundle branch block No significant change since last tracing Confirmed by Gwyneth Sprout ([id number]) on 09/12/2017 5:17:33 PM       Radiology No results found.  Procedures Procedures (including critical care time)  Medications Ordered in ED Medications  sodium chloride 0.9 % bolus 1,000 mL (0 mLs Intravenous Stopped 09/12/17 2156)     Initial Impression / Assessment and Plan / ED Course  I have reviewed the triage vital signs and the nursing notes.  Pertinent labs & imaging results that were available during my care of the patient were reviewed by me and considered in my medical decision making (see chart for details).  Patient is a 67 year old male presenting with loss of consciousness.  PMH significant for IDT2DM with neuropathy, HTN, HLD, OSA, RA, spinal stenosis, GERD, dperession.  Vitals afebrile, HR 92 with BP 112/70, RR 18 at 99% room air.  EKG with wide QRS and RBBB consistent with prior without ST changes or T wave abnormalities.  Pertinent labs include CBG 180, leukocytosis 11.2, creatinine 1.36 (baseline 0.6), and i-STAT troponin 0.03.  On exam, patient is well-appearing and comfortable daughter.  He is without chest pain or shortness of breath.  Heart rate continues to remain in mid 80s with regular pulse.  Lungs are clear to auscultation bilaterally.  He does have a noticeable systolic heart murmur intensified at right upper sternal border.  There is 1+ pitting edema appreciated on lower extremities up to mid shin bilaterally mucous membranes moist.  Small superficial dry abrasion on scalp without underlying hematoma.  Complete neuro exam unremarkable.    [employer] loop recorder interrogated without findings.  Discussed with cardiology fellow who agreed that patient unlikely had arrhythmia which would have been picked up if this were the source of his syncopal episode.  Patient given 1 L NS bolus.  Orthostatic positive for drop in diastolic by 17 mmHg from lying to standing.  Patient states he feels much better following resuscitation.  MAP improved from  average 75 to now 85. Patient ambulated without difficulty. Suspect patient was dehydrated and requested patient hold losartan and lasix until he follows up with his PCP on Monday.  Patient is to increase his fluid intake.  Reviewed return precautions.  Final Clinical Impressions(s) / ED Diagnoses   Final diagnoses:  Vasovagal syncope  Acute kidney injury [facility])  Dehydration    ED Discharge Orders    None       Tadeo, Besecker, DO 09/12/17 2219  Gwyneth Sprout, MD 09/13/17 [phone]

## 2017-09-12 NOTE — ED Triage Notes (Signed)
Pt presents to the ed for a syncopal episode today, falling and hitting his head on a brick.  Pt is alert and oriented and ambulatory. States he does not feel dizzy anymore only with exertion.  Reports this just happened last week and is being seen by cardiology and had a loop recorder placed.  Pt denies any pain at this time.

## 2017-09-12 NOTE — Telephone Encounter (Signed)
Wife had left message on our office voicemail.Husband was outside, had syncopal event outside, fell and struck head. She states he feels fine now.Has loop recorder implanted and just had it interrogated and was told everything looks fine.I advised to to got to [facility] ED to be evaluated since he struck his head. Wife agrees

## 2017-09-15 NOTE — Telephone Encounter (Signed)
No episodes on LINQ. CL express sent from ED 09/12/17.

## 2017-10-01 ENCOUNTER — Ambulatory Visit (INDEPENDENT_AMBULATORY_CARE_PROVIDER_SITE_OTHER): Payer: Medicare PPO | Admitting: *Deleted

## 2017-10-01 DIAGNOSIS — I459 Conduction disorder, unspecified: Secondary | ICD-10-CM | POA: Diagnosis not present

## 2017-10-02 NOTE — Progress Notes (Signed)
[facility]link Summary Report / Loop Recorder 

## 2017-10-16 ENCOUNTER — Ambulatory Visit: Payer: Medicare PPO | Admitting: Internal Medicine

## 2017-10-16 ENCOUNTER — Encounter: Payer: Self-pay | Admitting: Internal Medicine

## 2017-10-16 VITALS — HR 93 | Ht 71.0 in | Wt 383.0 lb

## 2017-10-16 DIAGNOSIS — I459 Conduction disorder, unspecified: Secondary | ICD-10-CM

## 2017-10-16 NOTE — Patient Instructions (Signed)

## 2017-10-16 NOTE — Progress Notes (Signed)
HPI [name] returns today for ongoing followup of syncope. He is a pleasant 67 yo man with a h/o unexplained syncope as well as RBBB, HTN, obesity and dyslipidemia. He underwent insertion of an ILR several weeks ago. He experienced another syncopal episode and was taken to the ED where interrogation of his device demonstrated no brady or tachy arrhythmias.  Allergies  Allergen Reactions  . Methyldopa-Hydrochlorothiazide Other (See Comments)    GYNOCOMASTIA  . Amoxicillin     rash  . Motrin [Ibuprofen]     Chest swelling     Current Outpatient Medications  Medication Sig Dispense Refill  . amLODipine (NORVASC) 5 MG tablet Take 1 tablet (5 mg total) by mouth daily. 30 tablet 11  . atorvastatin (LIPITOR) 40 MG tablet Take 40 mg by mouth every evening.    . Coenzyme Q10 (CO Q 10) 100 MG CAPS Take 1 capsule by mouth at bedtime.    . Cyanocobalamin (B-12) 5000 MCG CAPS Take 1 capsule by mouth every morning.    . escitalopram (LEXAPRO) 10 MG tablet Take 10 mg by mouth every evening.    . famotidine (PEPCID) 20 MG tablet Take 20 mg by mouth at bedtime.    . furosemide (LASIX) 40 MG tablet Take 40 mg by mouth every morning.    . gabapentin (NEURONTIN) 600 MG tablet Take 600-1,200 mg by mouth 2 (two) times daily. 600mg  in the morning and 1200mg  at bedtime    . HYDROcodone-acetaminophen (NORCO/VICODIN) 5-325 MG tablet Take 1 tablet by mouth every 4 (four) hours as needed for moderate pain.    . Insulin Lispro Prot & Lispro (HUMALOG MIX 75/25 KWIKPEN) (75-25) 100 UNIT/ML Kwikpen Inject 40 Units into the skin 2 (two) times daily.     losartan (COZAAR) 100 MG tablet Take 50 mg by mouth every morning.    . Magnesium 500 MG CAPS Take 1 capsule by mouth at bedtime.    . Multiple Minerals-Vitamins (CAL-MAG-ZINC-D PO) Take 1 tablet by mouth daily.    . Multiple Vitamin (MULTIVITAMIN WITH MINERALS) TABS tablet Take 1 tablet by mouth every morning.    . pantoprazole (PROTONIX) 40 MG tablet  Take 40 mg by mouth every morning.     No current facility-administered medications for this visit.      Past Medical History:  Diagnosis Date  . Chronic back pain   . Depression   . Diabetes mellitus   . Diabetic neuropathy (HCC)   . GERD (gastroesophageal reflux disease)   . High cholesterol   . Hypertension   . Obesity   . Rheumatoid arteritis   . Sleep apnea   . Spinal stenosis     ROS:   All systems reviewed and negative except as noted in the HPI.   Past Surgical History:  Procedure Laterality Date  . HERNIA REPAIR    . LAPAROSCOPIC GASTRIC BANDING    . LOOP RECORDER INSERTION N/A 09/01/2017   Procedure: LOOP RECORDER INSERTION;  Surgeon: Marland Kitchen, MD;  Location: [facility] INVASIVE CV LAB;  Service: Cardiovascular;  Laterality: N/A;  . SHOULDER ARTHROSCOPY    . TONSILLECTOMY       Family History  Problem Relation Age of Onset  . Cancer Mother   . Diabetes Mother   . Parkinson's disease Father   . Heart failure Father      Social History   Socioeconomic History  . Marital status: Married    Spouse name: Not on file  .  Number of children: Not on file  . Years of education: Not on file  . Highest education level: Not on file  Occupational History  . Not on file  Social Needs  . Financial resource strain: Not on file  . Food insecurity:    Worry: Not on file    Inability: Not on file  . Transportation needs:    Medical: Not on file    Non-medical: Not on file  Tobacco Use  . Smoking status: Current Some Day Smoker    Types: Cigars  . Smokeless tobacco: Never Used  Substance and Sexual Activity  . Alcohol use: No    Frequency: Never  . Drug use: No  . Sexual activity: Not on file  Lifestyle  . Physical activity:    Days per week: Not on file    Minutes per session: Not on file  . Stress: Not on file  Relationships  . Social connections:    Talks on phone: Not on file    Gets together: Not on file    Attends religious service: Not on  file    Active member of club or organization: Not on file    Attends meetings of clubs or organizations: Not on file    Relationship status: Not on file  . Intimate partner violence:    Fear of current or ex partner: Not on file    Emotionally abused: Not on file    Physically abused: Not on file    Forced sexual activity: Not on file  Other Topics Concern  . Not on file  Social History Narrative  . Not on file     Pulse 93   Ht 5\' 11"  (1.803 m)   Wt (!) 383 lb (173.7 kg)   SpO2 96%   BMI 53.42 kg/m   Physical Exam: BP - 164/88 Well appearing obese 67 yo man, NAD HEENT: Unremarkable Neck:  6 cm JVD, no thyromegally Lymphatics:  No adenopathy Back:  No CVA tenderness Lungs:  Clear with no wheezes HEART:  Regular rate rhythm, no murmurs, no rubs, no clicks Abd:  soft, positive bowel sounds, no organomegally, no rebound, no guarding Ext:  2 plus pulses, no edema, no cyanosis, no clubbing Skin:  No rashes no nodules Neuro:  CN II through XII intact, motor grossly intact   DEVICE  Normal device function.  See PaceArt for details. No brady or tachy arrhythmias noted  Assess/Plan: 1. Syncope - despite RBBB, it appears that autonomic dysfunction with vasodepression is the likely etiology of his syncope. He did not have significant bradycardia. I discussed the importance of lying down if he feels a spell coming on. He will maintain adequate hydration. 2. Obesity - I discussed the importance of exercise and weight loss.  3. HTN - his blood pressure is elevated. He is encouraged to lose weight. I am reluctant to increase his diuretic. He needs to stay hydrated.  4. ILR - interogation of his ILR demonstrates normal function but no brady or tachy arrhythmias.  79, M.D.

## 2017-10-21 LAB — CUP PACEART INCLINIC DEVICE CHECK
Implantable Pulse Generator Implant Date: 20190205
MDC IDC SESS DTM: 20190326154500

## 2017-11-03 ENCOUNTER — Ambulatory Visit (INDEPENDENT_AMBULATORY_CARE_PROVIDER_SITE_OTHER): Payer: Medicare PPO | Admitting: *Deleted

## 2017-11-03 DIAGNOSIS — I459 Conduction disorder, unspecified: Secondary | ICD-10-CM

## 2017-11-04 NOTE — Progress Notes (Signed)
[facility]link Summary Report / Loop Recorder 

## 2017-11-13 LAB — CUP PACEART REMOTE DEVICE CHECK
Date Time Interrogation Session: 20190306193906
Implantable Pulse Generator Implant Date: 20190205

## 2017-12-05 LAB — CUP PACEART REMOTE DEVICE CHECK
Date Time Interrogation Session: 20190408194030
Implantable Pulse Generator Implant Date: 20190205

## 2017-12-08 ENCOUNTER — Ambulatory Visit (INDEPENDENT_AMBULATORY_CARE_PROVIDER_SITE_OTHER): Payer: Medicare PPO | Admitting: *Deleted

## 2017-12-08 DIAGNOSIS — I459 Conduction disorder, unspecified: Secondary | ICD-10-CM | POA: Diagnosis not present

## 2017-12-08 NOTE — Progress Notes (Signed)
[facility]link Summary Report / Loop Recorder 

## 2017-12-31 LAB — CUP PACEART REMOTE DEVICE CHECK
Date Time Interrogation Session: 20190511200801
MDC IDC PG IMPLANT DT: 20190205

## 2018-01-08 ENCOUNTER — Ambulatory Visit (INDEPENDENT_AMBULATORY_CARE_PROVIDER_SITE_OTHER): Payer: Medicare PPO | Admitting: *Deleted

## 2018-01-08 DIAGNOSIS — I639 Cerebral infarction, unspecified: Secondary | ICD-10-CM | POA: Diagnosis not present

## 2018-01-09 NOTE — Progress Notes (Signed)
[facility]link Summary Report / Loop Recorder 

## 2018-02-06 ENCOUNTER — Ambulatory Visit (INDEPENDENT_AMBULATORY_CARE_PROVIDER_SITE_OTHER): Payer: Medicare PPO | Admitting: *Deleted

## 2018-02-06 DIAGNOSIS — I639 Cerebral infarction, unspecified: Secondary | ICD-10-CM

## 2018-02-11 NOTE — Progress Notes (Signed)
[facility]link Summary Report / Loop Recorder 

## 2018-02-13 LAB — CUP PACEART REMOTE DEVICE CHECK
Date Time Interrogation Session: 20190613203644
MDC IDC PG IMPLANT DT: 20190205

## 2018-03-16 ENCOUNTER — Ambulatory Visit (INDEPENDENT_AMBULATORY_CARE_PROVIDER_SITE_OTHER): Payer: Medicare PPO | Admitting: *Deleted

## 2018-03-16 DIAGNOSIS — I639 Cerebral infarction, unspecified: Secondary | ICD-10-CM

## 2018-03-17 NOTE — Progress Notes (Signed)
[facility]link Summary Report / Loop Recorder 

## 2018-03-19 LAB — CUP PACEART REMOTE DEVICE CHECK
Date Time Interrogation Session: 20190716203603
MDC IDC PG IMPLANT DT: 20190205

## 2018-04-17 ENCOUNTER — Ambulatory Visit (INDEPENDENT_AMBULATORY_CARE_PROVIDER_SITE_OTHER): Payer: Medicare PPO | Admitting: *Deleted

## 2018-04-17 DIAGNOSIS — I639 Cerebral infarction, unspecified: Secondary | ICD-10-CM

## 2018-04-18 NOTE — Progress Notes (Signed)
[facility]link Summary Report / Loop Recorder 

## 2018-04-19 LAB — CUP PACEART REMOTE DEVICE CHECK
Date Time Interrogation Session: 20190920214150
MDC IDC PG IMPLANT DT: 20190205

## 2018-04-20 LAB — CUP PACEART REMOTE DEVICE CHECK
Implantable Pulse Generator Implant Date: 20190205
MDC IDC SESS DTM: 20190818213903

## 2018-04-29 ENCOUNTER — Encounter: Payer: Medicare PPO | Admitting: Internal Medicine

## 2018-05-20 ENCOUNTER — Ambulatory Visit (INDEPENDENT_AMBULATORY_CARE_PROVIDER_SITE_OTHER): Payer: Medicare PPO | Admitting: *Deleted

## 2018-05-20 DIAGNOSIS — I639 Cerebral infarction, unspecified: Secondary | ICD-10-CM

## 2018-05-21 NOTE — Progress Notes (Signed)
[facility]link Summary Report / Loop Recorder 

## 2018-06-11 LAB — CUP PACEART REMOTE DEVICE CHECK
Date Time Interrogation Session: 20191023213619
Implantable Pulse Generator Implant Date: 20190205

## 2018-06-22 ENCOUNTER — Ambulatory Visit (INDEPENDENT_AMBULATORY_CARE_PROVIDER_SITE_OTHER): Payer: Medicare PPO

## 2018-06-22 DIAGNOSIS — I639 Cerebral infarction, unspecified: Secondary | ICD-10-CM

## 2018-06-23 NOTE — Progress Notes (Signed)
[facility]link Summary Report / Loop Recorder 

## 2018-06-30 DIAGNOSIS — E1129 Type 2 diabetes mellitus with other diabetic kidney complication: Secondary | ICD-10-CM | POA: Diagnosis not present

## 2018-06-30 DIAGNOSIS — M069 Rheumatoid arthritis, unspecified: Secondary | ICD-10-CM | POA: Diagnosis not present

## 2018-06-30 DIAGNOSIS — E875 Hyperkalemia: Secondary | ICD-10-CM | POA: Diagnosis not present

## 2018-06-30 DIAGNOSIS — I10 Essential (primary) hypertension: Secondary | ICD-10-CM | POA: Diagnosis not present

## 2018-06-30 DIAGNOSIS — Z79899 Other long term (current) drug therapy: Secondary | ICD-10-CM | POA: Diagnosis not present

## 2018-07-02 DIAGNOSIS — S2341XA Sprain of ribs, initial encounter: Secondary | ICD-10-CM | POA: Diagnosis not present

## 2018-07-03 DIAGNOSIS — M5136 Other intervertebral disc degeneration, lumbar region: Secondary | ICD-10-CM | POA: Diagnosis not present

## 2018-07-03 DIAGNOSIS — E1129 Type 2 diabetes mellitus with other diabetic kidney complication: Secondary | ICD-10-CM | POA: Diagnosis not present

## 2018-07-03 DIAGNOSIS — M48061 Spinal stenosis, lumbar region without neurogenic claudication: Secondary | ICD-10-CM | POA: Diagnosis not present

## 2018-07-03 DIAGNOSIS — M0600 Rheumatoid arthritis without rheumatoid factor, unspecified site: Secondary | ICD-10-CM | POA: Diagnosis not present

## 2018-07-27 ENCOUNTER — Ambulatory Visit (INDEPENDENT_AMBULATORY_CARE_PROVIDER_SITE_OTHER): Payer: Medicare Other

## 2018-07-27 DIAGNOSIS — I639 Cerebral infarction, unspecified: Secondary | ICD-10-CM

## 2018-07-27 NOTE — Progress Notes (Signed)
[facility]link Summary Report / Loop Recorder 

## 2018-07-28 LAB — CUP PACEART REMOTE DEVICE CHECK
Implantable Pulse Generator Implant Date: 20190205
MDC IDC SESS DTM: 20191228231120

## 2018-07-29 DIAGNOSIS — I428 Other cardiomyopathies: Secondary | ICD-10-CM

## 2018-07-29 DIAGNOSIS — I350 Nonrheumatic aortic (valve) stenosis: Secondary | ICD-10-CM

## 2018-07-29 DIAGNOSIS — I2510 Atherosclerotic heart disease of native coronary artery without angina pectoris: Secondary | ICD-10-CM

## 2018-07-29 HISTORY — DX: Atherosclerotic heart disease of native coronary artery without angina pectoris: I25.10

## 2018-07-29 HISTORY — DX: Other cardiomyopathies: I42.8

## 2018-07-29 HISTORY — DX: Nonrheumatic aortic (valve) stenosis: I35.0

## 2018-08-07 DIAGNOSIS — M545 Low back pain: Secondary | ICD-10-CM | POA: Diagnosis not present

## 2018-08-09 LAB — CUP PACEART REMOTE DEVICE CHECK
Date Time Interrogation Session: 20191125224103
MDC IDC PG IMPLANT DT: 20190205

## 2018-08-10 ENCOUNTER — Encounter (HOSPITAL_COMMUNITY): Payer: Self-pay | Admitting: Internal Medicine

## 2018-08-10 ENCOUNTER — Inpatient Hospital Stay (HOSPITAL_COMMUNITY)
Admission: EM | Admit: 2018-08-10 | Discharge: 2018-08-18 | DRG: 287 | Disposition: A | Discharge status: Home / Self Care | Payer: Medicare Other | Attending: Internal Medicine | Admitting: Internal Medicine

## 2018-08-10 ENCOUNTER — Emergency Department (HOSPITAL_COMMUNITY): Payer: Medicare Other

## 2018-08-10 DIAGNOSIS — Z833 Family history of diabetes mellitus: Secondary | ICD-10-CM

## 2018-08-10 DIAGNOSIS — F329 Major depressive disorder, single episode, unspecified: Secondary | ICD-10-CM | POA: Diagnosis present

## 2018-08-10 DIAGNOSIS — Z888 Allergy status to other drugs, medicaments and biological substances status: Secondary | ICD-10-CM | POA: Diagnosis not present

## 2018-08-10 DIAGNOSIS — E785 Hyperlipidemia, unspecified: Secondary | ICD-10-CM | POA: Diagnosis present

## 2018-08-10 DIAGNOSIS — I350 Nonrheumatic aortic (valve) stenosis: Secondary | ICD-10-CM

## 2018-08-10 DIAGNOSIS — Z881 Allergy status to other antibiotic agents status: Secondary | ICD-10-CM

## 2018-08-10 DIAGNOSIS — F17210 Nicotine dependence, cigarettes, uncomplicated: Secondary | ICD-10-CM | POA: Diagnosis present

## 2018-08-10 DIAGNOSIS — M549 Dorsalgia, unspecified: Secondary | ICD-10-CM

## 2018-08-10 DIAGNOSIS — E11649 Type 2 diabetes mellitus with hypoglycemia without coma: Secondary | ICD-10-CM | POA: Diagnosis not present

## 2018-08-10 DIAGNOSIS — Z794 Long term (current) use of insulin: Secondary | ICD-10-CM

## 2018-08-10 DIAGNOSIS — I5033 Acute on chronic diastolic (congestive) heart failure: Secondary | ICD-10-CM

## 2018-08-10 DIAGNOSIS — Z8249 Family history of ischemic heart disease and other diseases of the circulatory system: Secondary | ICD-10-CM | POA: Diagnosis not present

## 2018-08-10 DIAGNOSIS — E7800 Pure hypercholesterolemia, unspecified: Secondary | ICD-10-CM | POA: Diagnosis present

## 2018-08-10 DIAGNOSIS — I110 Hypertensive heart disease with heart failure: Principal | ICD-10-CM | POA: Diagnosis present

## 2018-08-10 DIAGNOSIS — M7989 Other specified soft tissue disorders: Secondary | ICD-10-CM | POA: Diagnosis not present

## 2018-08-10 DIAGNOSIS — I4510 Unspecified right bundle-branch block: Secondary | ICD-10-CM | POA: Diagnosis present

## 2018-08-10 DIAGNOSIS — I509 Heart failure, unspecified: Secondary | ICD-10-CM

## 2018-08-10 DIAGNOSIS — I5041 Acute combined systolic (congestive) and diastolic (congestive) heart failure: Secondary | ICD-10-CM

## 2018-08-10 DIAGNOSIS — M069 Rheumatoid arthritis, unspecified: Secondary | ICD-10-CM | POA: Diagnosis present

## 2018-08-10 DIAGNOSIS — E119 Type 2 diabetes mellitus without complications: Secondary | ICD-10-CM

## 2018-08-10 DIAGNOSIS — E1142 Type 2 diabetes mellitus with diabetic polyneuropathy: Secondary | ICD-10-CM | POA: Diagnosis present

## 2018-08-10 DIAGNOSIS — G8929 Other chronic pain: Secondary | ICD-10-CM | POA: Diagnosis present

## 2018-08-10 DIAGNOSIS — Z6841 Body Mass Index (BMI) 40.0 and over, adult: Secondary | ICD-10-CM | POA: Diagnosis not present

## 2018-08-10 DIAGNOSIS — G4733 Obstructive sleep apnea (adult) (pediatric): Secondary | ICD-10-CM | POA: Diagnosis present

## 2018-08-10 DIAGNOSIS — Z79899 Other long term (current) drug therapy: Secondary | ICD-10-CM

## 2018-08-10 DIAGNOSIS — I351 Nonrheumatic aortic (valve) insufficiency: Secondary | ICD-10-CM | POA: Diagnosis not present

## 2018-08-10 DIAGNOSIS — I2510 Atherosclerotic heart disease of native coronary artery without angina pectoris: Secondary | ICD-10-CM | POA: Diagnosis present

## 2018-08-10 DIAGNOSIS — I5043 Acute on chronic combined systolic (congestive) and diastolic (congestive) heart failure: Secondary | ICD-10-CM | POA: Diagnosis present

## 2018-08-10 DIAGNOSIS — R0602 Shortness of breath: Secondary | ICD-10-CM | POA: Diagnosis present

## 2018-08-10 DIAGNOSIS — K219 Gastro-esophageal reflux disease without esophagitis: Secondary | ICD-10-CM | POA: Diagnosis present

## 2018-08-10 DIAGNOSIS — Z886 Allergy status to analgesic agent status: Secondary | ICD-10-CM | POA: Diagnosis not present

## 2018-08-10 DIAGNOSIS — E118 Type 2 diabetes mellitus with unspecified complications: Secondary | ICD-10-CM

## 2018-08-10 DIAGNOSIS — E1149 Type 2 diabetes mellitus with other diabetic neurological complication: Secondary | ICD-10-CM | POA: Diagnosis not present

## 2018-08-10 DIAGNOSIS — Z9111 Patient's noncompliance with dietary regimen: Secondary | ICD-10-CM

## 2018-08-10 DIAGNOSIS — G4730 Sleep apnea, unspecified: Secondary | ICD-10-CM

## 2018-08-10 DIAGNOSIS — E1140 Type 2 diabetes mellitus with diabetic neuropathy, unspecified: Secondary | ICD-10-CM | POA: Diagnosis present

## 2018-08-10 DIAGNOSIS — E669 Obesity, unspecified: Secondary | ICD-10-CM | POA: Diagnosis present

## 2018-08-10 HISTORY — DX: Type 2 diabetes mellitus without complications: E11.9

## 2018-08-10 HISTORY — DX: Spinal stenosis, lumbar region without neurogenic claudication: M48.061

## 2018-08-10 HISTORY — DX: Cardiac murmur, unspecified: R01.1

## 2018-08-10 LAB — COMPREHENSIVE METABOLIC PANEL
ALBUMIN: 3 g/dL — AB (ref 3.5–5.0)
ALK PHOS: 87 U/L (ref 38–126)
ALT: 19 U/L (ref 0–44)
AST: 24 U/L (ref 15–41)
Anion gap: 10 (ref 5–15)
BUN: 10 mg/dL (ref 8–23)
CO2: 30 mmol/L (ref 22–32)
Calcium: 8.5 mg/dL — ABNORMAL LOW (ref 8.9–10.3)
Chloride: 100 mmol/L (ref 98–111)
Creatinine, Ser: 0.78 mg/dL (ref 0.61–1.24)
GFR calc non Af Amer: >60 mL/min (ref >60)
GLUCOSE: 97 mg/dL (ref 70–99)
Potassium: 3.3 mmol/L — ABNORMAL LOW (ref 3.5–5.1)
SODIUM: 140 mmol/L (ref 135–145)
Total Bilirubin: 1 mg/dL (ref 0.3–1.2)
Total Protein: 6.3 g/dL — ABNORMAL LOW (ref 6.5–8.1)

## 2018-08-10 LAB — CBC WITH DIFFERENTIAL/PLATELET
ABS IMMATURE GRANULOCYTES: 0.01 10*3/uL (ref 0.00–0.07)
Basophils Absolute: 0 10*3/uL (ref 0.0–0.1)
Basophils Relative: 0 %
Eosinophils Absolute: 0.1 10*3/uL (ref 0.0–0.5)
Eosinophils Relative: 1 %
HCT: 40.5 % (ref 39.0–52.0)
HEMOGLOBIN: 12.5 g/dL — AB (ref 13.0–17.0)
Immature Granulocytes: 0 %
LYMPHS PCT: 22 %
Lymphs Abs: 1.6 10*3/uL (ref 0.7–4.0)
MCH: 27.1 pg (ref 26.0–34.0)
MCHC: 30.9 g/dL (ref 30.0–36.0)
MCV: 87.7 fL (ref 80.0–100.0)
MONO ABS: 0.7 10*3/uL (ref 0.1–1.0)
Monocytes Relative: 10 %
NEUTROS ABS: 4.9 10*3/uL (ref 1.7–7.7)
Neutrophils Relative %: 67 %
Platelets: 246 10*3/uL (ref 150–400)
RBC: 4.62 MIL/uL (ref 4.22–5.81)
RDW: 15.5 % (ref 11.5–15.5)
WBC: 7.3 10*3/uL (ref 4.0–10.5)
nRBC: 0 % (ref 0.0–0.2)

## 2018-08-10 LAB — GLUCOSE, CAPILLARY: GLUCOSE-CAPILLARY: 173 mg/dL — AB (ref 70–99)

## 2018-08-10 LAB — I-STAT TROPONIN, ED: Troponin i, poc: 0.01 ng/mL (ref 0.00–0.08)

## 2018-08-10 LAB — CBG MONITORING, ED: Glucose-Capillary: 139 mg/dL — ABNORMAL HIGH (ref 70–99)

## 2018-08-10 LAB — BRAIN NATRIURETIC PEPTIDE: B NATRIURETIC PEPTIDE 5: 290.8 pg/mL — AB (ref 0.0–100.0)

## 2018-08-10 MED ORDER — ADULT MULTIVITAMIN W/MINERALS CH
1.0000 | ORAL_TABLET | Freq: Every morning | ORAL | Status: DC
  Administered 2018-08-11 – 2018-08-18 (×8): 1 via ORAL
  Filled 2018-08-10 (×8): qty 1

## 2018-08-10 MED ORDER — ESCITALOPRAM OXALATE 10 MG PO TABS
10.0000 mg | ORAL_TABLET | Freq: Every evening | ORAL | Status: DC
  Administered 2018-08-10 – 2018-08-17 (×8): 10 mg via ORAL
  Filled 2018-08-10 (×8): qty 1

## 2018-08-10 MED ORDER — GABAPENTIN 600 MG PO TABS
1200.0000 mg | ORAL_TABLET | Freq: Every day | ORAL | Status: DC
  Administered 2018-08-10 – 2018-08-17 (×8): 1200 mg via ORAL
  Filled 2018-08-10 (×8): qty 2

## 2018-08-10 MED ORDER — ENOXAPARIN SODIUM 40 MG/0.4ML ~~LOC~~ SOLN
40.0000 mg | SUBCUTANEOUS | Status: DC
  Administered 2018-08-10: 40 mg via SUBCUTANEOUS
  Filled 2018-08-10: qty 0.4

## 2018-08-10 MED ORDER — INSULIN ASPART 100 UNIT/ML ~~LOC~~ SOLN
10.0000 [IU] | Freq: Three times a day (TID) | SUBCUTANEOUS | Status: DC
  Administered 2018-08-11: 7 [IU] via SUBCUTANEOUS
  Administered 2018-08-11 – 2018-08-16 (×11): 10 [IU] via SUBCUTANEOUS

## 2018-08-10 MED ORDER — FUROSEMIDE 10 MG/ML IJ SOLN
40.0000 mg | Freq: Once | INTRAMUSCULAR | Status: AC
  Administered 2018-08-10: 40 mg via INTRAVENOUS
  Filled 2018-08-10: qty 4

## 2018-08-10 MED ORDER — INSULIN NPH (HUMAN) (ISOPHANE) 100 UNIT/ML ~~LOC~~ SUSP
40.0000 [IU] | Freq: Two times a day (BID) | SUBCUTANEOUS | Status: DC
  Administered 2018-08-10 – 2018-08-15 (×6): 40 [IU] via SUBCUTANEOUS
  Filled 2018-08-10 (×2): qty 10

## 2018-08-10 MED ORDER — GABAPENTIN 600 MG PO TABS: 600.0000 mg | ORAL_TABLET | Freq: Two times a day (BID) | ORAL | Status: DC

## 2018-08-10 MED ORDER — LOSARTAN POTASSIUM 50 MG PO TABS
50.0000 mg | ORAL_TABLET | Freq: Every morning | ORAL | Status: DC
  Administered 2018-08-11: 50 mg via ORAL
  Filled 2018-08-10: qty 1

## 2018-08-10 MED ORDER — GABAPENTIN 600 MG PO TABS
600.0000 mg | ORAL_TABLET | Freq: Every morning | ORAL | Status: DC
  Administered 2018-08-11 – 2018-08-18 (×8): 600 mg via ORAL
  Filled 2018-08-10 (×8): qty 1

## 2018-08-10 MED ORDER — AMLODIPINE BESYLATE 2.5 MG PO TABS
5.0000 mg | ORAL_TABLET | Freq: Every day | ORAL | Status: DC
  Administered 2018-08-11: 5 mg via ORAL
  Filled 2018-08-10: qty 2

## 2018-08-10 MED ORDER — INSULIN ASPART 100 UNIT/ML ~~LOC~~ SOLN
0.0000 [IU] | Freq: Three times a day (TID) | SUBCUTANEOUS | Status: DC
  Administered 2018-08-11 (×2): 2 [IU] via SUBCUTANEOUS
  Administered 2018-08-12: 3 [IU] via SUBCUTANEOUS
  Administered 2018-08-12: 1 [IU] via SUBCUTANEOUS
  Administered 2018-08-13 – 2018-08-14 (×3): 2 [IU] via SUBCUTANEOUS
  Administered 2018-08-15: 1 [IU] via SUBCUTANEOUS
  Administered 2018-08-16 – 2018-08-17 (×4): 2 [IU] via SUBCUTANEOUS
  Administered 2018-08-18: 5 [IU] via SUBCUTANEOUS
  Administered 2018-08-18: 2 [IU] via SUBCUTANEOUS

## 2018-08-10 MED ORDER — POTASSIUM CHLORIDE CRYS ER 20 MEQ PO TBCR
40.0000 meq | EXTENDED_RELEASE_TABLET | Freq: Two times a day (BID) | ORAL | Status: DC
  Administered 2018-08-10 – 2018-08-18 (×16): 40 meq via ORAL
  Filled 2018-08-10 (×16): qty 2

## 2018-08-10 MED ORDER — ACETAMINOPHEN 650 MG RE SUPP: 650.0000 mg | Freq: Four times a day (QID) | RECTAL | Status: DC | PRN

## 2018-08-10 MED ORDER — FUROSEMIDE 10 MG/ML IJ SOLN
40.0000 mg | Freq: Two times a day (BID) | INTRAMUSCULAR | Status: DC
  Administered 2018-08-10 – 2018-08-11 (×2): 40 mg via INTRAVENOUS
  Filled 2018-08-10 (×3): qty 4

## 2018-08-10 MED ORDER — PANTOPRAZOLE SODIUM 40 MG PO TBEC
40.0000 mg | DELAYED_RELEASE_TABLET | Freq: Every morning | ORAL | Status: DC
  Administered 2018-08-11 – 2018-08-18 (×8): 40 mg via ORAL
  Filled 2018-08-10 (×2): qty 1
  Filled 2018-08-10: qty 2
  Filled 2018-08-10 (×8): qty 1

## 2018-08-10 MED ORDER — HYDROCODONE-ACETAMINOPHEN 5-325 MG PO TABS
1.0000 | ORAL_TABLET | ORAL | Status: DC | PRN
  Administered 2018-08-10 – 2018-08-12 (×4): 1 via ORAL
  Filled 2018-08-10 (×5): qty 1

## 2018-08-10 MED ORDER — ATORVASTATIN CALCIUM 40 MG PO TABS
40.0000 mg | ORAL_TABLET | Freq: Every evening | ORAL | Status: DC
  Administered 2018-08-11 – 2018-08-17 (×7): 40 mg via ORAL
  Filled 2018-08-10 (×7): qty 1

## 2018-08-10 MED ORDER — ACETAMINOPHEN 325 MG PO TABS: 650.0000 mg | ORAL_TABLET | Freq: Four times a day (QID) | ORAL | Status: DC | PRN

## 2018-08-10 MED ORDER — FAMOTIDINE 20 MG PO TABS
40.0000 mg | ORAL_TABLET | Freq: Every day | ORAL | Status: DC
  Administered 2018-08-10 – 2018-08-17 (×8): 40 mg via ORAL
  Filled 2018-08-10 (×8): qty 2

## 2018-08-10 MED ORDER — POTASSIUM CHLORIDE CRYS ER 20 MEQ PO TBCR
40.0000 meq | EXTENDED_RELEASE_TABLET | Freq: Once | ORAL | Status: AC
  Administered 2018-08-10: 40 meq via ORAL
  Filled 2018-08-10: qty 2

## 2018-08-10 NOTE — ED Provider Notes (Signed)
[facility] EMERGENCY DEPARTMENT Provider Note   CSN: [id number] Arrival date & time: 08/10/18  1119     History   Chief Complaint Chief Complaint  Patient presents with  . Shortness of Breath    HPI Daniel Valenzuela is a 68 y.o. male.  HPI   Pt is a 68 yo male with a h/o chronic back pain, depression, diabetes, GERD, hyperlipidemia, hypertension, rheumatoid arthritis, OSA, who presents the emergency department today for evaluation of SOB over the last month. Pt states sxs have been constant and worsening since onset. States he has had BLE edema and swelling to his abdomen.  He reports a cough that is productive.  He denies chest pain. Denies fevers, NVD, abd pain.   He increased his lasix on 12/6 from 40mg  to 80mg  daily. States that last week he increased lasix again to 80mg  in AM and 40mg  at night.  He saw his pcp today in clinic for his sxs and was advised to come to the ED.   Pt has gained 33 pounds since Dec 3.   Past Medical History:  Diagnosis Date  . Chronic back pain   . Depression   . Diabetes mellitus   . Diabetic neuropathy (HCC)   . GERD (gastroesophageal reflux disease)   . High cholesterol   . Hypertension   . Obesity   . Rheumatoid arteritis   . Sleep apnea   . Spinal stenosis     Patient Active Problem List   Diagnosis Date Noted  . Syncope 08/30/2017  . RBBB (right bundle branch block with left anterior fascicular block) 08/30/2017  . Diabetic neuropathy (HCC)   . Obesity   . Chronic back pain   . Spinal stenosis   . High cholesterol     Past Surgical History:  Procedure Laterality Date  . HERNIA REPAIR    . LAPAROSCOPIC GASTRIC BANDING    . LOOP RECORDER INSERTION N/A 09/01/2017   Procedure: LOOP RECORDER INSERTION;  Surgeon: Duke Salvia, MD;  Location: [facility] INVASIVE CV LAB;  Service: Cardiovascular;  Laterality: N/A;  . SHOULDER ARTHROSCOPY    . TONSILLECTOMY          Home Medications    Prior to Admission  medications   Medication Sig Start Date End Date Taking? Authorizing Provider  amLODipine (NORVASC) 5 MG tablet Take 1 tablet (5 mg total) by mouth daily. 09/01/17 09/01/18  Sheilah Pigeon, PA-C  atorvastatin (LIPITOR) 40 MG tablet Take 40 mg by mouth every evening.    [provider]  Coenzyme Q10 (CO Q 10) 100 MG CAPS Take 1 capsule by mouth at bedtime.    [provider]  Cyanocobalamin (B-12) 5000 MCG CAPS Take 1 capsule by mouth every morning.    [provider]  escitalopram (LEXAPRO) 10 MG tablet Take 10 mg by mouth every evening.    [provider]  famotidine (PEPCID) 20 MG tablet Take 20 mg by mouth at bedtime.    [provider]  furosemide (LASIX) 40 MG tablet Take 40 mg by mouth every morning.    [provider]  gabapentin (NEURONTIN) 600 MG tablet Take 600-1,200 mg by mouth 2 (two) times daily. 600mg  in the morning and 1200mg  at bedtime    [provider]  HYDROcodone-acetaminophen (NORCO/VICODIN) 5-325 MG tablet Take 1 tablet by mouth every 4 (four) hours as needed for moderate pain.    [provider]  Insulin Lispro Prot & Lispro (HUMALOG MIX  75/25 KWIKPEN) (75-25) 100 UNIT/ML Kwikpen Inject 40 Units into the skin 2 (two) times daily.     [provider]  losartan (COZAAR) 100 MG tablet Take 50 mg by mouth every morning.    [provider]  Magnesium 500 MG CAPS Take 1 capsule by mouth at bedtime.    [provider]  Multiple Minerals-Vitamins (CAL-MAG-ZINC-D PO) Take 1 tablet by mouth daily.    [provider]  Multiple Vitamin (MULTIVITAMIN WITH MINERALS) TABS tablet Take 1 tablet by mouth every morning.    [provider]  pantoprazole (PROTONIX) 40 MG tablet Take 40 mg by mouth every morning.    [provider]    Family History Family History  Problem Relation Age of Onset  . Cancer Mother   . Diabetes Mother   . Parkinson's disease Father   .  Heart failure Father     Social History Social History   Tobacco Use  . Smoking status: Current Some Day Smoker    Types: Cigars  . Smokeless tobacco: Never Used  Substance Use Topics  . Alcohol use: No    Frequency: Never  . Drug use: No     Allergies   Methyldopa-hydrochlorothiazide; Amoxicillin; and Motrin [ibuprofen]   Review of Systems Review of Systems  Constitutional: Negative for fever.  HENT: Negative for ear pain and sore throat.   Eyes: Negative for pain and visual disturbance.  Respiratory: Positive for cough and shortness of breath.   Cardiovascular: Positive for leg swelling. Negative for chest pain.  Gastrointestinal: Negative for abdominal pain, diarrhea, nausea and vomiting.  Genitourinary: Negative for hematuria.  Musculoskeletal: Negative for back pain.  Skin: Negative for rash.  Neurological: Negative for headaches.  All other systems reviewed and are negative.    Physical Exam Updated Vital Signs BP 121/90   Pulse 99   Temp 98.6 F (37 C)   Resp 18   SpO2 95%   Physical Exam Vitals signs and nursing note reviewed.  Constitutional:      Appearance: He is well-developed.  HENT:     Head: Normocephalic and atraumatic.  Eyes:     Conjunctiva/sclera: Conjunctivae normal.  Neck:     Musculoskeletal: Neck supple.  Cardiovascular:     Rate and Rhythm: Normal rate and regular rhythm.     Heart sounds: Murmur present.  Pulmonary:     Effort: Pulmonary effort is normal. No respiratory distress.     Breath sounds: Decreased breath sounds and rales present.  Abdominal:     General: Bowel sounds are normal.     Comments: Lower abd is firm and there is pitting edema present. BS present. Nontender.  Musculoskeletal:     Right lower leg: Edema (3+) present.     Left lower leg: Edema (3+) present.  Skin:    General: Skin is warm and dry.  Neurological:     Mental Status: He is alert.      ED Treatments / Results  Labs (all labs ordered  are listed, but only abnormal results are displayed) Labs Reviewed  CBC WITH DIFFERENTIAL/PLATELET - Abnormal; Notable for the following components:      Result Value   Hemoglobin 12.5 (*)    All other components within normal limits  COMPREHENSIVE METABOLIC PANEL - Abnormal; Notable for the following components:   Potassium 3.3 (*)    Calcium 8.5 (*)    Total Protein 6.3 (*)    Albumin 3.0 (*)    All other  components within normal limits  BRAIN NATRIURETIC PEPTIDE - Abnormal; Notable for the following components:   B Natriuretic Peptide 290.8 (*)    All other components within normal limits  I-STAT TROPONIN, ED    EKG EKG Interpretation  Date/Time:  Monday August 10 2018 11:30:06 EST Ventricular Rate:  98 PR Interval:    QRS Duration: 138 QT Interval:  384 QTC Calculation: 490 R Axis:   71 Text Interpretation:  Wide QRS rhythm with occasional Premature ventricular complexes Non-specific intra-ventricular conduction block Cannot rule out Septal infarct , age undetermined Abnormal ECG Confirmed by Kristine Royal [id number]) on 08/10/2018 1:38:41 PM   Radiology Dg Chest 2 View  Result Date: 08/10/2018 CLINICAL DATA:  Shortness of breath, weight gain. EXAM: CHEST - 2 VIEW COMPARISON:  Radiographs of August 30, 2017. FINDINGS: Stable cardiomegaly. Increased bilateral pulmonary edema is noted. Mild bibasilar subsegmental atelectasis is now noted with small pleural effusions. No pneumothorax is noted. Bony thorax is unremarkable. IMPRESSION: Bilateral pulmonary edema is noted with probable bibasilar subsegmental atelectasis and small pleural effusions. Electronically Signed   By: Lupita Raider, M.D.   On: 08/10/2018 12:47    Procedures Procedures (including critical care time)  Medications Ordered in ED Medications  furosemide (LASIX) injection 40 mg (40 mg Intravenous Given 08/10/18 1407)  potassium chloride SA (K-DUR,KLOR-CON) CR tablet 40 mEq (40 mEq Oral Given 08/10/18 1459)      Initial Impression / Assessment and Plan / ED Course  I have reviewed the triage vital signs and the nursing notes.  Pertinent labs & imaging results that were available during my care of the patient were reviewed by me and considered in my medical decision making (see chart for details).    Final Clinical Impressions(s) / ED Diagnoses   Final diagnoses:  Acute on chronic diastolic CHF (congestive heart failure) (HCC)   Patient presenting with increasing shortness of breath.  Has recently increase his leg 6 but continues to gain weight and have increasing peripheral edema up to the abdomen.  Satting at 95% on room air.  No fevers.  Otherwise vital signs are normal.  He does have crackles bilaterally on his lung exam.  CBC shows no leukocytosis however does show mild anemia.  This appears to be chronic for him.  CMP with slightly low potassium at 3.3, supplemented in the ED. kidney and liver function are normal.  Troponin is negative.  BNP elevated at 290.  CXR with bilateral pulmonary edema with probable bibasilar subsegmental atelectasis and small bilateral pleural effusions.  EKG with wide QRS rhythm with occasional Premature ventricular complexes Non-specific intra-ventricular conduction block Cannot rule out Septal infarct, age undetermined Abnormal ECG.  Given pts elevated BNP, worsening LE swelling, SOB, and evidence of pulmonary edema will plan for admission for further tx since he has failed outpatient therapy.  CONSULT with Dr. Jerral Ralph who accepts pt for admission  ED Discharge Orders    None       Rayne Du 08/10/18 1554    Wynetta Fines, MD 08/11/18 1109

## 2018-08-10 NOTE — Progress Notes (Signed)
Teaching was done with patient and his wife about diet. Patient's wife got him some diet mountain dew, I advised him not to drink  Beverages that contain caffeine.

## 2018-08-10 NOTE — H&P (Signed)
History and Physical    [name]:[id number] DOB: 1950/11/01 DOA: 08/10/2018  PCP: Carylon Perches, MD  Patient coming from: home   I have personally briefly reviewed patient's old medical records in Epic and Care everywhere.   Chief Complaint: Shortness of breath and weight gain.  HPI: Daniel Valenzuela is a 68 y.o. male with medical history significant of type 2 diabetes, morbid obesity, sleep apnea untreated, GERD, history of diastolic dysfunction who presents to the hospital with progressive shortness of breath and weight gain of 33 pounds in 1 month.  According to the patient, he was doing well with weight loss and intermittent fasting, however since last 1 month he has been gaining weight.  Recorded 33 pounds since about 5 weeks.  He also has progressive swelling of the legs.  His abdomen is distended more than usual.  He also having exacerbating back pain because of distended abdomen.  Patient gets short of breath on walking to the bathroom about the room away.  Sleeps in a incline her, no PND.  His last echocardiogram was with normal ejection fraction.  Patient does have history of right bundle branch block, he does have a history of syncope and has implantable loop recorder.  He had various events but no arrhythmias.  Patient did have some bradycardia and his beta-blockers were discontinued.  No more syncopal episodes.  From outpatient office, they have been trying to increase his Lasix, at this point he is on 80 mg of Lasix a day with no improvement. ED Course: Patient is hemodynamically stable.  He is on room air.  He has elevated BNP.  He has 3+ edema.  Chest x-ray shows prominent vasculature.  Minimal pleural effusion.  Twelve-lead EKG is normal.  Due to significant weight gain and no response to oral diuretic therapy, requested admission to the hospital.  Review of Systems: As per HPI otherwise 10 point review of systems negative.    Past Medical History:  Diagnosis Date  . Chronic  back pain   . Depression   . Diabetes mellitus   . Diabetic neuropathy (HCC)   . GERD (gastroesophageal reflux disease)   . High cholesterol   . Hypertension   . Obesity   . Rheumatoid arteritis (HCC)   . Sleep apnea   . Spinal stenosis     Past Surgical History:  Procedure Laterality Date  . HERNIA REPAIR    . LAPAROSCOPIC GASTRIC BANDING    . LOOP RECORDER INSERTION N/A 09/01/2017   Procedure: LOOP RECORDER INSERTION;  Surgeon: Duke Salvia, MD;  Location: [facility] INVASIVE CV LAB;  Service: Cardiovascular;  Laterality: N/A;  . SHOULDER ARTHROSCOPY    . TONSILLECTOMY       reports that he has been smoking cigars. He has never used smokeless tobacco. He reports that he does not drink alcohol or use drugs.  Allergies  Allergen Reactions  . Methyldopa-Hydrochlorothiazide Other (See Comments)    GYNOCOMASTIA  . Amoxicillin     rash  . Motrin [Ibuprofen]     Chest swelling    Family History  Problem Relation Age of Onset  . Cancer Mother   . Diabetes Mother   . Parkinson's disease Father   . Heart failure Father      Prior to Admission medications   Medication Sig Start Date End Date Taking? Authorizing Provider  amLODipine (NORVASC) 5 MG tablet Take 1 tablet (5 mg total) by mouth daily. 09/01/17 09/01/18 Yes Sheilah Pigeon, PA-C  atorvastatin (LIPITOR) 40 MG tablet Take 40 mg by mouth every evening.   Yes [provider]  Cyanocobalamin (B-12) 5000 MCG CAPS Take 1 capsule by mouth every morning.   Yes [provider]  escitalopram (LEXAPRO) 10 MG tablet Take 10 mg by mouth every evening.   Yes [provider]  famotidine (PEPCID) 40 MG tablet Take 40 mg by mouth at bedtime.    Yes [provider]  furosemide (LASIX) 40 MG tablet Take 80 mg by mouth every morning.    Yes [provider]  gabapentin (NEURONTIN) 600 MG tablet Take 600-1,200 mg by mouth 2 (two) times daily. 600mg  in the morning and 1200mg  at bedtime   Yes [provider]  HYDROcodone-acetaminophen (NORCO/VICODIN) 5-325 MG tablet Take 1 tablet by mouth every 4 (four) hours as needed for moderate pain.   Yes [provider]  insulin NPH Human (HUMULIN N,NOVOLIN N) 100 UNIT/ML injection Inject 40 Units into the skin 2 (two) times daily before a meal.   Yes [provider]  insulin regular (NOVOLIN R,HUMULIN R) 100 units/mL injection Inject 15-20 Units into the skin 2 (two) times daily before a meal.   Yes [provider]  Multiple Minerals-Vitamins (CAL-MAG-ZINC-D PO) Take 1 tablet by mouth daily.   Yes [provider]  Multiple Vitamin (MULTIVITAMIN WITH MINERALS) TABS tablet Take 1 tablet by mouth every morning.   Yes [provider]  pantoprazole (PROTONIX) 40 MG tablet Take 40 mg by mouth every morning.   Yes [provider]  losartan (COZAAR) 100 MG tablet Take 50 mg by mouth every morning.    [provider]    Physical Exam: Vitals:   08/10/18 1128  BP: 121/90  Pulse: 99  Resp: 18  Temp: 98.6 F (37 C)  SpO2: 95%    Constitutional: NAD, calm, comfortable Vitals:   08/10/18 1128  BP: 121/90  Pulse: 99  Resp: 18  Temp: 98.6 F (37 C)  SpO2: 95%   Eyes: PERRL, lids and conjunctivae normal Patient is on room air.  He is sitting in stretcher comfortably. ENMT: Mucous membranes are moist. Posterior pharynx clear of any exudate or lesions.Normal dentition.  Neck: normal, supple, no masses, no thyromegaly Respiratory: clear to auscultation bilaterally, no wheezing, no crackles. Normal respiratory effort. No accessory muscle use.  Very obese , no added sounds. Cardiovascular: Regular rate and rhythm, no murmurs / rubs / gallops. 2+ pedal pulses. No carotid bruits.  3+ bilateral pedal edema. Abdomen: no tenderness, no masses palpated. No hepatosplenomegaly. Bowel sounds positive.  Obese and pendulous abdomen with subcutaneous edema. Musculoskeletal: no clubbing / cyanosis.  No joint deformity upper and lower extremities. Good ROM, no contractures. Normal muscle tone.  Skin: no rashes, lesions, ulcers. No induration Neurologic: CN 2-12 grossly intact. Sensation intact, DTR normal. Strength 5/5 in all 4.  Psychiatric: Normal judgment and insight. Alert and oriented x 3. Normal mood.     Labs on Admission: I have personally reviewed following labs and imaging studies  CBC: Recent Labs  Lab 08/10/18 1356  WBC 7.3  NEUTROABS 4.9  HGB 12.5*  HCT 40.5  MCV 87.7  PLT 246   Basic Metabolic Panel: Recent Labs  Lab 08/10/18 1356  NA 140  K 3.3*  CL 100  CO2 30  GLUCOSE 97  BUN 10  CREATININE 0.78  CALCIUM 8.5*   GFR: CrCl cannot be calculated (Unknown ideal weight.). Liver Function Tests: Recent Labs  Lab 08/10/18 1356  AST 24  ALT 19  ALKPHOS 87  BILITOT 1.0  PROT 6.3*  ALBUMIN 3.0*   No results for input(s): LIPASE, AMYLASE in the last 168 hours. No results for input(s): AMMONIA in the last 168 hours. Coagulation Profile: No results for input(s): INR, PROTIME in the last 168 hours. Cardiac Enzymes: No results for input(s): CKTOTAL, CKMB, CKMBINDEX, TROPONINI in the last 168 hours. BNP (last 3 results) No results for input(s): PROBNP in the last 8760 hours. HbA1C: No results for input(s): HGBA1C in the last 72 hours. CBG: No results for input(s): GLUCAP in the last 168 hours. Lipid Profile: No results for input(s): CHOL, HDL, LDLCALC, TRIG, CHOLHDL, LDLDIRECT in the last 72 hours. Thyroid Function Tests: No results for input(s): TSH, T4TOTAL, FREET4, T3FREE, THYROIDAB in the last 72 hours. Anemia Panel: No results for input(s): VITAMINB12, FOLATE, FERRITIN, TIBC, IRON, RETICCTPCT in the last 72 hours. Urine analysis: No results found for: COLORURINE, APPEARANCEUR, LABSPEC, PHURINE, GLUCOSEU, HGBUR, BILIRUBINUR, KETONESUR, PROTEINUR, UROBILINOGEN, NITRITE, LEUKOCYTESUR  Radiological Exams on Admission: Dg Chest 2 View  Result  Date: 08/10/2018 CLINICAL DATA:  Shortness of breath, weight gain. EXAM: CHEST - 2 VIEW COMPARISON:  Radiographs of August 30, 2017. FINDINGS: Stable cardiomegaly. Increased bilateral pulmonary edema is noted. Mild bibasilar subsegmental atelectasis is now noted with small pleural effusions. No pneumothorax is noted. Bony thorax is unremarkable. IMPRESSION: Bilateral pulmonary edema is noted with probable bibasilar subsegmental atelectasis and small pleural effusions. Electronically Signed   By: Lupita Raider, M.D.   On: 08/10/2018 12:47    EKG: Independently reviewed.  Normal sinus rhythm.  Has right bundle blanch block.  His EKG is similar to EKG from about a year ago.  Assessment/Plan Principal Problem:   Shortness of breath Active Problems:   Diabetic neuropathy (HCC)   Obesity   Chronic back pain   Sleep apnea   Type 2 diabetes mellitus without complication (HCC)   CHF (congestive heart failure) (HCC)   Shortness of breath: Multifactorial.   Suspect sleep apnea, diastolic dysfunction, weight gain.  Patient does have evidence of fluid overload.  Patient currently may dynamically stable.  He tried oral Lasix with no improvement. Agree with admission to monitored unit.  Will start patient on Lasix 40 mg IV twice a day that will be about double the dose from home.  Keep on the schedule potassium replacement.  Daily weight.  Intake and output monitoring.  Will repeat echocardiogram in the morning.  Low-salt and fluid restriction diet.  Obstructive sleep apnea with morbid obesity: Patient does not use CPAP.  He will need new sleep study.  Patient will definitely benefit with CPAP.  Discussed this with patient and wife.  Chronic back pain: Patient on chronic opiates that he can continue.  Type 2 diabetes with peripheral neuropathy: Patient on both long-acting insulin and prandial insulin that he will continue.  Closely monitor levels.  He is trying his best to modify his diet and  lifestyle.      DVT prophylaxis: Lovenox subcu. Code Status: Full code. Family Communication: Wife at the bedside. Disposition Plan: Home. Consults called: None. Admission status: Inpatient.   Dorcas Carrow MD [facility] Pager [phone]  If 7PM-7AM, please contact night-coverage [email or web address] Password [facility]  08/10/2018, 4:37 PM

## 2018-08-10 NOTE — ED Triage Notes (Signed)
Pt here from [facility] for a 30 lb weight gain over the last month and sob when walking and fine when lying down , no chest pain

## 2018-08-11 ENCOUNTER — Other Ambulatory Visit (HOSPITAL_COMMUNITY): Payer: Medicare Other

## 2018-08-11 DIAGNOSIS — I5033 Acute on chronic diastolic (congestive) heart failure: Secondary | ICD-10-CM

## 2018-08-11 DIAGNOSIS — Z794 Long term (current) use of insulin: Secondary | ICD-10-CM

## 2018-08-11 DIAGNOSIS — E1149 Type 2 diabetes mellitus with other diabetic neurological complication: Secondary | ICD-10-CM

## 2018-08-11 DIAGNOSIS — G4733 Obstructive sleep apnea (adult) (pediatric): Secondary | ICD-10-CM

## 2018-08-11 DIAGNOSIS — I5041 Acute combined systolic (congestive) and diastolic (congestive) heart failure: Secondary | ICD-10-CM

## 2018-08-11 DIAGNOSIS — E119 Type 2 diabetes mellitus without complications: Secondary | ICD-10-CM

## 2018-08-11 DIAGNOSIS — I350 Nonrheumatic aortic (valve) stenosis: Secondary | ICD-10-CM

## 2018-08-11 DIAGNOSIS — Z6841 Body Mass Index (BMI) 40.0 and over, adult: Secondary | ICD-10-CM

## 2018-08-11 LAB — CBC
HCT: 38.3 % — ABNORMAL LOW (ref 39.0–52.0)
Hemoglobin: 11.9 g/dL — ABNORMAL LOW (ref 13.0–17.0)
MCH: 26.7 pg (ref 26.0–34.0)
MCHC: 31.1 g/dL (ref 30.0–36.0)
MCV: 85.9 fL (ref 80.0–100.0)
Platelets: 234 10*3/uL (ref 150–400)
RBC: 4.46 MIL/uL (ref 4.22–5.81)
RDW: 15.4 % (ref 11.5–15.5)
WBC: 7.3 10*3/uL (ref 4.0–10.5)
nRBC: 0 % (ref 0.0–0.2)

## 2018-08-11 LAB — BASIC METABOLIC PANEL
Anion gap: 8 (ref 5–15)
BUN: 12 mg/dL (ref 8–23)
CO2: 32 mmol/L (ref 22–32)
Calcium: 8.4 mg/dL — ABNORMAL LOW (ref 8.9–10.3)
Chloride: 102 mmol/L (ref 98–111)
Creatinine, Ser: 0.95 mg/dL (ref 0.61–1.24)
GFR calc Af Amer: >60 mL/min (ref >60)
GFR calc non Af Amer: >60 mL/min (ref >60)
Glucose, Bld: 195 mg/dL — ABNORMAL HIGH (ref 70–99)
Potassium: 4 mmol/L (ref 3.5–5.1)
Sodium: 142 mmol/L (ref 135–145)

## 2018-08-11 LAB — GLUCOSE, CAPILLARY
GLUCOSE-CAPILLARY: 118 mg/dL — AB (ref 70–99)
Glucose-Capillary: 156 mg/dL — ABNORMAL HIGH (ref 70–99)
Glucose-Capillary: 195 mg/dL — ABNORMAL HIGH (ref 70–99)
Glucose-Capillary: 60 mg/dL — ABNORMAL LOW (ref 70–99)
Glucose-Capillary: 86 mg/dL (ref 70–99)

## 2018-08-11 LAB — BRAIN NATRIURETIC PEPTIDE: B NATRIURETIC PEPTIDE 5: 361.3 pg/mL — AB (ref 0.0–100.0)

## 2018-08-11 MED ORDER — FUROSEMIDE 10 MG/ML IJ SOLN
80.0000 mg | Freq: Two times a day (BID) | INTRAMUSCULAR | Status: DC
  Administered 2018-08-11 – 2018-08-18 (×14): 80 mg via INTRAVENOUS
  Filled 2018-08-11 (×14): qty 8

## 2018-08-11 MED ORDER — ENOXAPARIN SODIUM 100 MG/ML ~~LOC~~ SOLN
90.0000 mg | SUBCUTANEOUS | Status: DC
  Administered 2018-08-11 – 2018-08-13 (×3): 90 mg via SUBCUTANEOUS
  Filled 2018-08-11 (×3): qty 1

## 2018-08-11 NOTE — Consult Note (Addendum)
Cardiology Consult    Patient ID: Clare GandyDavid W Marrocco MRN: [id number], DOB/AGE: 66/02/1951   Admit date: 08/10/2018 Date of Consult: 08/11/2018  Primary Physician: Carylon PerchesFagan, Roy, MD Primary Cardiologist: No primary care provider on file.  Primary Electrophysiologist: Dr. Ladona Ridgelaylor Requesting Provider: Dr. Jerral RalphGhimire.  Patient Profile    Clare GandyDavid W Traeger is a 68 y.o. male with a history of hypertension, hyperlipidemia, diabetes mellitus, and sleep apnea, who is being seen today for the evaluation of CHF at the request of Dr. Jerral RalphGhimire.  History of Present Illness    Mr. Hilma FavorsBowyer is a 68 year old male with the above history. Patient admitted in 08/2017 for evaluation of syncopal episode. EKG at that time showed normal sinus rhythm with 1st degree AV block and RBBB. Echocardiogram showed LVEF of 60-65% with normal diastolic function parameter and no regional wall motion abnormalities. Mild aortic stenosis was also noted with mean gradient of 15 mmHg. Patient's Verapamil was discontinued and loop recorder was placed. Patient saw Dr. Ladona Ridgelaylor for follow-up in 09/2017 at which point he reported another syncopal episode since discharge. Patient went to the ED and his device was interrogated but did not show any brady and tachy arrhythmias. Syncope at that time felt to be due to autonomic dysfunction with vasodepression.   [facility] for evaluation of worsening shortness of breath and swelling. Patient reports worsening shortness of breath with minimal activity to the point where he has to stop and catch his breath after walking a short distance in his house. Patient also states he sleeps on an incline and often wakes up with coughing episodes. Patient has noticed significant lower extremity edema with blistering for about the past year but has noticed abdominal swelling/tightness over the last month. He reports a 30lb weight in the past month as well. He denies any chest pain,  palpitations, or recent pre-syncope, syncope. No recent fevers or illnesses.   Upon arrival to the ED, vitals stable. EKG showed sinus rhythm, rate 98 bpm, with known right bundle branch block. Troponin negative. Chest x-ray showed bilateral pulmonary edema with probable bibasilar subsegmental atelectasis and small pleural effusions. BNP elevated at 290.8. WBC 7.3, Hgb 12.5, Plts 246. Na 140, K 3.3, Glucose 97, SCr 0.78. Patient was started on IV Lasix and admitted for further evaluation and management of CHF.  Patient states he feel a little better since receiving the Lasix.   Patient reports intermittent tobacco use over the years but denies any current tobacco use. Patient states his father had a history of congestive heart failure but denies any other known family history of heart disease.   Past Medical History   Past Medical History:  Diagnosis Date  . Chronic back pain   . Depression   . Diabetes mellitus   . Diabetic neuropathy (HCC)   . GERD (gastroesophageal reflux disease)   . High cholesterol   . Hypertension   . Obesity   . Rheumatoid arteritis (HCC)   . Sleep apnea   . Spinal stenosis     Past Surgical History:  Procedure Laterality Date  . HERNIA REPAIR    . LAPAROSCOPIC GASTRIC BANDING    . LOOP RECORDER INSERTION N/A 09/01/2017   Procedure: LOOP RECORDER INSERTION;  Surgeon: Duke SalviaKlein, Steven C, MD;  Location: [facility] INVASIVE CV LAB;  Service: Cardiovascular;  Laterality: N/A;  . SHOULDER ARTHROSCOPY    . TONSILLECTOMY       Allergies  Allergies  Allergen Reactions  .  Methyldopa-Hydrochlorothiazide Other (See Comments)    GYNOCOMASTIA  . Amoxicillin     rash  . Motrin [Ibuprofen]     Chest swelling    Inpatient Medications    . amLODipine  5 mg Oral Daily  . atorvastatin  40 mg Oral QPM  . enoxaparin (LOVENOX) injection  90 mg Subcutaneous Q24H  . escitalopram  10 mg Oral QPM  . famotidine  40 mg Oral QHS  . furosemide  80 mg Intravenous BID  . gabapentin   600 mg Oral q morning - 10a   And  . gabapentin  1,200 mg Oral QHS  . insulin aspart  0-9 Units Subcutaneous TID WC  . insulin aspart  10 Units Subcutaneous TID WC  . insulin NPH Human  40 Units Subcutaneous BID AC & HS  . losartan  50 mg Oral q morning - 10a  . multivitamin with minerals  1 tablet Oral q morning - 10a  . pantoprazole  40 mg Oral q morning - 10a  . potassium chloride  40 mEq Oral BID    Family History    Family History  Problem Relation Age of Onset  . Cancer Mother   . Diabetes Mother   . Parkinson's disease Father   . Heart failure Father    He indicated that his mother is deceased. He indicated that his father is deceased.   Social History    Social History   Socioeconomic History  . Marital status: Married    Spouse name: Not on file  . Number of children: Not on file  . Years of education: Not on file  . Highest education level: Not on file  Occupational History  . Not on file  Social Needs  . Financial resource strain: Not on file  . Food insecurity:    Worry: Not on file    Inability: Not on file  . Transportation needs:    Medical: Not on file    Non-medical: Not on file  Tobacco Use  . Smoking status: Current Some Day Smoker    Types: Cigars  . Smokeless tobacco: Never Used  Substance and Sexual Activity  . Alcohol use: No    Frequency: Never  . Drug use: No  . Sexual activity: Not on file  Lifestyle  . Physical activity:    Days per week: Not on file    Minutes per session: Not on file  . Stress: Not on file  Relationships  . Social connections:    Talks on phone: Not on file    Gets together: Not on file    Attends religious service: Not on file    Active member of club or organization: Not on file    Attends meetings of clubs or organizations: Not on file    Relationship status: Not on file  . Intimate partner violence:    Fear of current or ex partner: Not on file    Emotionally abused: Not on file    Physically  abused: Not on file    Forced sexual activity: Not on file  Other Topics Concern  . Not on file  Social History Narrative  . Not on file     Review of Systems    Review of Systems  Constitutional: Positive for chills. Negative for fever.  HENT: Positive for sore throat. Negative for congestion.   Respiratory: Positive for cough, sputum production and shortness of breath. Negative for hemoptysis.   Cardiovascular: Positive for orthopnea, leg swelling and  PND. Negative for chest pain and palpitations.  Gastrointestinal: Negative for blood in stool, nausea and vomiting.  Genitourinary: Negative for hematuria.  Musculoskeletal: Positive for back pain (chronic). Negative for myalgias.  Neurological: Negative for dizziness and loss of consciousness.  Endo/Heme/Allergies: Does not bruise/bleed easily.  Psychiatric/Behavioral: Negative for substance abuse.   Physical Exam    Blood pressure 98/71, pulse 94, temperature 98 F (36.7 C), temperature source Oral, resp. rate 19, height 6' (1.829 m), weight (!) 185.2 kg, SpO2 96 %.  General: 68 y.o. morbidly obese male resting comfortably in no acute distress. Pleasant and cooperative. HEENT: Normal  Neck: Supple. No carotid bruits. Difficult to assess JVD due to body habitus. Lungs: No increased work of breathing. Clear to auscultation bilaterally. No wheezes, rhonchi, or rales. Heart: RRR. Distinct S1 and S2. III/VI systolic murmur best heard at right upper sternal border. No gallops or rubs.  Abdomen: Soft, distended, and non-tender to palpation. Bowel sounds present. Extremities: 3+ pitting edema of bilateral lower extremities up to knees. Radial pulses 2+ and equal. Neuro: Alert and oriented x3. No focal deficits. Moves all extremities spontaneously. Psych: Normal affect.  Labs    Troponin (Point of Care Test) Recent Labs    08/10/18 1410  TROPIPOC 0.01   No results for input(s): CKTOTAL, CKMB, TROPONINI in the last 72 hours. Lab  Results  Component Value Date   WBC 7.3 08/11/2018   HGB 11.9 (L) 08/11/2018   HCT 38.3 (L) 08/11/2018   MCV 85.9 08/11/2018   PLT 234 08/11/2018    Recent Labs  Lab 08/10/18 1356 08/11/18 0458  NA 140 142  K 3.3* 4.0  CL 100 102  CO2 30 32  BUN 10 12  CREATININE 0.78 0.95  CALCIUM 8.5* 8.4*  PROT 6.3*  --   BILITOT 1.0  --   ALKPHOS 87  --   ALT 19  --   AST 24  --   GLUCOSE 97 195*   No results found for: CHOL, HDL, LDLCALC, TRIG No results found for: [facility]   Radiology Studies    Dg Chest 2 View  Result Date: 08/10/2018 CLINICAL DATA:  Shortness of breath, weight gain. EXAM: CHEST - 2 VIEW COMPARISON:  Radiographs of August 30, 2017. FINDINGS: Stable cardiomegaly. Increased bilateral pulmonary edema is noted. Mild bibasilar subsegmental atelectasis is now noted with small pleural effusions. No pneumothorax is noted. Bony thorax is unremarkable. IMPRESSION: Bilateral pulmonary edema is noted with probable bibasilar subsegmental atelectasis and small pleural effusions. Electronically Signed   By: Lupita Raider, M.D.   On: 08/10/2018 12:47    EKG     EKG: EKG was personally reviewed and demonstrates: Normal sinus rhythm, 98 bpm, with known right bundle branch block.   Telemetry: Telemetry was personally reviewed and demonstrates: Sinus rhythm with heart rates ranging from the 80's to 120's. Wide QRS consistent with RBBB.  Cardiac Imaging    Echo pending.   Assessment & Plan    Acute Diastolic CHF - Patient presents with worsening dyspnea with minimal exertion, lower extremity and abdominal edema, and a 30lb weight gain in the last month. History, physical exam, and labs consistent with CHF. - Chest x-ray showed bilateral pulmonary edema. - BNP elevated at 290.8. Repeat this morning 361.3. - Echo pending. - Currently IV Lasix 40mg  twice daily. Documented output of 2.2 L since admission. Patient reports he has not urinated since this morning. Will increase to  80mg  twice daily. - Will hold home Losartan  at this time due to soft BP. - Will hold initiation of beta-blocker at this time give acute presentation. - Continue to monitor daily weights, strict I/O's, and renal function/electrolytes while being diuresed.  Hypertension - Most recent BP 98/71. - Will hold home Amlodipine and Losartan so patient can be diuresed.   Hyperlipidemia - Continue statin.   Aortic Stenosis - Mild stenosis noted on last echo in 08/2017.  - Current echo pending.  Signed, Corrin Parkerallie E Goodrich, PA-C 08/11/2018, 5:42 PM  For questions or updates, please contact   Please consult [email or web address] for contact info under Cardiology/STEMI.  I have personally seen and examined this patient with Marjie Skiffallie Goodrich, PA-C. I agree with the assessment and plan as outlined above. I have made changes in the plan where necessary. Mr. Hilma FavorsBowyer has a history of aortic stenosis, heart block while on verapamil, HTN, HLD, morbid obesity admitted with dyspnea and found to be volume overloaded. He has been diuresed with IV Lasix with improvement in dyspnea. EKG with sinus, RBBB-chronic. Reviewed by me. Labs reviewed by me.   My exam: Morbidly obese male. NAD. Abdominal distention-obese. Unable to assess JVD. CV:RRR with systolic murmur. Lungs: decreased BS bases. Ext: 2-3 + bilateral LE edema.   Plan: Continue diuresis. Will increase Lasix to 80 mg IV BID. Echo to assess LVEF and aortic valve. Hold ARB and amlodipine given soft BP. We will follow with you.   Verne CarrowChristopher Kaiyon Hynes 08/11/2018 6:00 PM

## 2018-08-11 NOTE — Plan of Care (Signed)
  Problem: Activity: Goal: Risk for activity intolerance will decrease Outcome: Progressing   Problem: Safety: Goal: Ability to remain free from injury will improve Outcome: Progressing   

## 2018-08-11 NOTE — Progress Notes (Signed)
PROGRESS NOTE  Daniel Valenzuela  XIP:[id number] DOB: 04/14/1951 DOA: 08/10/2018 PCP: Carylon Perches, MD   Brief Narrative: Daniel Valenzuela is a 68 y.o. male with a history of morbid obesity, T2DM, OSA not on CPAP, chronic diastolic CHF, HTN, HLD, and GERD who presented to the ED with 4 weeks of increasing shortness of breath, orthopnea, PND in the setting of ~30lbs weight gain slowly over the past 4-6 weeks initially in legs, but now also in abdomen. He had been evaluated previously for syncope, told to drink plenty of fluid and thinks he may now have been drinking too much fluid. Weights in records show 382lbs in March 2019 and was 408lbs on admission. BNP was elevated and he was grossly overloaded on exam, so was started on lasix and admitted. Cardiology consulted 1/14 and diuresis augmented.   Assessment & Plan: Principal Problem:   Shortness of breath Active Problems:   Diabetic neuropathy (HCC)   Obesity   Chronic back pain   Sleep apnea   Type 2 diabetes mellitus without complication (HCC)   CHF (congestive heart failure) (HCC)  Acute on chronic diastolic CHF, HTN: Massively overloaded, 26lbs above recorded weight in March, reported ~30lbs weight gain.  - Will augment lasix, appreciate cardiology assistance  - Continue ARB. Plan on holding norvasc to make room for diuresis. Depending on echo results, may need BB.  - Repeat echo ordered, not yet done.  OSA:  - CPAP qHS would be helpful  IDT2DM with peripheral neuropathy: Last Hb1c 9.4% - Continue insulin, NPH 40u BID and 10u + sensitive SSI qAC - Update A1c - Continue gabapentin  Morbid obesity:  - Dietitian consulted  HLD: - Continue statin  GERD:  - Continue PPI  Depression:  - Continue SSRI  Chronic back pain:  - Continue home medication  DVT prophylaxis: Weight-dosed lovenox Code Status: Full Family Communication: Wife at bedside Disposition Plan: Home once euvolemic, likely several days.  Consultants:    Cardiology  Procedures:   Echocardiogram pending  Antimicrobials:  None   Subjective: Has not urinated very much today, has severe fullness in lower abdomen and bilateral legs are stable and very swollen.   Objective: Vitals:   08/10/18 2326 08/11/18 0407 08/11/18 1132 08/11/18 1625  BP: 111/79 132/86 98/69 98/71   Pulse: 99 (!) 104 98 94  Resp: 18 19 18 19   Temp: 98 F (36.7 C) 98.3 F (36.8 C) 98.7 F (37.1 C) 98 F (36.7 C)  TempSrc: Oral Oral Oral Oral  SpO2: 94% 91% 92% 96%  Weight:  (!) 185.2 kg    Height:        Intake/Output Summary (Last 24 hours) at 08/11/2018 1726 Last data filed at 08/11/2018 1634 Gross per 24 hour  Intake 640 ml  Output 1000 ml  Net -360 ml   Filed Weights   08/10/18 1729 08/11/18 0407  Weight: (!) 185.1 kg (!) 185.2 kg    Gen: Morbidly obese, pleasant male in no distress  Pulm: Non-labored tachypnea. Crackles at bases.  CV: Regular rate and rhythm. II/VI SEM at base, no other murmur, rub, or gallop. Uncertain JVD, 3+ pitting edema in legs symmetrically extending to dense edema in lower abdomen. GI: Abdomen soft, non-tender, non-distended, with normoactive bowel sounds. No organomegaly or masses felt. Ext: Warm, no deformities Skin: No rashes, lesions or ulcers Neuro: Alert and oriented. No focal neurological deficits. Psych: Judgement and insight appear normal. Mood & affect appropriate.   Data Reviewed: I have personally reviewed following  labs and imaging studies  CBC: Recent Labs  Lab 08/10/18 1356 08/11/18 0458  WBC 7.3 7.3  NEUTROABS 4.9  --   HGB 12.5* 11.9*  HCT 40.5 38.3*  MCV 87.7 85.9  PLT 246 234   Basic Metabolic Panel: Recent Labs  Lab 08/10/18 1356 08/11/18 0458  NA 140 142  K 3.3* 4.0  CL 100 102  CO2 30 32  GLUCOSE 97 195*  BUN 10 12  CREATININE 0.78 0.95  CALCIUM 8.5* 8.4*   GFR: Estimated Creatinine Clearance: 132.2 mL/min (by C-G formula based on SCr of 0.95 mg/dL). Liver Function  Tests: Recent Labs  Lab 08/10/18 1356  AST 24  ALT 19  ALKPHOS 87  BILITOT 1.0  PROT 6.3*  ALBUMIN 3.0*   No results for input(s): LIPASE, AMYLASE in the last 168 hours. No results for input(s): AMMONIA in the last 168 hours. Coagulation Profile: No results for input(s): INR, PROTIME in the last 168 hours. Cardiac Enzymes: No results for input(s): CKTOTAL, CKMB, CKMBINDEX, TROPONINI in the last 168 hours. BNP (last 3 results) No results for input(s): PROBNP in the last 8760 hours. HbA1C: No results for input(s): HGBA1C in the last 72 hours. CBG: Recent Labs  Lab 08/10/18 1659 08/10/18 2100 08/11/18 0750 08/11/18 1206  GLUCAP 139* 173* 156* 195*   Lipid Profile: No results for input(s): CHOL, HDL, LDLCALC, TRIG, CHOLHDL, LDLDIRECT in the last 72 hours. Thyroid Function Tests: No results for input(s): TSH, T4TOTAL, FREET4, T3FREE, THYROIDAB in the last 72 hours. Anemia Panel: No results for input(s): VITAMINB12, FOLATE, FERRITIN, TIBC, IRON, RETICCTPCT in the last 72 hours. Urine analysis: No results found for: COLORURINE, APPEARANCEUR, LABSPEC, PHURINE, GLUCOSEU, HGBUR, BILIRUBINUR, KETONESUR, PROTEINUR, UROBILINOGEN, NITRITE, LEUKOCYTESUR No results found for this or any previous visit (from the past 240 hour(s)).    Radiology Studies: Dg Chest 2 View  Result Date: 08/10/2018 CLINICAL DATA:  Shortness of breath, weight gain. EXAM: CHEST - 2 VIEW COMPARISON:  Radiographs of August 30, 2017. FINDINGS: Stable cardiomegaly. Increased bilateral pulmonary edema is noted. Mild bibasilar subsegmental atelectasis is now noted with small pleural effusions. No pneumothorax is noted. Bony thorax is unremarkable. IMPRESSION: Bilateral pulmonary edema is noted with probable bibasilar subsegmental atelectasis and small pleural effusions. Electronically Signed   By: Lupita Raider, M.D.   On: 08/10/2018 12:47    Scheduled Meds: . amLODipine  5 mg Oral Daily  . atorvastatin  40 mg  Oral QPM  . enoxaparin (LOVENOX) injection  90 mg Subcutaneous Q24H  . escitalopram  10 mg Oral QPM  . famotidine  40 mg Oral QHS  . furosemide  40 mg Intravenous BID  . gabapentin  600 mg Oral q morning - 10a   And  . gabapentin  1,200 mg Oral QHS  . insulin aspart  0-9 Units Subcutaneous TID WC  . insulin aspart  10 Units Subcutaneous TID WC  . insulin NPH Human  40 Units Subcutaneous BID AC & HS  . losartan  50 mg Oral q morning - 10a  . multivitamin with minerals  1 tablet Oral q morning - 10a  . pantoprazole  40 mg Oral q morning - 10a  . potassium chloride  40 mEq Oral BID   Continuous Infusions:   LOS: 1 day   Time spent: 25 minutes.  Tyrone Nine, MD [facility] [email or web address] Password [facility] 08/11/2018, 5:26 PM

## 2018-08-11 NOTE — Progress Notes (Signed)
Pt CBG  60 (1728). Gave 4 oz orange juice. Rechecked after 15 minutes. CBG 86 (1811).

## 2018-08-11 NOTE — Care Management Note (Signed)
Case Management Note  Patient Details  Name: CHEYENNE JANSSEN MRN: [id number] Date of Birth: [birth year]  Subjective/Objective:    Shortness of Breath               Action/Plan: Patient lives at home with spouse; PCP: Carylon Perches, MD; has private insurance with Medicare; CM following for progression of care.  Expected Discharge Date:    possibly 08/11/2018              Expected Discharge Plan:  Home/Self Care  Discharge planning Services  CM Consult  Status of Service:  In process, will continue to follow  Reola Mosher [phone] 08/11/2018, 11:34 AM

## 2018-08-12 ENCOUNTER — Other Ambulatory Visit: Payer: Self-pay

## 2018-08-12 ENCOUNTER — Ambulatory Visit (HOSPITAL_COMMUNITY): Payer: Medicare Other

## 2018-08-12 ENCOUNTER — Encounter (HOSPITAL_COMMUNITY): Payer: Self-pay | Admitting: General Practice

## 2018-08-12 DIAGNOSIS — R0602 Shortness of breath: Secondary | ICD-10-CM

## 2018-08-12 LAB — GLUCOSE, CAPILLARY
Glucose-Capillary: 103 mg/dL — ABNORMAL HIGH (ref 70–99)
Glucose-Capillary: 87 mg/dL (ref 70–99)
Glucose-Capillary: 214 mg/dL — ABNORMAL HIGH (ref 70–99)
Glucose-Capillary: 142 mg/dL — ABNORMAL HIGH (ref 70–99)
Glucose-Capillary: 87 mg/dL (ref 70–99)

## 2018-08-12 LAB — BASIC METABOLIC PANEL
Anion gap: 10 (ref 5–15)
BUN: 11 mg/dL (ref 8–23)
CHLORIDE: 100 mmol/L (ref 98–111)
CO2: 33 mmol/L — AB (ref 22–32)
Calcium: 8.8 mg/dL — ABNORMAL LOW (ref 8.9–10.3)
Creatinine, Ser: 0.86 mg/dL (ref 0.61–1.24)
GFR calc Af Amer: >60 mL/min (ref >60)
GFR calc non Af Amer: >60 mL/min (ref >60)
Glucose, Bld: 104 mg/dL — ABNORMAL HIGH (ref 70–99)
POTASSIUM: 4.1 mmol/L (ref 3.5–5.1)
Sodium: 143 mmol/L (ref 135–145)

## 2018-08-12 LAB — CBC
HEMATOCRIT: 40 % (ref 39.0–52.0)
Hemoglobin: 12.5 g/dL — ABNORMAL LOW (ref 13.0–17.0)
MCH: 27.2 pg (ref 26.0–34.0)
MCHC: 31.3 g/dL (ref 30.0–36.0)
MCV: 87.1 fL (ref 80.0–100.0)
Platelets: 260 10*3/uL (ref 150–400)
RBC: 4.59 MIL/uL (ref 4.22–5.81)
RDW: 15.5 % (ref 11.5–15.5)
WBC: 8.3 10*3/uL (ref 4.0–10.5)
nRBC: 0 % (ref 0.0–0.2)

## 2018-08-12 LAB — HEMOGLOBIN A1C
Hgb A1c MFr Bld: 9 % — ABNORMAL HIGH (ref 4.8–5.6)
Mean Plasma Glucose: 211.6 mg/dL

## 2018-08-12 LAB — ECHOCARDIOGRAM COMPLETE
Height: 72 in
Weight: 6542.4 oz

## 2018-08-12 MED ORDER — PERFLUTREN LIPID MICROSPHERE
1.0000 mL | INTRAVENOUS | Status: AC | PRN
  Administered 2018-08-12: 2 mL via INTRAVENOUS
  Filled 2018-08-12: qty 10

## 2018-08-12 NOTE — Progress Notes (Signed)
PROGRESS NOTE  GREG NORENA  ZOX:[id number] DOB: 03/06/1951 DOA: 08/10/2018 PCP: Carylon Perches, MD   Brief Narrative: Daniel Valenzuela is a 68 y.o. male with a history of morbid obesity, T2DM, OSA not on CPAP, chronic diastolic CHF, HTN, HLD, and GERD who presented to the ED with 4 weeks of increasing shortness of breath, orthopnea, PND in the setting of ~30lbs weight gain slowly over the past 4-6 weeks initially in legs, but now also in abdomen. He had been evaluated previously for syncope, told to drink plenty of fluid and thinks he may now have been drinking too much fluid. Weights in records show 382lbs in March 2019 and was 408lbs on admission. BNP was elevated and he was grossly overloaded on exam, so was started on lasix and admitted. Cardiology consulted 1/14 and diuresis augmented.   Subjective:  denies any chest pain, cough, fever or chills, reports his breathing has improved  Assessment & Plan: Principal Problem:   Shortness of breath Active Problems:   Diabetic neuropathy (HCC)   Obesity   Chronic back pain   Sleep apnea   Type 2 diabetes mellitus without complication (HCC)   CHF (congestive heart failure) (HCC)   Acute on chronic diastolic CHF (congestive heart failure) (HCC)   Nonrheumatic aortic valve stenosis  Acute on chronic diastolic CHF -Significantly volume overloaded, he reports 30 pound weight gain over the last month despite increasing his Lasix as an outpatient, apparently he is not been following low-salt diet during the holidays, most likely contributed to his volume overload . -Continue with current IV Lasix 80 mg twice daily, he is 3.7 L negative since admission, continue with current IV diuresis, likely will need another few days of diuresis for he gets to acceptable level before discharge , -2D echo is done, results are pending  - Continue ARB. Plan on holding norvasc to make room for diuresis. Depending on echo results, may need BB.   OSA:  - CPAP qHS would  be helpful  IDT2DM with peripheral neuropathy: Last Hb1c 9.4% - Continue insulin, NPH 40u BID and 10u + sensitive SSI qAC -A1c is 9 - Continue gabapentin  Morbid obesity:  - Dietitian consulted  HLD: - Continue statin  GERD:  - Continue PPI  Depression:  - Continue SSRI  Chronic back pain:  - Continue home medication  DVT prophylaxis: Weight-dosed lovenox Code Status: Full Family Communication: None at bedside Disposition Plan: Home once euvolemic, likely several days.  Consultants:   Cardiology  Procedures:   Echocardiogram pending  Antimicrobials:  None     Objective: Vitals:   08/11/18 1625 08/11/18 2143 08/12/18 0012 08/12/18 0438  BP: 98/71 97/68 106/80 116/67  Pulse: 94 98 82 88  Resp: 19 20 20 20   Temp: 98 F (36.7 C) 98.9 F (37.2 C) (!) 97.4 F (36.3 C) 97.8 F (36.6 C)  TempSrc: Oral Oral Oral Oral  SpO2: 96% 93% 94% 94%  Weight:    (!) 185.5 kg  Height:        Intake/Output Summary (Last 24 hours) at 08/12/2018 1045 Last data filed at 08/12/2018 0954 Gross per 24 hour  Intake 880 ml  Output 3000 ml  Net -2120 ml   Filed Weights   08/10/18 1729 08/11/18 0407 08/12/18 0438  Weight: (!) 185.1 kg (!) 185.2 kg (!) 185.5 kg    Physical exam:  Awake Alert, Oriented X 3, No new F.N deficits, Normal affect Symmetrical Chest wall movement, Good air movement bilaterally, CTAB RRR,No  Gallops,Rubs or new Murmurs, No Parasternal Heave +ve B.Sounds, Abd Soft, No tenderness, No rebound - guarding or rigidity. No Cyanosis, Clubbing +3  edema, No new Rash or bruise    Data Reviewed: I have personally reviewed following labs and imaging studies  CBC: Recent Labs  Lab 08/10/18 1356 08/11/18 0458 08/12/18 0504  WBC 7.3 7.3 8.3  NEUTROABS 4.9  --   --   HGB 12.5* 11.9* 12.5*  HCT 40.5 38.3* 40.0  MCV 87.7 85.9 87.1  PLT 246 234 260   Basic Metabolic Panel: Recent Labs  Lab 08/10/18 1356 08/11/18 0458 08/12/18 0504  NA 140 142 143    K 3.3* 4.0 4.1  CL 100 102 100  CO2 30 32 33*  GLUCOSE 97 195* 104*  BUN 10 12 11   CREATININE 0.78 0.95 0.86  CALCIUM 8.5* 8.4* 8.8*   GFR: Estimated Creatinine Clearance: 146.3 mL/min (by C-G formula based on SCr of 0.86 mg/dL). Liver Function Tests: Recent Labs  Lab 08/10/18 1356  AST 24  ALT 19  ALKPHOS 87  BILITOT 1.0  PROT 6.3*  ALBUMIN 3.0*   No results for input(s): LIPASE, AMYLASE in the last 168 hours. No results for input(s): AMMONIA in the last 168 hours. Coagulation Profile: No results for input(s): INR, PROTIME in the last 168 hours. Cardiac Enzymes: No results for input(s): CKTOTAL, CKMB, CKMBINDEX, TROPONINI in the last 168 hours. BNP (last 3 results) No results for input(s): PROBNP in the last 8760 hours. HbA1C: Recent Labs    08/12/18 0504  HGBA1C 9.0*   CBG: Recent Labs  Lab 08/11/18 1728 08/11/18 1811 08/11/18 2211 08/12/18 0139 08/12/18 0723  GLUCAP 60* 86 118* 103* 87   Lipid Profile: No results for input(s): CHOL, HDL, LDLCALC, TRIG, CHOLHDL, LDLDIRECT in the last 72 hours. Thyroid Function Tests: No results for input(s): TSH, T4TOTAL, FREET4, T3FREE, THYROIDAB in the last 72 hours. Anemia Panel: No results for input(s): VITAMINB12, FOLATE, FERRITIN, TIBC, IRON, RETICCTPCT in the last 72 hours. Urine analysis: No results found for: COLORURINE, APPEARANCEUR, LABSPEC, PHURINE, GLUCOSEU, HGBUR, BILIRUBINUR, KETONESUR, PROTEINUR, UROBILINOGEN, NITRITE, LEUKOCYTESUR No results found for this or any previous visit (from the past 240 hour(s)).    Radiology Studies: Dg Chest 2 View  Result Date: 08/10/2018 CLINICAL DATA:  Shortness of breath, weight gain. EXAM: CHEST - 2 VIEW COMPARISON:  Radiographs of August 30, 2017. FINDINGS: Stable cardiomegaly. Increased bilateral pulmonary edema is noted. Mild bibasilar subsegmental atelectasis is now noted with small pleural effusions. No pneumothorax is noted. Bony thorax is unremarkable. IMPRESSION:  Bilateral pulmonary edema is noted with probable bibasilar subsegmental atelectasis and small pleural effusions. Electronically Signed   By: Lupita Raider, M.D.   On: 08/10/2018 12:47    Scheduled Meds: . atorvastatin  40 mg Oral QPM  . enoxaparin (LOVENOX) injection  90 mg Subcutaneous Q24H  . escitalopram  10 mg Oral QPM  . famotidine  40 mg Oral QHS  . furosemide  80 mg Intravenous BID  . gabapentin  600 mg Oral q morning - 10a   And  . gabapentin  1,200 mg Oral QHS  . insulin aspart  0-9 Units Subcutaneous TID WC  . insulin aspart  10 Units Subcutaneous TID WC  . insulin NPH Human  40 Units Subcutaneous BID AC & HS  . multivitamin with minerals  1 tablet Oral q morning - 10a  . pantoprazole  40 mg Oral q morning - 10a  . potassium chloride  40 mEq  Oral BID   Continuous Infusions:   LOS: 2 days    Huey Bienenstock, MD [facility] [email or web address] Password [email or web address] 08/12/2018, 10:45 AM

## 2018-08-12 NOTE — Progress Notes (Signed)
Progress Note  Patient Name: Daniel Valenzuela Date of Encounter: 08/12/2018  Primary Cardiologist: No primary care provider on file. Dr. Ladona Ridgel  Subjective   Breathing is improved. NO pain  Inpatient Medications    Scheduled Meds: . atorvastatin  40 mg Oral QPM  . enoxaparin (LOVENOX) injection  90 mg Subcutaneous Q24H  . escitalopram  10 mg Oral QPM  . famotidine  40 mg Oral QHS  . furosemide  80 mg Intravenous BID  . gabapentin  600 mg Oral q morning - 10a   And  . gabapentin  1,200 mg Oral QHS  . insulin aspart  0-9 Units Subcutaneous TID WC  . insulin aspart  10 Units Subcutaneous TID WC  . insulin NPH Human  40 Units Subcutaneous BID AC & HS  . multivitamin with minerals  1 tablet Oral q morning - 10a  . pantoprazole  40 mg Oral q morning - 10a  . potassium chloride  40 mEq Oral BID   Continuous Infusions:  PRN Meds: acetaminophen **OR** acetaminophen, HYDROcodone-acetaminophen   Vital Signs    Vitals:   08/11/18 1625 08/11/18 2143 08/12/18 0012 08/12/18 0438  BP: 98/71 97/68 106/80 116/67  Pulse: 94 98 82 88  Resp: 19 20 20 20   Temp: 98 F (36.7 C) 98.9 F (37.2 C) (!) 97.4 F (36.3 C) 97.8 F (36.6 C)  TempSrc: Oral Oral Oral Oral  SpO2: 96% 93% 94% 94%  Weight:    (!) 185.5 kg  Height:        Intake/Output Summary (Last 24 hours) at 08/12/2018 0945 Last data filed at 08/12/2018 0708 Gross per 24 hour  Intake 640 ml  Output 2400 ml  Net -1760 ml   Last 3 Weights 08/12/2018 08/11/2018 08/10/2018  Weight (lbs) 408 lb 14.4 oz 408 lb 3.2 oz 408 lb  Weight (kg) 185.476 kg 185.158 kg 185.068 kg      Telemetry    sinus - Personally Reviewed  ECG    No am EKG - Personally Reviewed  Physical Exam   GEN: Morbidly obese male in NAD Neck: No JVD Cardiac: RRR, systolic murmur noted.   Respiratory: Clear to auscultation bilaterally. GI: Soft, nontender, non-distended  Ext: 3+ bilateral LE edema.  Neuro:  Nonfocal  Psych: Normal affect   Labs    Chemistry Recent Labs  Lab 08/10/18 1356 08/11/18 0458 08/12/18 0504  NA 140 142 143  K 3.3* 4.0 4.1  CL 100 102 100  CO2 30 32 33*  GLUCOSE 97 195* 104*  BUN 10 12 11   CREATININE 0.78 0.95 0.86  CALCIUM 8.5* 8.4* 8.8*  PROT 6.3*  --   --   ALBUMIN 3.0*  --   --   AST 24  --   --   ALT 19  --   --   ALKPHOS 87  --   --   BILITOT 1.0  --   --   GFRNONAA >60 >60 >60  GFRAA >60 >60 >60  ANIONGAP 10 8 10      Hematology Recent Labs  Lab 08/10/18 1356 08/11/18 0458 08/12/18 0504  WBC 7.3 7.3 8.3  RBC 4.62 4.46 4.59  HGB 12.5* 11.9* 12.5*  HCT 40.5 38.3* 40.0  MCV 87.7 85.9 87.1  MCH 27.1 26.7 27.2  MCHC 30.9 31.1 31.3  RDW 15.5 15.4 15.5  PLT 246 234 260    Cardiac EnzymesNo results for input(s): TROPONINI in the last 168 hours.  Recent Labs  Lab 08/10/18 1410  TROPIPOC 0.01     BNP Recent Labs  Lab 08/10/18 1356 08/11/18 0458  BNP 290.8* 361.3*     DDimer No results for input(s): DDIMER in the last 168 hours.   Radiology    Dg Chest 2 View  Result Date: 08/10/2018 CLINICAL DATA:  Shortness of breath, weight gain. EXAM: CHEST - 2 VIEW COMPARISON:  Radiographs of August 30, 2017. FINDINGS: Stable cardiomegaly. Increased bilateral pulmonary edema is noted. Mild bibasilar subsegmental atelectasis is now noted with small pleural effusions. No pneumothorax is noted. Bony thorax is unremarkable. IMPRESSION: Bilateral pulmonary edema is noted with probable bibasilar subsegmental atelectasis and small pleural effusions. Electronically Signed   By: Lupita Raider, M.D.   On: 08/10/2018 12:47    Cardiac Studies     Patient Profile     68 y.o. male with history of HTN, HLD, DM, obesity, aortic stenosis and sleep apnea admitted with acute CHF  Assessment & Plan    1. Acute diastolic CHF: He is diuresing well on IV Lasix. Net negative 3.7 liters since admission. He is still volume overloaded. Continue diuresis with IV Lasix. Echo pending to assess LV  function  2. Aortic stenosis: Echo today to assess   For questions or updates, please contact [facility] Please consult [email or web address] for contact info under        Signed, Verne Carrow, MD  08/12/2018, 9:45 AM

## 2018-08-12 NOTE — Plan of Care (Signed)
  Problem: Activity: Goal: Risk for activity intolerance will decrease Outcome: Progressing   Problem: Elimination: Goal: Will not experience complications related to bowel motility Outcome: Progressing   Problem: Safety: Goal: Ability to remain free from injury will improve Outcome: Progressing   

## 2018-08-12 NOTE — Plan of Care (Signed)
  Problem: Activity: Goal: Risk for activity intolerance will decrease Outcome: Progressing   Problem: Elimination: Goal: Will not experience complications related to bowel motility Outcome: Progressing   Problem: Safety: Goal: Ability to remain free from injury will improve Outcome: Progressing   Problem: Activity: Goal: Capacity to carry out activities will improve Outcome: Progressing   

## 2018-08-12 NOTE — Plan of Care (Signed)
Nutrition Education Note  RD consulted for nutrition education regarding CHF and diabetes.  Lab Results  Component Value Date   HGBA1C 9.0 (H) 08/12/2018  PTA DM medications are 40 units insulin NPH BID and 15-20 units regular insulin BID. Of note, pt reports recently adjust medications due to lack of affordability (in Medicare donut hole); has been using [employer] brand of insulin currently.    Labs reviewed: CBGS: 87-214 (inpatient orders for glycemic control are 0-9 units insulin aspart TID, 10 units insulin aspart TID, and 40 units insulin NPH BID).    Spoke with pt, who expressed frustration over fluid overload despite recent attempts at lifestyle modifications. Pt reports he and his wife were doing intermittent fasting over the past 3 weeks to assist with weight loss, DM control, and CHF management. Pt consumes 2 meals per day- boiled egg in the morning and cauliflower rice, spinach, and beans for dinner. Pt reports that he was trying to also decrease sodium in his diet. He does not weigh himself daily due to not having a scale that accomodates him. He has also cut out potatoes and rice, and endorses using sugar free products, such as puddings and syrups. RD emphasized ways pt could season foods without addition of salt, ways to decrease carbohydrate intake, and importance of self-management to better control DM and CHF to prevent further complications.   RD provided "Heart Healthy, Consistent Carbohydrate Nutrition Therapy" handout from the Academy of [employer]. Reviewed patient's dietary recall. Provided examples on ways to decrease sodium intake in diet. Discouraged intake of processed foods and use of salt shaker. Encouraged fresh fruits and vegetables as well as whole grain sources of carbohydrates to maximize fiber intake.   RD discussed why it is important for patient to adhere to diet recommendations, and emphasized the role of fluids, foods to avoid, and importance of  weighing self daily.   Discussed different food groups and their effects on blood sugar, emphasizing carbohydrate-containing foods. Provided list of carbohydrates and recommended serving sizes of common foods.  Discussed importance of controlled and consistent carbohydrate intake throughout the day. Provided examples of ways to balance meals/snacks and encouraged intake of high-fiber, whole grain complex carbohydrates. Teach back method used.  Expect fair to good compliance.  Body mass index is 55.46 kg/m. Pt meets criteria for extreme obesity, class III based on current BMI.  Current diet order is Heart Healthy, patient is consuming approximately 100% of meals at this time. Labs and medications reviewed. No further nutrition interventions warranted at this time. RD contact information provided. If additional nutrition issues arise, please re-consult RD.   Florella Mcneese A. Mayford Knife, RD, LDN, CDE Pager: [phone] After hours Pager: [phone]

## 2018-08-12 NOTE — Progress Notes (Signed)
Pt. Requesting to hold off on scheduled insulin dose. On call for [facility] paged to make aware.

## 2018-08-12 NOTE — Progress Notes (Signed)
  Echocardiogram 2D Echocardiogram has been performed.  Pieter Partridge 08/12/2018, 2:00 PM

## 2018-08-13 ENCOUNTER — Inpatient Hospital Stay (HOSPITAL_COMMUNITY): Payer: Medicare Other

## 2018-08-13 DIAGNOSIS — R0602 Shortness of breath: Secondary | ICD-10-CM

## 2018-08-13 DIAGNOSIS — I5043 Acute on chronic combined systolic (congestive) and diastolic (congestive) heart failure: Secondary | ICD-10-CM

## 2018-08-13 DIAGNOSIS — I351 Nonrheumatic aortic (valve) insufficiency: Secondary | ICD-10-CM

## 2018-08-13 LAB — ECHOCARDIOGRAM LIMITED
Height: 72 in
WEIGHTICAEL: 6456 [oz_av]

## 2018-08-13 LAB — CBC
HCT: 38 % — ABNORMAL LOW (ref 39.0–52.0)
Hemoglobin: 11.6 g/dL — ABNORMAL LOW (ref 13.0–17.0)
MCH: 26.4 pg (ref 26.0–34.0)
MCHC: 30.5 g/dL (ref 30.0–36.0)
MCV: 86.4 fL (ref 80.0–100.0)
Platelets: 225 10*3/uL (ref 150–400)
RBC: 4.4 MIL/uL (ref 4.22–5.81)
RDW: 15.4 % (ref 11.5–15.5)
WBC: 7.1 10*3/uL (ref 4.0–10.5)
nRBC: 0 % (ref 0.0–0.2)

## 2018-08-13 LAB — BASIC METABOLIC PANEL
Anion gap: 10 (ref 5–15)
BUN: 10 mg/dL (ref 8–23)
CHLORIDE: 103 mmol/L (ref 98–111)
CO2: 30 mmol/L (ref 22–32)
CREATININE: 0.8 mg/dL (ref 0.61–1.24)
Calcium: 8.6 mg/dL — ABNORMAL LOW (ref 8.9–10.3)
GFR calc Af Amer: >60 mL/min (ref >60)
GFR calc non Af Amer: >60 mL/min (ref >60)
Glucose, Bld: 94 mg/dL (ref 70–99)
Potassium: 3.7 mmol/L (ref 3.5–5.1)
Sodium: 143 mmol/L (ref 135–145)

## 2018-08-13 LAB — GLUCOSE, CAPILLARY
Glucose-Capillary: 94 mg/dL (ref 70–99)
Glucose-Capillary: 182 mg/dL — ABNORMAL HIGH (ref 70–99)
Glucose-Capillary: 180 mg/dL — ABNORMAL HIGH (ref 70–99)
Glucose-Capillary: 105 mg/dL — ABNORMAL HIGH (ref 70–99)

## 2018-08-13 NOTE — Care Management (Signed)
#  8.   S/W MARIO  @ [employer] # [phone]   ENTRESTO 49/ 50 MG BID COVER- YES CO-PAY- $ 243.71 TIER- 3 DRUG PRIOR APPROVAL- YES  # [phone]  DEDUCTIBLE : NOT MET  PREFERRED PHARMACY :  YES [facility]

## 2018-08-13 NOTE — Progress Notes (Signed)
Patient had 5 runs of vtach. Attending page via amion. Patient sitting comfortably on the side of the bed watching television. Asymptomatic.

## 2018-08-13 NOTE — Progress Notes (Addendum)
Progress Note  Patient Name: Daniel Valenzuela Date of Encounter: 08/13/2018  Primary Cardiologist: Lewayne Bunting, MD   Subjective   Pt says that breathing is better and his lower extremity edema and abdominal tightness has improved, but still considerable. He has had no lightheadedness or chest discomfort.   Inpatient Medications    Scheduled Meds: . atorvastatin  40 mg Oral QPM  . enoxaparin (LOVENOX) injection  90 mg Subcutaneous Q24H  . escitalopram  10 mg Oral QPM  . famotidine  40 mg Oral QHS  . furosemide  80 mg Intravenous BID  . gabapentin  600 mg Oral q morning - 10a   And  . gabapentin  1,200 mg Oral QHS  . insulin aspart  0-9 Units Subcutaneous TID WC  . insulin aspart  10 Units Subcutaneous TID WC  . insulin NPH Human  40 Units Subcutaneous BID AC & HS  . multivitamin with minerals  1 tablet Oral q morning - 10a  . pantoprazole  40 mg Oral q morning - 10a  . potassium chloride  40 mEq Oral BID   Continuous Infusions:  PRN Meds: acetaminophen **OR** acetaminophen, HYDROcodone-acetaminophen   Vital Signs    Vitals:   08/12/18 0438 08/12/18 1152 08/12/18 1944 08/13/18 0402  BP: 116/67 118/66 113/86 110/72  Pulse: 88 100 100 82  Resp: 20 18 18 20   Temp: 97.8 F (36.6 C) 97.7 F (36.5 C) (!) 97.4 F (36.3 C) 98 F (36.7 C)  TempSrc: Oral Oral Oral Oral  SpO2: 94% 93% 92% 92%  Weight: (!) 185.5 kg   (!) 183 kg  Height:        Intake/Output Summary (Last 24 hours) at 08/13/2018 0804 Last data filed at 08/12/2018 2343 Gross per 24 hour  Intake 720 ml  Output 1650 ml  Net -930 ml   Last 3 Weights 08/13/2018 08/12/2018 08/11/2018  Weight (lbs) 403 lb 8 oz 408 lb 14.4 oz 408 lb 3.2 oz  Weight (kg) 183.026 kg 185.476 kg 185.158 kg      Telemetry    Accelerated junctional rhythm in the 90's, 5 beats of NSVT yesterday - Personally Reviewed  ECG    No new tracings - Personally Reviewed  Physical Exam   GEN: Obese male, No acute distress.   Neck: No  JVD Cardiac: RRR, 3/6 harsh, high pitched systolic murmur at RUSB radiating to the carotids Respiratory: Clear to auscultation bilaterally. GI: Mildly firm in the lower abdomen MS: 1+ lower leg edema up to knees Neuro:  Nonfocal  Psych: Normal affect   Labs    Chemistry Recent Labs  Lab 08/10/18 1356 08/11/18 0458 08/12/18 0504 08/13/18 0627  NA 140 142 143 143  K 3.3* 4.0 4.1 3.7  CL 100 102 100 103  CO2 30 32 33* 30  GLUCOSE 97 195* 104* 94  BUN 10 12 11 10   CREATININE 0.78 0.95 0.86 0.80  CALCIUM 8.5* 8.4* 8.8* 8.6*  PROT 6.3*  --   --   --   ALBUMIN 3.0*  --   --   --   AST 24  --   --   --   ALT 19  --   --   --   ALKPHOS 87  --   --   --   BILITOT 1.0  --   --   --   GFRNONAA >60 >60 >60 >60  GFRAA >60 >60 >60 >60  ANIONGAP 10 8 10  10  Hematology Recent Labs  Lab 08/11/18 0458 08/12/18 0504 08/13/18 0627  WBC 7.3 8.3 7.1  RBC 4.46 4.59 4.40  HGB 11.9* 12.5* 11.6*  HCT 38.3* 40.0 38.0*  MCV 85.9 87.1 86.4  MCH 26.7 27.2 26.4  MCHC 31.1 31.3 30.5  RDW 15.4 15.5 15.4  PLT 234 260 225    Cardiac EnzymesNo results for input(s): TROPONINI in the last 168 hours.  Recent Labs  Lab 08/10/18 1410  TROPIPOC 0.01     BNP Recent Labs  Lab 08/10/18 1356 08/11/18 0458  BNP 290.8* 361.3*     DDimer No results for input(s): DDIMER in the last 168 hours.   Radiology    No results found.  Cardiac Studies   Echocardiogram 08/12/2018 Study Conclusions - Left ventricle: The cavity size was mildly dilated. Wall   thickness was normal. Systolic function was moderately reduced.   The estimated ejection fraction was in the range of 35% to 40%. - Aortic valve: There was moderate to severe stenosis. Valve area   (VTI): 4.42 cm^2. Valve area (Vmax): 3.67 cm^2. Valve area   (Vmean): 4.6 cm^2. - Left atrium: The atrium was mildly dilated. - Right atrium: The atrium was severely dilated.  Impressions: - The echo is technically difficult with poor image  quality .  Echocardiogram 08/31/2017 Study Conclusions - Procedure narrative: Transthoracic echocardiography. Image   quality was poor. Intravenous contrast (Definity) was   administered. - Left ventricle: The cavity size was normal. Wall thickness was   increased in a pattern of mild LVH. Systolic function was normal.   The estimated ejection fraction was in the range of 60% to 65%.   Wall motion was normal; there were no regional wall motion   abnormalities. Left ventricular diastolic function parameters   were normal. - Aortic valve: Moderately calcified annulus. Trileaflet; mildly   thickened, mildly calcified leaflets. There was mild stenosis.   Peak velocity (S): 254 cm/s. Mean gradient (S): 15 mm Hg. Valve   area (VTI): 1.84 cm^2. Valve area (Vmax): 1.81 cm^2. Valve area   (Vmean): 1.65 cm^2. - Mitral valve: Moderately calcified annulus.   Patient Profile     68 y.o. male history of HTN, HLD, DM, obesity, aortic stenosis and sleep apnea admitted with acute CHF. Pt had 30# wt gain over the last month, dyspnea on min exertion and lower leg edema/abdominal fullness.  Assessment & Plan    Acute combined diastolic and systolic heart failure -Echo shows significantly decreased LV function with EF 35-40% (this echo was technically difficult with poor image quality) down from EF 60-65% in 08/2017.  Also noted to have moderate-severe aortic stenosis. -Patient has been diuresing with Lasix 80 mg IV twice daily -Weight is down only 3 pounds, but patient had a 2.3 L urine output yesterday and is net -4.7 L since admission. -Systolic blood pressure 106- 118.  With aortic stenosis, need to avoid hypotension.  Patient is not on any other blood pressure lowering medicine. -With worsened AS and newly reduced EF, he may need cardiac cath to further assess valve and evaluate coronary arteries. Will discuss with Dr. Clifton James.   Aortic stenosis -Echo done yesterday shows moderate-severe aortic  stenosis with mean gradient of 33 mmHg, peak gradient 71 mmHg.  Echo in 08/2017 showed mild aortic stenosis with mean gradient of 15 mmHg.  -As above pt may need cardiac cath vs TEE to further evaluate valve.  -Of note that pt had syncope in early 2019 with dehydration and low BP.  His AS may have been underrepresented in setting of low flow at the time. He had one further episode of syncope right after his hospitalization, but none since. No lightheadedness.   For questions or updates, please contact [facility] Please consult [email or web address] for contact info under     Signed, Berton BonJanine Hammond, NP  08/13/2018, 8:04 AM    I have personally seen and examined this patient with Berton BonJanine Hammond, NP. I agree with the assessment and plan as outlined above. I have made changes in the plan where necessary.  Mr. Hilma FavorsBowyer is admitted with acute systolic. Echo yesterday with poor image quality but overall LVEF appears to be reduced. I would estimate his LVEF around 40-45%. His aortic stenosis appears to have progressed since his evaluation with the echo one year ago. Mean gradient in the mid 30s but possibly underestimated due to LV systolic dysfunction with low flow, low gradient AS. The aortic valve data in the echo report seems to be inconsistent. I reviewed the report with Dr. Elease HashimotoNahser who read the study. Will repeat the echo today per recommendation of Dr. Elease HashimotoNahser. The patient is ok with doing this.  He is diuresing well. He is still volume overloaded. Will continue IV Lasix today. He will need a right and left heart catheterization prior to discharge to further evaluate his aortic valve and exclude CAD given reduced LV systolic dysfunction. I would prefer to continue with diuresis and optimize his fluid status prior to the cath. Possible cath tomorrow.   Verne CarrowChristopher Rosbel Buckner 08/13/2018 9:27 AM

## 2018-08-13 NOTE — Progress Notes (Signed)
  Echocardiogram 2D Echocardiogram has been performed.  Leta Jungling M 08/13/2018, 12:07 PM

## 2018-08-13 NOTE — Care Management Important Message (Signed)
Important Message  Patient Details  Name: JEANMARC GARCIAMARTINEZ MRN: [id number] Date of Birth: [birth year]   Medicare Important Message Given:  Yes    Berlene Dixson P Kelly Ranieri 08/13/2018, 4:06 PM

## 2018-08-13 NOTE — Progress Notes (Signed)
PROGRESS NOTE  Daniel Valenzuela  SNK:[id number] DOB: 12-Nov-1950 DOA: 08/10/2018 PCP: Carylon Perches, MD   Brief Narrative: Daniel Valenzuela is a 68 y.o. male with a history of morbid obesity, T2DM, OSA not on CPAP, chronic diastolic CHF, HTN, HLD, and GERD who presented to the ED with 4 weeks of increasing shortness of breath, orthopnea, PND in the setting of ~30lbs weight gain slowly over the past 4-6 weeks initially in legs, but now also in abdomen. He had been evaluated previously for syncope, told to drink plenty of fluid and thinks he may now have been drinking too much fluid. Weights in records show 382lbs in March 2019 and was 408lbs on admission. BNP was elevated and he was grossly overloaded on exam, so was started on lasix and admitted. Cardiology consulted 1/14 and diuresis augmented.  2D echo showing drop in his EF from 60% to 35%.  Subjective:  denies any chest pain, cough, fever or chills, reports his breathing has improved  Assessment & Plan: Principal Problem:   Shortness of breath Active Problems:   Diabetic neuropathy (HCC)   Obesity   Chronic back pain   Sleep apnea   Type 2 diabetes mellitus without complication (HCC)   CHF (congestive heart failure) (HCC)   Acute on chronic diastolic CHF (congestive heart failure) (HCC)   Nonrheumatic aortic valve stenosis  Acute on chronic diastolic/diastolic CHF -Significantly volume overloaded, he reports 30 pound weight gain over the last month despite increasing his Lasix as an outpatient, apparently he is not been following low-salt diet during the holidays, most likely contributed to his volume overload . -Patient is diuresing well with current dose of IV Lasix, he is 4.7 L since admission, dropped from 408 to  403 pounds,. -Repeat 2D echo showing drop in EF to 35%, cardiology input greatly appreciated, likely will need right/left cardiac cath before discharge. - Continue ARB. Plan on holding norvasc to make room for diuresis.  Depending on echo results, may need BB.   OSA:  - CPAP qHS would be helpful  Aortic stenosis -Commendation per cardiology  IDT2DM with peripheral neuropathy: Last Hb1c 9.4% - Continue insulin, NPH 40u BID and 10u + sensitive SSI qAC -A1c is 9 - Continue gabapentin  Morbid obesity:  - Dietitian consulted  HLD: - Continue statin  GERD:  - Continue PPI  Depression:  - Continue SSRI  Chronic back pain:  - Continue home medication  DVT prophylaxis: Weight-dosed lovenox Code Status: Full Family Communication: None at bedside Disposition Plan: Home once euvolemic, likely several days.  Consultants:   Cardiology  Procedures:   Echocardiogram   Antimicrobials:  None     Objective: Vitals:   08/12/18 0438 08/12/18 1152 08/12/18 1944 08/13/18 0402  BP: 116/67 118/66 113/86 110/72  Pulse: 88 100 100 82  Resp: 20 18 18 20   Temp: 97.8 F (36.6 C) 97.7 F (36.5 C) (!) 97.4 F (36.3 C) 98 F (36.7 C)  TempSrc: Oral Oral Oral Oral  SpO2: 94% 93% 92% 92%  Weight: (!) 185.5 kg   (!) 183 kg  Height:        Intake/Output Summary (Last 24 hours) at 08/13/2018 1000 Last data filed at 08/13/2018 0940 Gross per 24 hour  Intake 480 ml  Output 2000 ml  Net -1520 ml   Filed Weights   08/11/18 0407 08/12/18 0438 08/13/18 0402  Weight: (!) 185.2 kg (!) 185.5 kg (!) 183 kg    Physical exam:  Awake Alert, Oriented X  3, No new F.N deficits, Normal affect Symmetrical Chest wall movement, Minister entry at the bases, CTAB RRR,No Gallops,Rubs , +sys  Murmurs, No Parasternal Heave +ve B.Sounds, Abd Soft, No tenderness, No rebound - guarding or rigidity. No Cyanosis, Clubbing , +2 edema, No new Rash or bruise    Data Reviewed: I have personally reviewed following labs and imaging studies  CBC: Recent Labs  Lab 08/10/18 1356 08/11/18 0458 08/12/18 0504 08/13/18 0627  WBC 7.3 7.3 8.3 7.1  NEUTROABS 4.9  --   --   --   HGB 12.5* 11.9* 12.5* 11.6*  HCT 40.5 38.3*  40.0 38.0*  MCV 87.7 85.9 87.1 86.4  PLT 246 234 260 225   Basic Metabolic Panel: Recent Labs  Lab 08/10/18 1356 08/11/18 0458 08/12/18 0504 08/13/18 0627  NA 140 142 143 143  K 3.3* 4.0 4.1 3.7  CL 100 102 100 103  CO2 30 32 33* 30  GLUCOSE 97 195* 104* 94  BUN 10 12 11 10   CREATININE 0.78 0.95 0.86 0.80  CALCIUM 8.5* 8.4* 8.8* 8.6*   GFR: Estimated Creatinine Clearance: 156 mL/min (by C-G formula based on SCr of 0.8 mg/dL). Liver Function Tests: Recent Labs  Lab 08/10/18 1356  AST 24  ALT 19  ALKPHOS 87  BILITOT 1.0  PROT 6.3*  ALBUMIN 3.0*   No results for input(s): LIPASE, AMYLASE in the last 168 hours. No results for input(s): AMMONIA in the last 168 hours. Coagulation Profile: No results for input(s): INR, PROTIME in the last 168 hours. Cardiac Enzymes: No results for input(s): CKTOTAL, CKMB, CKMBINDEX, TROPONINI in the last 168 hours. BNP (last 3 results) No results for input(s): PROBNP in the last 8760 hours. HbA1C: Recent Labs    08/12/18 0504  HGBA1C 9.0*   CBG: Recent Labs  Lab 08/12/18 0723 08/12/18 1122 08/12/18 1639 08/12/18 2110 08/13/18 0745  GLUCAP 87 214* 142* 87 94   Lipid Profile: No results for input(s): CHOL, HDL, LDLCALC, TRIG, CHOLHDL, LDLDIRECT in the last 72 hours. Thyroid Function Tests: No results for input(s): TSH, T4TOTAL, FREET4, T3FREE, THYROIDAB in the last 72 hours. Anemia Panel: No results for input(s): VITAMINB12, FOLATE, FERRITIN, TIBC, IRON, RETICCTPCT in the last 72 hours. Urine analysis: No results found for: COLORURINE, APPEARANCEUR, LABSPEC, PHURINE, GLUCOSEU, HGBUR, BILIRUBINUR, KETONESUR, PROTEINUR, UROBILINOGEN, NITRITE, LEUKOCYTESUR No results found for this or any previous visit (from the past 240 hour(s)).    Radiology Studies: No results found.  Scheduled Meds: . atorvastatin  40 mg Oral QPM  . enoxaparin (LOVENOX) injection  90 mg Subcutaneous Q24H  . escitalopram  10 mg Oral QPM  . famotidine   40 mg Oral QHS  . furosemide  80 mg Intravenous BID  . gabapentin  600 mg Oral q morning - 10a   And  . gabapentin  1,200 mg Oral QHS  . insulin aspart  0-9 Units Subcutaneous TID WC  . insulin aspart  10 Units Subcutaneous TID WC  . insulin NPH Human  40 Units Subcutaneous BID AC & HS  . multivitamin with minerals  1 tablet Oral q morning - 10a  . pantoprazole  40 mg Oral q morning - 10a  . potassium chloride  40 mEq Oral BID   Continuous Infusions:   LOS: 3 days    Huey Bienenstockawood Russia Scheiderer, MD [facility] [email or web address] Password [email or web address] 08/13/2018, 10:00 AM

## 2018-08-14 ENCOUNTER — Encounter (HOSPITAL_COMMUNITY)
Admission: EM | Disposition: A | Discharge status: Home / Self Care | Payer: Self-pay | Source: Home / Self Care | Attending: Internal Medicine

## 2018-08-14 HISTORY — PX: RIGHT/LEFT HEART CATH AND CORONARY ANGIOGRAPHY: CATH118266

## 2018-08-14 LAB — POCT I-STAT 3, ART BLOOD GAS (G3+)
Acid-Base Excess: 7 mmol/L — ABNORMAL HIGH (ref 0.0–2.0)
Bicarbonate: 33 mmol/L — ABNORMAL HIGH (ref 20.0–28.0)
O2 SAT: 96 %
TCO2: 35 mmol/L — AB (ref 22–32)
pCO2 arterial: 53.3 mmHg — ABNORMAL HIGH (ref 32.0–48.0)
pH, Arterial: 7.4 (ref 7.350–7.450)
pO2, Arterial: 84 mmHg (ref 83.0–108.0)

## 2018-08-14 LAB — CBC
HEMATOCRIT: 39.3 % (ref 39.0–52.0)
Hemoglobin: 12.5 g/dL — ABNORMAL LOW (ref 13.0–17.0)
MCH: 27.6 pg (ref 26.0–34.0)
MCHC: 31.8 g/dL (ref 30.0–36.0)
MCV: 86.8 fL (ref 80.0–100.0)
Platelets: 263 10*3/uL (ref 150–400)
RBC: 4.53 MIL/uL (ref 4.22–5.81)
RDW: 15.5 % (ref 11.5–15.5)
WBC: 8.3 10*3/uL (ref 4.0–10.5)
nRBC: 0 % (ref 0.0–0.2)

## 2018-08-14 LAB — POCT I-STAT 3, VENOUS BLOOD GAS (G3P V)
Acid-Base Excess: 8 mmol/L — ABNORMAL HIGH (ref 0.0–2.0)
Bicarbonate: 34.2 mmol/L — ABNORMAL HIGH (ref 20.0–28.0)
O2 Saturation: 65 %
PO2 VEN: 34 mmHg (ref 32.0–45.0)
TCO2: 36 mmol/L — ABNORMAL HIGH (ref 22–32)
pCO2, Ven: 54.7 mmHg (ref 44.0–60.0)
pH, Ven: 7.404 (ref 7.250–7.430)

## 2018-08-14 LAB — BASIC METABOLIC PANEL
Anion gap: 9 (ref 5–15)
BUN: 9 mg/dL (ref 8–23)
CO2: 34 mmol/L — ABNORMAL HIGH (ref 22–32)
Calcium: 9 mg/dL (ref 8.9–10.3)
Chloride: 102 mmol/L (ref 98–111)
Creatinine, Ser: 0.79 mg/dL (ref 0.61–1.24)
GFR calc non Af Amer: >60 mL/min (ref >60)
Glucose, Bld: 107 mg/dL — ABNORMAL HIGH (ref 70–99)
Potassium: 4.6 mmol/L (ref 3.5–5.1)
Sodium: 145 mmol/L (ref 135–145)

## 2018-08-14 LAB — GLUCOSE, CAPILLARY
Glucose-Capillary: 110 mg/dL — ABNORMAL HIGH (ref 70–99)
Glucose-Capillary: 193 mg/dL — ABNORMAL HIGH (ref 70–99)
Glucose-Capillary: 226 mg/dL — ABNORMAL HIGH (ref 70–99)
Glucose-Capillary: 92 mg/dL (ref 70–99)
Glucose-Capillary: 93 mg/dL (ref 70–99)

## 2018-08-14 SURGERY — RIGHT/LEFT HEART CATH AND CORONARY ANGIOGRAPHY
Anesthesia: LOCAL

## 2018-08-14 MED ORDER — VERAPAMIL HCL 2.5 MG/ML IV SOLN
INTRAVENOUS | Status: AC
  Filled 2018-08-14: qty 2

## 2018-08-14 MED ORDER — SODIUM CHLORIDE 0.9% FLUSH: 3.0000 mL | INTRAVENOUS | Status: DC | PRN

## 2018-08-14 MED ORDER — HEPARIN (PORCINE) IN NACL 1000-0.9 UT/500ML-% IV SOLN
INTRAVENOUS | Status: DC | PRN
  Administered 2018-08-14: 500 mL

## 2018-08-14 MED ORDER — LIDOCAINE HCL (PF) 1 % IJ SOLN
INTRAMUSCULAR | Status: AC
  Filled 2018-08-14: qty 30

## 2018-08-14 MED ORDER — HEPARIN SODIUM (PORCINE) 1000 UNIT/ML IJ SOLN
INTRAMUSCULAR | Status: DC | PRN
  Administered 2018-08-14: 5000 [IU] via INTRAVENOUS

## 2018-08-14 MED ORDER — HEPARIN SODIUM (PORCINE) 1000 UNIT/ML IJ SOLN
INTRAMUSCULAR | Status: AC
  Filled 2018-08-14: qty 1

## 2018-08-14 MED ORDER — SODIUM CHLORIDE 0.9 % IV SOLN: INTRAVENOUS | Status: DC

## 2018-08-14 MED ORDER — ASPIRIN 81 MG PO CHEW: 81.0000 mg | CHEWABLE_TABLET | ORAL | Status: DC

## 2018-08-14 MED ORDER — SODIUM CHLORIDE 0.9% FLUSH
3.0000 mL | Freq: Two times a day (BID) | INTRAVENOUS | Status: DC
  Administered 2018-08-14: 3 mL via INTRAVENOUS

## 2018-08-14 MED ORDER — LIDOCAINE HCL (PF) 1 % IJ SOLN
INTRAMUSCULAR | Status: DC | PRN
  Administered 2018-08-14 (×3): 2 mL via INTRADERMAL

## 2018-08-14 MED ORDER — HEPARIN (PORCINE) IN NACL 1000-0.9 UT/500ML-% IV SOLN
INTRAVENOUS | Status: AC
  Filled 2018-08-14: qty 1000

## 2018-08-14 MED ORDER — SODIUM CHLORIDE 0.9 % IV SOLN: 250.0000 mL | INTRAVENOUS | Status: DC | PRN

## 2018-08-14 MED ORDER — SODIUM CHLORIDE 0.9 % IV SOLN
INTRAVENOUS | Status: AC | PRN
  Administered 2018-08-14: 10 mL/h via INTRAVENOUS

## 2018-08-14 MED ORDER — ENOXAPARIN SODIUM 40 MG/0.4ML ~~LOC~~ SOLN
40.0000 mg | SUBCUTANEOUS | Status: DC
  Administered 2018-08-15 – 2018-08-16 (×2): 40 mg via SUBCUTANEOUS
  Filled 2018-08-14 (×2): qty 0.4

## 2018-08-14 MED ORDER — SODIUM CHLORIDE 0.9% FLUSH
3.0000 mL | Freq: Two times a day (BID) | INTRAVENOUS | Status: DC
  Administered 2018-08-14 – 2018-08-18 (×8): 3 mL via INTRAVENOUS

## 2018-08-14 MED ORDER — VERAPAMIL HCL 2.5 MG/ML IV SOLN
INTRAVENOUS | Status: DC | PRN
  Administered 2018-08-14: 10 mL via INTRA_ARTERIAL

## 2018-08-14 SURGICAL SUPPLY — 18 items
CATH BALLN WEDGE 5F 110CM (CATHETERS) ×1 IMPLANT
CATH IMPULSE 5F ANG/FL3.5 (CATHETERS) ×1 IMPLANT
CATH LANGSTON DUAL LUM PIG 6FR (CATHETERS) ×1 IMPLANT
DEVICE RAD COMP TR BAND LRG (VASCULAR PRODUCTS) ×1 IMPLANT
GLIDESHEATH SLEND A-KIT 6F 22G (SHEATH) ×1 IMPLANT
GUIDEWIRE .025 260CM (WIRE) IMPLANT
GUIDEWIRE INQWIRE 1.5J.035X260 (WIRE) IMPLANT
INQWIRE 1.5J .035X260CM (WIRE) ×2
KIT HEART LEFT (KITS) ×2 IMPLANT
PACK CARDIAC CATHETERIZATION (CUSTOM PROCEDURE TRAY) ×2 IMPLANT
SHEATH GLIDE SLENDER 4/5FR (SHEATH) ×2 IMPLANT
SHEATH PROBE COVER 6X72 (BAG) ×1 IMPLANT
SYR MEDRAD MARK 7 150ML (SYRINGE) ×1 IMPLANT
TRANSDUCER W/STOPCOCK (MISCELLANEOUS) ×3 IMPLANT
TUBING ART PRESS 72  MALE/FEM (TUBING) ×1
TUBING ART PRESS 72 MALE/FEM (TUBING) IMPLANT
TUBING CIL FLEX 10 FLL-RA (TUBING) ×2 IMPLANT
WIRE EMERALD ST .035X150CM (WIRE) ×1 IMPLANT

## 2018-08-14 NOTE — Progress Notes (Addendum)
Progress Note  Patient Name: Daniel Valenzuela Date of Encounter: 08/14/2018  Primary Cardiologist: Lewayne Bunting, MD   Subjective   He is feeling good.  He has been up walking in the room with no shortness of breath.  His edema is improved, still 1+.  He feels like he would be able to lay flat without a problem for her cath.  Inpatient Medications    Scheduled Meds: . atorvastatin  40 mg Oral QPM  . enoxaparin (LOVENOX) injection  90 mg Subcutaneous Q24H  . escitalopram  10 mg Oral QPM  . famotidine  40 mg Oral QHS  . furosemide  80 mg Intravenous BID  . gabapentin  600 mg Oral q morning - 10a   And  . gabapentin  1,200 mg Oral QHS  . insulin aspart  0-9 Units Subcutaneous TID WC  . insulin aspart  10 Units Subcutaneous TID WC  . insulin NPH Human  40 Units Subcutaneous BID AC & HS  . multivitamin with minerals  1 tablet Oral q morning - 10a  . pantoprazole  40 mg Oral q morning - 10a  . potassium chloride  40 mEq Oral BID   Continuous Infusions:  PRN Meds: acetaminophen **OR** acetaminophen, HYDROcodone-acetaminophen   Vital Signs    Vitals:   08/13/18 0402 08/13/18 1441 08/13/18 2141 08/14/18 0434  BP: 110/72 114/76 110/71 106/73  Pulse: 82 97 98 83  Resp: Temp: 98 F (36.7 C) 98 F (36.7 C) 97.8 F (36.6 C) 98 F (36.7 C)  TempSrc: Oral Oral Oral Oral  SpO2: 92% 95% 97% 96%  Weight: (!) 183 kg   (!) 180.4 kg  Height:        Intake/Output Summary (Last 24 hours) at 08/14/2018 0748 Last data filed at 08/14/2018 0300 Gross per 24 hour  Intake 960 ml  Output 4275 ml  Net -3315 ml   Last 3 Weights 08/14/2018 08/13/2018 08/12/2018  Weight (lbs) 397 lb 11.2 oz 403 lb 8 oz 408 lb 14.4 oz  Weight (kg) 180.396 kg 183.026 kg 185.476 kg      Telemetry    Accelerated junctional rhythm 80s-90s- Personally Reviewed  ECG    New tracings- Personally Reviewed  Physical Exam   GEN:  Obese male, no acute distress.   Neck: No JVD Cardiac: RRR, distant  heart sounds, 3/6 harsh, high-pitched systolic murmur at RUSB, radiating to the carotids Respiratory: Clear to auscultation bilaterally. GI: Soft, nontender, non-distended  MS:  1+ lower leg edema to midcalf; No deformity. Neuro:  Nonfocal  Psych: Normal affect   Labs    Chemistry Recent Labs  Lab 08/10/18 1356  08/12/18 0504 08/13/18 0627 08/14/18 0439  NA 140   < > 143 143 145  K 3.3*   < > 4.1 3.7 4.6  CL 100   < > 100 103 102  CO2 30   < > 33* 30 34*  GLUCOSE 97   < > 104* 94 107*  BUN 10   < > CREATININE 0.78   < > 0.86 0.80 0.79  CALCIUM 8.5*   < > 8.8* 8.6* 9.0  PROT 6.3*  --   --   --   --   ALBUMIN 3.0*  --   --   --   --   AST 24  --   --   --   --   ALT 19  --   --   --   --  ALKPHOS 87  --   --   --   --   BILITOT 1.0  --   --   --   --   GFRNONAA >60   < > >60 >60 >60  GFRAA >60   < > >60 >60 >60  ANIONGAP 10   < > 10 10 9    < > = values in this interval not displayed.     Hematology Recent Labs  Lab 08/12/18 0504 08/13/18 0627 08/14/18 0439  WBC 8.3 7.1 8.3  RBC 4.59 4.40 4.53  HGB 12.5* 11.6* 12.5*  HCT 40.0 38.0* 39.3  MCV 87.1 86.4 86.8  MCH 27.2 26.4 27.6  MCHC 31.3 30.5 31.8  RDW 15.5 15.4 15.5  PLT 260 225 263    Cardiac EnzymesNo results for input(s): TROPONINI in the last 168 hours.  Recent Labs  Lab 08/10/18 1410  TROPIPOC 0.01     BNP Recent Labs  Lab 08/10/18 1356 08/11/18 0458  BNP 290.8* 361.3*     DDimer No results for input(s): DDIMER in the last 168 hours.   Radiology    No results found.  Cardiac Studies   Echocardiogram 08/13/2018 Study Conclusions - Left ventricle: The cavity size was normal. Systolic function was   moderately reduced. The estimated ejection fraction was in the   range of 35% to 40%. Diffuse hypokinesis. - Aortic valve: Valve mobility was restricted. There was moderate   to severe stenosis. Valve area (VTI): 1.34 cm^2. Valve area   (Vmax): 1.49 cm^2. Valve area (Vmean): 1.33  cm^2. - Mitral valve: Calcified annulus. - Pericardium, extracardiac: A trivial pericardial effusion was   identified.  Impressions: - Limited study to assess AS; technically difficult; probable   moderate LV dysfunction; calcified aortic valve with moderate to   severe AS (mean gradient 28 mmHg; AVA by continuity equation 1.34   cm2; however dimensionless index of .23 suggestive of severe AS).   Patient Profile     68 y.o. male with history of HTN, HLD, DM, obesity, aortic stenosis and sleep apnea admitted with acute CHF. Pt had 30# wt gain over the last month, dyspnea on min exertion and lower leg edema/abdominal fullness. Echo shows significantly decreased LV function with EF 35-40% down from EF 60-65% in 08/2017  Assessment & Plan    Acute combined systolic and diastolic heart failure -Diuresing with Lasix 80 mg IV twice daily, diuresis has picked up and he is clinically improving -Patient had 4.2 L urine output yesterday and is net -8 L fluid balance since admission.  Weight is down 6 pounds from yesterday, 11 pounds overall. -Renal function is stable -Patient will need right and left heart catheterization prior to discharge to further evaluate his aortic valve and exclude CAD given reduced LV systolic function.  Possibly today.  Breathing is better and patient feels like he can lay flat for cath. -The patient understands that risks with cath included but are not limited to stroke (1 in 1000), death (1 in 1000), kidney failure [usually temporary] (1 in 500), bleeding (1 in 200), allergic reaction [possibly serious] (1 in 200).   Aortic stenosis -Severe by echo.  Plan for left and right heart cath to further assess.     For questions or updates, please contact [facility] Please consult [email or web address] for contact info under     Signed, Berton Bon, NP  08/14/2018, 7:48 AM    I have personally seen and examined this patient. I agree with the assessment and  plan as  outlined above. He is doing well this am. Breathing is much improved. He is down 11 pounds and 8 liters admission. He likely has severe aortic stenosis. Echo on 08/12/18 with inconsistent AV data. Repeat limited echo yesterday with LVEF=35-40%. The AVA is 1.34 cm2 but he dimensionless index is 0.23. Mean gradient is 28 mmHg. In setting of reduced LVEF this may represent low flow/low gradient AS. Will plan right and left heart cath today to assess filling pressures, evaluate aortic stenosis and exclude CAD given recent fall in LVEF. He agrees to proceed with the cath. Risks and benefits of cath reviewed with pt. If there is no CAD, we can proceed with gated cardiac CTA and CTA chest/abd/pelvis to begin workup for AVR. He will be considered for surgical AVR vs TAVR. I would anticipate discharge home this weekend but this will be based on his volume status and cath findings.   Verne CarrowChristopher McAlhany 08/14/2018 8:31 AM

## 2018-08-14 NOTE — Interval H&P Note (Signed)
History and Physical Interval Note:  08/14/2018 9:37 AM  Daniel Valenzuela  has presented today for cardiac catheterization, with the diagnosis of aortic stenosis and HFpEF. The various methods of treatment have been discussed with the patient and family. After consideration of risks, benefits and other options for treatment, the patient has consented to  Procedure(s): RIGHT/LEFT HEART CATH AND CORONARY ANGIOGRAPHY (N/A) as a surgical intervention .  The patient's history has been reviewed, patient examined, no change in status, stable for surgery.  I have reviewed the patient's chart and labs.  Questions were answered to the patient's satisfaction.    Cath Lab Visit (complete for each Cath Lab visit)  Clinical Evaluation Leading to the Procedure:   ACS: No.  Non-ACS:    Heart Failure Classification: NYHA class IV  Anti-ischemic medical therapy: Minimal Therapy (1 class of medications)  Non-Invasive Test Results: No non-invasive testing performed; LVEF ~40% by echo with moderate to severe AS.  Prior CABG: No previous CABG  Daniel Valenzuela

## 2018-08-14 NOTE — Progress Notes (Signed)
PROGRESS NOTE  Daniel GandyDavid W Valenzuela  ZOX:[id number] DOB: 02/15/1951 DOA: 08/10/2018 PCP: Carylon PerchesFagan, Roy, MD   Brief Narrative: Daniel Valenzuela is a 68 y.o. male with a history of morbid obesity, T2DM, OSA not on CPAP, chronic diastolic CHF, HTN, HLD, and GERD who presented to the ED with 4 weeks of increasing shortness of breath, orthopnea, PND in the setting of ~30lbs weight gain slowly over the past 4-6 weeks initially in legs, but now also in abdomen. He had been evaluated previously for syncope, told to drink plenty of fluid and thinks he may now have been drinking too much fluid. Weights in records show 382lbs in March 2019 and was 408lbs on admission. BNP was elevated and he was grossly overloaded on exam, so was started on lasix and admitted. Cardiology consulted 1/14 and diuresis augmented.  2D echo showing drop in his EF from 60% to 35%.  Subjective:  denies any chest pain, cough, fever or chills, reports his breathing has improved  Assessment & Plan: Principal Problem:   Shortness of breath Active Problems:   Diabetic neuropathy (HCC)   Obesity   Chronic back pain   Sleep apnea   Type 2 diabetes mellitus without complication (HCC)   CHF (congestive heart failure) (HCC)   Acute on chronic diastolic CHF (congestive heart failure) (HCC)   Nonrheumatic aortic valve stenosis  Acute on chronic combined diastolic/systolic CHF . -Significantly volume overloaded, he reports 30 pound weight gain over the last month despite increasing his Lasix as an outpatient, apparently he is not been following low-salt diet during the holidays, most likely contributed to his volume overload . -Patient is diuresing well with current dose of IV Lasix, is -8 L since admission, renal function remained stable . -Repeat 2D echo showing drop in EF to 35%, cardiology input greatly appreciated, plan for right/left cardiac cath today. - blood Pressure on the lower side, no room to add BB or entresto.  OSA:  -Patient  reports history of sleep apnea, used to be on CPAP, but he stopped using it after his weight dropped 90 pounds.  Aortic stenosis -Management per cardiology  IDT2DM with peripheral neuropathy: Last Hb1c 9.4% - Continue insulin, NPH 40u BID and 10u + sensitive SSI qAC -A1c is 9 - Continue gabapentin  Morbid obesity:  - Dietitian consulted  HLD: - Continue statin  GERD:  - Continue PPI  Depression:  - Continue SSRI  Chronic back pain:  - Continue home medication  DVT prophylaxis: Weight-dosed lovenox Code Status: Full Family Communication: None at bedside Disposition Plan: Home once euvolemic, likely several days.  Consultants:   Cardiology  Procedures:   Echocardiogram   Antimicrobials:  None     Objective: Vitals:   08/13/18 1441 08/13/18 2141 08/14/18 0434 08/14/18 0943  BP: 114/76 110/71 106/73   Pulse: 97 98 83   Resp:  20 20   Temp: 98 F (36.7 C) 97.8 F (36.6 C) 98 F (36.7 C)   TempSrc: Oral Oral Oral   SpO2: 95% 97% 96% 98%  Weight:   (!) 180.4 kg   Height:        Intake/Output Summary (Last 24 hours) at 08/14/2018 1022 Last data filed at 08/14/2018 0300 Gross per 24 hour  Intake 600 ml  Output 3325 ml  Net -2725 ml   Filed Weights   08/12/18 0438 08/13/18 0402 08/14/18 0434  Weight: (!) 185.5 kg (!) 183 kg (!) 180.4 kg    Physical exam:  Awake Alert, Oriented  X 3, No new F.N deficits, Normal affect Symmetrical Chest wall movement, Good air movement bilaterally, CTAB RRR,No Gallops,Rubs , +sys  Murmurs, No Parasternal Heave +ve B.Sounds, Abd Soft, No tenderness, No rebound - guarding or rigidity. No Cyanosis, Clubbing , +2  edema, No new Rash or bruise    Data Reviewed: I have personally reviewed following labs and imaging studies  CBC: Recent Labs  Lab 08/10/18 1356 08/11/18 0458 08/12/18 0504 08/13/18 0627 08/14/18 0439  WBC 7.3 7.3 8.3 7.1 8.3  NEUTROABS 4.9  --   --   --   --   HGB 12.5* 11.9* 12.5* 11.6* 12.5*  HCT  40.5 38.3* 40.0 38.0* 39.3  MCV 87.7 85.9 87.1 86.4 86.8  PLT 246 234 260 225 263   Basic Metabolic Panel: Recent Labs  Lab 08/10/18 1356 08/11/18 0458 08/12/18 0504 08/13/18 0627 08/14/18 0439  NA 140 142 143 143 145  K 3.3* 4.0 4.1 3.7 4.6  CL 100 102 100 103 102  CO2 30 32 33* 30 34*  GLUCOSE 97 195* 104* 94 107*  BUN 10 12 11 10 9   CREATININE 0.78 0.95 0.86 0.80 0.79  CALCIUM 8.5* 8.4* 8.8* 8.6* 9.0   GFR: Estimated Creatinine Clearance: 154.6 mL/min (by C-G formula based on SCr of 0.79 mg/dL). Liver Function Tests: Recent Labs  Lab 08/10/18 1356  AST 24  ALT 19  ALKPHOS 87  BILITOT 1.0  PROT 6.3*  ALBUMIN 3.0*   No results for input(s): LIPASE, AMYLASE in the last 168 hours. No results for input(s): AMMONIA in the last 168 hours. Coagulation Profile: No results for input(s): INR, PROTIME in the last 168 hours. Cardiac Enzymes: No results for input(s): CKTOTAL, CKMB, CKMBINDEX, TROPONINI in the last 168 hours. BNP (last 3 results) No results for input(s): PROBNP in the last 8760 hours. HbA1C: Recent Labs    08/12/18 0504  HGBA1C 9.0*   CBG: Recent Labs  Lab 08/13/18 1216 08/13/18 1621 08/13/18 2201 08/14/18 0043 08/14/18 0726  GLUCAP 182* 180* 105* 92 93   Lipid Profile: No results for input(s): CHOL, HDL, LDLCALC, TRIG, CHOLHDL, LDLDIRECT in the last 72 hours. Thyroid Function Tests: No results for input(s): TSH, T4TOTAL, FREET4, T3FREE, THYROIDAB in the last 72 hours. Anemia Panel: No results for input(s): VITAMINB12, FOLATE, FERRITIN, TIBC, IRON, RETICCTPCT in the last 72 hours. Urine analysis: No results found for: COLORURINE, APPEARANCEUR, LABSPEC, PHURINE, GLUCOSEU, HGBUR, BILIRUBINUR, KETONESUR, PROTEINUR, UROBILINOGEN, NITRITE, LEUKOCYTESUR No results found for this or any previous visit (from the past 240 hour(s)).    Radiology Studies: No results found.  Scheduled Meds: . aspirin  81 mg Oral Pre-Cath  . [MAR Hold] atorvastatin  40  mg Oral QPM  . [MAR Hold] enoxaparin (LOVENOX) injection  90 mg Subcutaneous Q24H  . [MAR Hold] escitalopram  10 mg Oral QPM  . [MAR Hold] famotidine  40 mg Oral QHS  . [MAR Hold] furosemide  80 mg Intravenous BID  . [MAR Hold] gabapentin  600 mg Oral q morning - 10a   And  . [MAR Hold] gabapentin  1,200 mg Oral QHS  . [MAR Hold] insulin aspart  0-9 Units Subcutaneous TID WC  . [MAR Hold] insulin aspart  10 Units Subcutaneous TID WC  . [MAR Hold] insulin NPH Human  40 Units Subcutaneous BID AC & HS  . [MAR Hold] multivitamin with minerals  1 tablet Oral q morning - 10a  . [MAR Hold] pantoprazole  40 mg Oral q morning - 10a  . [  MAR Hold] potassium chloride  40 mEq Oral BID  . sodium chloride flush  3 mL Intravenous Q12H   Continuous Infusions: . sodium chloride    . sodium chloride    . sodium chloride 10 mL/hr (08/14/18 0936)     LOS: 4 days    Huey Bienenstock, MD [facility] [email or web address] Password [facility] 08/14/2018, 10:22 AM

## 2018-08-14 NOTE — Plan of Care (Signed)
  Problem: Education: Goal: Knowledge of General Education information will improve Description Including pain rating scale, medication(s)/side effects and non-pharmacologic comfort measures Outcome: Progressing Goal: Knowledge of General Education information will improve Description Including pain rating scale, medication(s)/side effects and non-pharmacologic comfort measures Outcome: Progressing   Problem: Health Behavior/Discharge Planning: Goal: Ability to manage health-related needs will improve Outcome: Progressing   Problem: Clinical Measurements: Goal: Ability to maintain clinical measurements within normal limits will improve Outcome: Progressing Goal: Will remain free from infection Outcome: Progressing Goal: Diagnostic test results will improve Outcome: Progressing Goal: Respiratory complications will improve Outcome: Progressing Goal: Cardiovascular complication will be avoided Outcome: Progressing   Problem: Activity: Goal: Risk for activity intolerance will decrease Outcome: Progressing   Problem: Nutrition: Goal: Adequate nutrition will be maintained Outcome: Progressing   Problem: Coping: Goal: Level of anxiety will decrease Outcome: Progressing   Problem: Elimination: Goal: Will not experience complications related to bowel motility Outcome: Progressing Goal: Will not experience complications related to urinary retention Outcome: Progressing   Problem: Pain Managment: Goal: General experience of comfort will improve Outcome: Progressing   Problem: Safety: Goal: Ability to remain free from injury will improve Outcome: Progressing   Problem: Skin Integrity: Goal: Risk for impaired skin integrity will decrease Outcome: Progressing   Problem: Education: Goal: Ability to demonstrate management of disease process will improve Outcome: Progressing Goal: Ability to verbalize understanding of medication therapies will improve Outcome:  Progressing Goal: Individualized Educational Video(s) Outcome: Progressing   Problem: Activity: Goal: Capacity to carry out activities will improve Outcome: Progressing   Problem: Cardiac: Goal: Ability to achieve and maintain adequate cardiopulmonary perfusion will improve Outcome: Progressing

## 2018-08-14 NOTE — Brief Op Note (Signed)
BRIEF CARDIAC CATHETERIZATION NOTE  08/14/2018  10:42 AM  PATIENT:  Daniel Valenzuela  68 y.o. male  PRE-OPERATIVE DIAGNOSIS:  Aortic stenosis and acute on chronic HFpEF  POST-OPERATIVE DIAGNOSIS:  Same  PROCEDURE:  Procedure(s): RIGHT/LEFT HEART CATH AND CORONARY ANGIOGRAPHY (N/A)  SURGEON:  Surgeon(s) and Role:    * Soniya Ashraf, Cristal Deer, MD - Primary  FINDINGS: 1. Non-obstructive CAD. 2. Moderately elevated left/right heart filling pressures. 3. Moderate aortic stenosis. 4. Normal Fick CO/CI.  RECOMMENDATIONS: 1. Continue diuresis. 2. Further w/u of AS per Dr. Clifton James. 3. Medical therapy to prevent progression of CAD.  Yvonne Kendall, MD [facility] Pager: [phone]

## 2018-08-14 NOTE — H&P (View-Only) (Signed)
 Progress Note  Patient Name: Daniel Valenzuela Date of Encounter: 08/14/2018  Primary Cardiologist: [name], MD   Subjective   He is feeling good.  He has been up walking in the room with no shortness of breath.  His edema is improved, still 1+.  He feels like he would be able to lay flat without a problem for her cath.  Inpatient Medications    Scheduled Meds: . atorvastatin  40 mg Oral QPM  . enoxaparin (LOVENOX) injection  90 mg Subcutaneous Q24H  . escitalopram  10 mg Oral QPM  . famotidine  40 mg Oral QHS  . furosemide  80 mg Intravenous BID  . gabapentin  600 mg Oral q morning - 10a   And  . gabapentin  1,200 mg Oral QHS  . insulin aspart  0-9 Units Subcutaneous TID WC  . insulin aspart  10 Units Subcutaneous TID WC  . insulin NPH Human  40 Units Subcutaneous BID AC & HS  . multivitamin with minerals  1 tablet Oral q morning - 10a  . pantoprazole  40 mg Oral q morning - 10a  . potassium chloride  40 mEq Oral BID   Continuous Infusions:  PRN Meds: acetaminophen **OR** acetaminophen, HYDROcodone-acetaminophen   Vital Signs    Vitals:   08/13/18 0402 08/13/18 1441 08/13/18 2141 08/14/18 0434  BP: 110/72 114/76 110/71 106/73  Pulse: 82 97 98 83  Resp: 20  20 20  Temp: 98 F (36.7 C) 98 F (36.7 C) 97.8 F (36.6 C) 98 F (36.7 C)  TempSrc: Oral Oral Oral Oral  SpO2: 92% 95% 97% 96%  Weight: (!) 183 kg   (!) 180.4 kg  Height:        Intake/Output Summary (Last 24 hours) at 08/14/2018 0748 Last data filed at 08/14/2018 0300 Gross per 24 hour  Intake 960 ml  Output 4275 ml  Net -3315 ml   Last 3 Weights 08/14/2018 08/13/2018 08/12/2018  Weight (lbs) 397 lb 11.2 oz 403 lb 8 oz 408 lb 14.4 oz  Weight (kg) 180.396 kg 183.026 kg 185.476 kg      Telemetry    Accelerated junctional rhythm 80s-90s- Personally Reviewed  ECG    New tracings- Personally Reviewed  Physical Exam   GEN:  Obese male, no acute distress.   Neck: No JVD Cardiac: RRR, distant  heart sounds, 3/6 harsh, high-pitched systolic murmur at RUSB, radiating to the carotids Respiratory: Clear to auscultation bilaterally. GI: Soft, nontender, non-distended  MS:  1+ lower leg edema to midcalf; No deformity. Neuro:  Nonfocal  Psych: Normal affect   Labs    Chemistry Recent Labs  Lab 08/10/18 1356  08/12/18 0504 08/13/18 0627 08/14/18 0439  NA 140   < > 143 143 145  K 3.3*   < > 4.1 3.7 4.6  CL 100   < > 100 103 102  CO2 30   < > 33* 30 34*  GLUCOSE 97   < > 104* 94 107*  BUN 10   < > 11 10 9  CREATININE 0.78   < > 0.86 0.80 0.79  CALCIUM 8.5*   < > 8.8* 8.6* 9.0  PROT 6.3*  --   --   --   --   ALBUMIN 3.0*  --   --   --   --   AST 24  --   --   --   --   ALT 19  --   --   --   --     ALKPHOS 87  --   --   --   --   BILITOT 1.0  --   --   --   --   GFRNONAA >60   < > >60 >60 >60  GFRAA >60   < > >60 >60 >60  ANIONGAP 10   < > 10 10 9    < > = values in this interval not displayed.     Hematology Recent Labs  Lab 08/12/18 0504 08/13/18 0627 08/14/18 0439  WBC 8.3 7.1 8.3  RBC 4.59 4.40 4.53  HGB 12.5* 11.6* 12.5*  HCT 40.0 38.0* 39.3  MCV 87.1 86.4 86.8  MCH 27.2 26.4 27.6  MCHC 31.3 30.5 31.8  RDW 15.5 15.4 15.5  PLT 260 225 263    Cardiac EnzymesNo results for input(s): TROPONINI in the last 168 hours.  Recent Labs  Lab 08/10/18 1410  TROPIPOC 0.01     BNP Recent Labs  Lab 08/10/18 1356 08/11/18 0458  BNP 290.8* 361.3*     DDimer No results for input(s): DDIMER in the last 168 hours.   Radiology    No results found.  Cardiac Studies   Echocardiogram 08/13/2018 Study Conclusions - Left ventricle: The cavity size was normal. Systolic function was   moderately reduced. The estimated ejection fraction was in the   range of 35% to 40%. Diffuse hypokinesis. - Aortic valve: Valve mobility was restricted. There was moderate   to severe stenosis. Valve area (VTI): 1.34 cm^2. Valve area   (Vmax): 1.49 cm^2. Valve area (Vmean): 1.33  cm^2. - Mitral valve: Calcified annulus. - Pericardium, extracardiac: A trivial pericardial effusion was   identified.  Impressions: - Limited study to assess AS; technically difficult; probable   moderate LV dysfunction; calcified aortic valve with moderate to   severe AS (mean gradient 28 mmHg; AVA by continuity equation 1.34   cm2; however dimensionless index of .23 suggestive of severe AS).   Patient Profile     68 y.o. male with history of HTN, HLD, DM, obesity, aortic stenosis and sleep apnea admitted with acute CHF. Pt had 30# wt gain over the last month, dyspnea on min exertion and lower leg edema/abdominal fullness. Echo shows significantly decreased LV function with EF 35-40% down from EF 60-65% in 08/2017  Assessment & Plan    Acute combined systolic and diastolic heart failure -Diuresing with Lasix 80 mg IV twice daily, diuresis has picked up and he is clinically improving -Patient had 4.2 L urine output yesterday and is net -8 L fluid balance since admission.  Weight is down 6 pounds from yesterday, 11 pounds overall. -Renal function is stable -Patient will need right and left heart catheterization prior to discharge to further evaluate his aortic valve and exclude CAD given reduced LV systolic function.  Possibly today.  Breathing is better and patient feels like he can lay flat for cath. -The patient understands that risks with cath included but are not limited to stroke (1 in 1000), death (1 in 1000), kidney failure [usually temporary] (1 in 500), bleeding (1 in 200), allergic reaction [possibly serious] (1 in 200).   Aortic stenosis -Severe by echo.  Plan for left and right heart cath to further assess.     For questions or updates, please contact [facility] Please consult [email or web address] for contact info under     Signed, Berton Bon, NP  08/14/2018, 7:48 AM    I have personally seen and examined this patient. I agree with the assessment and  plan as  outlined above. He is doing well this am. Breathing is much improved. He is down 11 pounds and 8 liters admission. He likely has severe aortic stenosis. Echo on 08/12/18 with inconsistent AV data. Repeat limited echo yesterday with LVEF=35-40%. The AVA is 1.34 cm2 but he dimensionless index is 0.23. Mean gradient is 28 mmHg. In setting of reduced LVEF this may represent low flow/low gradient AS. Will plan right and left heart cath today to assess filling pressures, evaluate aortic stenosis and exclude CAD given recent fall in LVEF. He agrees to proceed with the cath. Risks and benefits of cath reviewed with pt. If there is no CAD, we can proceed with gated cardiac CTA and CTA chest/abd/pelvis to begin workup for AVR. He will be considered for surgical AVR vs TAVR. I would anticipate discharge home this weekend but this will be based on his volume status and cath findings.   Verne CarrowChristopher Roza Creamer 08/14/2018 8:31 AM

## 2018-08-15 LAB — CBC
HCT: 39.3 % (ref 39.0–52.0)
HEMOGLOBIN: 12.1 g/dL — AB (ref 13.0–17.0)
MCH: 26.4 pg (ref 26.0–34.0)
MCHC: 30.8 g/dL (ref 30.0–36.0)
MCV: 85.8 fL (ref 80.0–100.0)
Platelets: 248 10*3/uL (ref 150–400)
RBC: 4.58 MIL/uL (ref 4.22–5.81)
RDW: 15.5 % (ref 11.5–15.5)
WBC: 7.1 10*3/uL (ref 4.0–10.5)
nRBC: 0 % (ref 0.0–0.2)

## 2018-08-15 LAB — BASIC METABOLIC PANEL
Anion gap: 9 (ref 5–15)
BUN: 8 mg/dL (ref 8–23)
CO2: 32 mmol/L (ref 22–32)
CREATININE: 0.75 mg/dL (ref 0.61–1.24)
Calcium: 8.8 mg/dL — ABNORMAL LOW (ref 8.9–10.3)
Chloride: 101 mmol/L (ref 98–111)
GFR calc Af Amer: >60 mL/min (ref >60)
GFR calc non Af Amer: >60 mL/min (ref >60)
Glucose, Bld: 122 mg/dL — ABNORMAL HIGH (ref 70–99)
POTASSIUM: 4 mmol/L (ref 3.5–5.1)
Sodium: 142 mmol/L (ref 135–145)

## 2018-08-15 LAB — GLUCOSE, CAPILLARY
Glucose-Capillary: 109 mg/dL — ABNORMAL HIGH (ref 70–99)
Glucose-Capillary: 146 mg/dL — ABNORMAL HIGH (ref 70–99)
Glucose-Capillary: 55 mg/dL — ABNORMAL LOW (ref 70–99)
Glucose-Capillary: 67 mg/dL — ABNORMAL LOW (ref 70–99)
Glucose-Capillary: 100 mg/dL — ABNORMAL HIGH (ref 70–99)
Glucose-Capillary: 138 mg/dL — ABNORMAL HIGH (ref 70–99)

## 2018-08-15 MED ORDER — INSULIN NPH (HUMAN) (ISOPHANE) 100 UNIT/ML ~~LOC~~ SUSP
20.0000 [IU] | Freq: Two times a day (BID) | SUBCUTANEOUS | Status: DC
  Administered 2018-08-15 – 2018-08-16 (×2): 20 [IU] via SUBCUTANEOUS
  Filled 2018-08-15: qty 10

## 2018-08-15 NOTE — Progress Notes (Signed)
PROGRESS NOTE  Daniel GandyDavid W Heiberger  UJW:[id number] DOB: 12/10/1950 DOA: 08/10/2018 PCP: Carylon PerchesFagan, Roy, MD   Brief Narrative:  Daniel Valenzuela is a 68 y.o. male with a history of morbid obesity, T2DM, OSA not on CPAP, chronic diastolic CHF, HTN, HLD, and GERD who presented to the ED with 4 weeks of increasing shortness of breath, orthopnea, PND in the setting of ~30lbs weight gain slowly over the past 4-6 weeks initially in legs, but now also in abdomen. He had been evaluated previously for syncope, told to drink plenty of fluid and thinks he may now have been drinking too much fluid. Weights in records show 382lbs in March 2019 and was 408lbs on admission. BNP was elevated and he was grossly overloaded on exam, so was started on lasix and admitted. Cardiology consulted 1/14 and diuresis augmented.  2D echo showing drop in his EF from 60% to 35%.  I went for right/left heart cath.  Subjective:  denies any chest pain, cough, fever or chills, reports his breathing has improved  Assessment & Plan: Principal Problem:   Shortness of breath Active Problems:   Diabetic neuropathy (HCC)   Obesity   Chronic back pain   Sleep apnea   Type 2 diabetes mellitus without complication (HCC)   CHF (congestive heart failure) (HCC)   Acute on chronic diastolic CHF (congestive heart failure) (HCC)   Aortic valve stenosis  Acute on chronic combined diastolic/systolic CHF . -Significantly volume overloaded, he reports 30 pound weight gain over the last month despite increasing his Lasix as an outpatient, apparently he is not been following low-salt diet during the holidays, most likely contributed to his volume overload . -Diuresis per cardiology, he has been diuresing well, so far -10.6 L since admission, getting up 4 pounds over the last 24 hours, but despite that remains significantly volume overloaded, was +2-3 lower extremity edema, continue with current dose IV diuresis. -2D echo drop in EF from 60 to 35%, left  cardiac cath thickened for nonobstructive CAD, recommendation for demise medical management, management per cardiology.  OSA:  -Patient reports history of sleep apnea, used to be on CPAP, but he stopped using it after his weight dropped 90 pounds.  Aortic stenosis -Management per cardiology, moderate per cardiac cath  IDT2DM with peripheral neuropathy: Last Hb1c 9.4% - Continue insulin, NPH 40u BID and 10u + sensitive SSI qAC -A1c is 9 - Continue gabapentin  Morbid obesity:  - Dietitian consulted  HLD: - Continue statin  GERD:  - Continue PPI  Depression:  - Continue SSRI  Chronic back pain:  - Continue home medication  DVT prophylaxis: Weight-dosed lovenox Code Status: Full Family Communication: None at bedside Disposition Plan: Home once euvolemic, likely several days.  Consultants:   Cardiology  Procedures:   Echocardiogram   Right/left cardiac cath 08/14/2018  Antimicrobials:  None     Objective: Vitals:   08/14/18 1933 08/15/18 0528 08/15/18 0537 08/15/18 0906  BP: 115/75 103/79 117/68 119/85  Pulse: 98 90 85 98  Resp: 18 18 18    Temp: 98 F (36.7 C) 98.3 F (36.8 C) 98.4 F (36.9 C)   TempSrc: Oral Oral Oral   SpO2: 100% 94% 96%   Weight: (!) 178.3 kg     Height:        Intake/Output Summary (Last 24 hours) at 08/15/2018 1021 Last data filed at 08/15/2018 0814 Gross per 24 hour  Intake 763 ml  Output 3425 ml  Net -2662 ml   [employer]Filed Weights  08/13/18 0402 08/14/18 0434 08/14/18 1933  Weight: (!) 183 kg (!) 180.4 kg (!) 178.3 kg    Physical exam:  Awake Alert, Oriented X 3, No new F.N deficits, Normal affect Symmetrical Chest wall movement, diminished at the bases, CTAB RRR,No Gallops,Rubs , + sys  Murmurs, No Parasternal Heave +ve B.Sounds, Abd Soft, No tenderness, No rebound - guarding or rigidity. No Cyanosis, Clubbing, +2 edema, No new Rash or bruise     Data Reviewed: I have personally reviewed following labs and imaging  studies  CBC: Recent Labs  Lab 08/10/18 1356 08/11/18 0458 08/12/18 0504 08/13/18 0627 08/14/18 0439 08/15/18 0628  WBC 7.3 7.3 8.3 7.1 8.3 7.1  NEUTROABS 4.9  --   --   --   --   --   HGB 12.5* 11.9* 12.5* 11.6* 12.5* 12.1*  HCT 40.5 38.3* 40.0 38.0* 39.3 39.3  MCV 87.7 85.9 87.1 86.4 86.8 85.8  PLT 246 234 260 225 263 248   Basic Metabolic Panel: Recent Labs  Lab 08/11/18 0458 08/12/18 0504 08/13/18 0627 08/14/18 0439 08/15/18 0628  NA 142 143 143 145 142  K 4.0 4.1 3.7 4.6 4.0  CL 102 100 103 102 101  CO2 32 33* 30 34* 32  GLUCOSE 195* 104* 94 107* 122*  BUN 12 11 10 9 8   CREATININE 0.95 0.86 0.80 0.79 0.75  CALCIUM 8.4* 8.8* 8.6* 9.0 8.8*   GFR: Estimated Creatinine Clearance: 153.5 mL/min (by C-G formula based on SCr of 0.75 mg/dL). Liver Function Tests: Recent Labs  Lab 08/10/18 1356  AST 24  ALT 19  ALKPHOS 87  BILITOT 1.0  PROT 6.3*  ALBUMIN 3.0*   No results for input(s): LIPASE, AMYLASE in the last 168 hours. No results for input(s): AMMONIA in the last 168 hours. Coagulation Profile: No results for input(s): INR, PROTIME in the last 168 hours. Cardiac Enzymes: No results for input(s): CKTOTAL, CKMB, CKMBINDEX, TROPONINI in the last 168 hours. BNP (last 3 results) No results for input(s): PROBNP in the last 8760 hours. HbA1C: No results for input(s): HGBA1C in the last 72 hours. CBG: Recent Labs  Lab 08/14/18 0726 08/14/18 1137 08/14/18 1636 08/14/18 2117 08/15/18 0812  GLUCAP 93 110* 193* 226* 109*   Lipid Profile: No results for input(s): CHOL, HDL, LDLCALC, TRIG, CHOLHDL, LDLDIRECT in the last 72 hours. Thyroid Function Tests: No results for input(s): TSH, T4TOTAL, FREET4, T3FREE, THYROIDAB in the last 72 hours. Anemia Panel: No results for input(s): VITAMINB12, FOLATE, FERRITIN, TIBC, IRON, RETICCTPCT in the last 72 hours. Urine analysis: No results found for: COLORURINE, APPEARANCEUR, LABSPEC, PHURINE, GLUCOSEU, HGBUR,  BILIRUBINUR, KETONESUR, PROTEINUR, UROBILINOGEN, NITRITE, LEUKOCYTESUR No results found for this or any previous visit (from the past 240 hour(s)).    Radiology Studies: No results found.  Scheduled Meds: . atorvastatin  40 mg Oral QPM  . enoxaparin (LOVENOX) injection  40 mg Subcutaneous Q24H  . escitalopram  10 mg Oral QPM  . famotidine  40 mg Oral QHS  . furosemide  80 mg Intravenous BID  . gabapentin  600 mg Oral q morning - 10a   And  . gabapentin  1,200 mg Oral QHS  . insulin aspart  0-9 Units Subcutaneous TID WC  . insulin aspart  10 Units Subcutaneous TID WC  . insulin NPH Human  40 Units Subcutaneous BID AC & HS  . multivitamin with minerals  1 tablet Oral q morning - 10a  . pantoprazole  40 mg Oral q morning -  10a  . potassium chloride  40 mEq Oral BID  . sodium chloride flush  3 mL Intravenous Q12H   Continuous Infusions: . sodium chloride       LOS: 5 days    Huey Bienenstock, MD [facility] [email or web address] Password [facility] 08/15/2018, 10:21 AM

## 2018-08-15 NOTE — Progress Notes (Signed)
Progress Note  Patient Name: Daniel Valenzuela Date of Encounter: 08/15/2018  Primary Cardiologist: Lewayne Bunting, MD   Subjective    Inpatient Medications    Scheduled Meds: . atorvastatin  40 mg Oral QPM  . enoxaparin (LOVENOX) injection  40 mg Subcutaneous Q24H  . escitalopram  10 mg Oral QPM  . famotidine  40 mg Oral QHS  . furosemide  80 mg Intravenous BID  . gabapentin  600 mg Oral q morning - 10a   And  . gabapentin  1,200 mg Oral QHS  . insulin aspart  0-9 Units Subcutaneous TID WC  . insulin aspart  10 Units Subcutaneous TID WC  . insulin NPH Human  40 Units Subcutaneous BID AC & HS  . multivitamin with minerals  1 tablet Oral q morning - 10a  . pantoprazole  40 mg Oral q morning - 10a  . potassium chloride  40 mEq Oral BID  . sodium chloride flush  3 mL Intravenous Q12H   Continuous Infusions: . sodium chloride     PRN Meds: sodium chloride, acetaminophen **OR** acetaminophen, HYDROcodone-acetaminophen, sodium chloride flush   Vital Signs    Vitals:   08/14/18 1933 08/15/18 0528 08/15/18 0537 08/15/18 0906  BP: 115/75 103/79 117/68 119/85  Pulse: 98 90 85 98  Resp: 18 18 18    Temp: 98 F (36.7 C) 98.3 F (36.8 C) 98.4 F (36.9 C)   TempSrc: Oral Oral Oral   SpO2: 100% 94% 96%   Weight: (!) 178.3 kg     Height:        Intake/Output Summary (Last 24 hours) at 08/15/2018 1020 Last data filed at 08/15/2018 0814 Gross per 24 hour  Intake 763 ml  Output 3425 ml  Net -2662 ml   I/O  Net 10.7 L   Last 3 Weights 08/14/2018 08/14/2018 08/13/2018  Weight (lbs) 393 lb 1.6 oz 397 lb 11.2 oz 403 lb 8 oz  Weight (kg) 178.309 kg 180.396 kg 183.026 kg      Telemetry    SR  - Personally Reviewed  ECG    Physical Exam   GEN:  Obese male, no acute distress.   Neck: No JVD Cardiac: RRR, distant heart sounds, 3/6 harsh, high-pitched systolic murmur at RUSB, radiating to the carotids Respiratory: Clear to auscultation bilaterally. GI: Soft, nontender,  non-distended  MS:  1+ lower leg edema to midcalf; No deformity. Neuro:  Nonfocal  Psych: Normal affect   Labs    Chemistry Recent Labs  Lab 08/10/18 1356  08/13/18 0627 08/14/18 0439 08/15/18 0628  NA 140   < > 143 145 142  K 3.3*   < > 3.7 4.6 4.0  CL 100   < > 103 102 101  CO2 30   < > 30 34* 32  GLUCOSE 97   < > 94 107* 122*  BUN 10   < > 10 9 8   CREATININE 0.78   < > 0.80 0.79 0.75  CALCIUM 8.5*   < > 8.6* 9.0 8.8*  PROT 6.3*  --   --   --   --   ALBUMIN 3.0*  --   --   --   --   AST 24  --   --   --   --   ALT 19  --   --   --   --   ALKPHOS 87  --   --   --   --   BILITOT 1.0  --   --   --   --  GFRNONAA >60   < > >60 >60 >60  GFRAA >60   < > >60 >60 >60  ANIONGAP 10   < > 10 9 9    < > = values in this interval not displayed.     Hematology Recent Labs  Lab 08/13/18 0627 08/14/18 0439 08/15/18 0628  WBC 7.1 8.3 7.1  RBC 4.40 4.53 4.58  HGB 11.6* 12.5* 12.1*  HCT 38.0* 39.3 39.3  MCV 86.4 86.8 85.8  MCH 26.4 27.6 26.4  MCHC 30.5 31.8 30.8  RDW 15.4 15.5 15.5  PLT 225 263 248    Cardiac EnzymesNo results for input(s): TROPONINI in the last 168 hours.  Recent Labs  Lab 08/10/18 1410  TROPIPOC 0.01     BNP Recent Labs  Lab 08/10/18 1356 08/11/18 0458  BNP 290.8* 361.3*     DDimer No results for input(s): DDIMER in the last 168 hours.   Radiology    No results found.  Cardiac Studies   Echocardiogram 08/13/2018 Study Conclusions - Left ventricle: The cavity size was normal. Systolic function was   moderately reduced. The estimated ejection fraction was in the   range of 35% to 40%. Diffuse hypokinesis. - Aortic valve: Valve mobility was restricted. There was moderate   to severe stenosis. Valve area (VTI): 1.34 cm^2. Valve area   (Vmax): 1.49 cm^2. Valve area (Vmean): 1.33 cm^2. - Mitral valve: Calcified annulus. - Pericardium, extracardiac: A trivial pericardial effusion was   identified.  Impressions: - Limited study to  assess AS; technically difficult; probable   moderate LV dysfunction; calcified aortic valve with moderate to   severe AS (mean gradient 28 mmHg; AVA by continuity equation 1.34   cm2; however dimensionless index of .23 suggestive of severe AS).   Cardiac Cath (R and L)  LM normal; LAD 25%; LCx normal   RCA minimal irregularities   R heart cath Right Heart Pressures RA (mean): 15 mmHg with prominent V-waves RV (S/EDP): 46/15 mmHg PA (S/D, mean): 44/30 (35) mmHg PCWP (mean): 25 mmHg  Ao sat: 96% PA sat: 65%  Fick CO: 7.2 L/min Fick CI: 2.5 L/min/m^2     Patient Profile     68 y.o. male with history of HTN, HLD, DM, obesity, aortic stenosis and sleep apnea admitted with acute CHF. Pt had 30# wt gain over the last month, dyspnea on min exertion and lower leg edema/abdominal fullness. Echo shows significantly decreased LV function with EF 35-40% down from EF 60-65% in 08/2017  Assessment & Plan    Acute combined systolic and diastolic heart failure - Pt still with increased volume on exam   COntinue diuresis today  AS   Cath showed probable moderate AS    CAD   Mild   By cath   HL  COnitnue statin   For questions or updates, please contact [facility] Please consult [email or web address] for contact info under     Signed, Dietrich Pates, MD  08/15/2018, 10:20 AM   Dietrich Pates 08/15/2018 10:20 AM

## 2018-08-16 LAB — BASIC METABOLIC PANEL
ANION GAP: 9 (ref 5–15)
BUN: 8 mg/dL (ref 8–23)
CO2: 32 mmol/L (ref 22–32)
Calcium: 8.8 mg/dL — ABNORMAL LOW (ref 8.9–10.3)
Chloride: 102 mmol/L (ref 98–111)
Creatinine, Ser: 0.81 mg/dL (ref 0.61–1.24)
GFR calc Af Amer: >60 mL/min (ref >60)
GFR calc non Af Amer: >60 mL/min (ref >60)
Glucose, Bld: 95 mg/dL (ref 70–99)
Potassium: 3.9 mmol/L (ref 3.5–5.1)
Sodium: 143 mmol/L (ref 135–145)

## 2018-08-16 LAB — GLUCOSE, CAPILLARY
Glucose-Capillary: 90 mg/dL (ref 70–99)
Glucose-Capillary: 175 mg/dL — ABNORMAL HIGH (ref 70–99)
Glucose-Capillary: 156 mg/dL — ABNORMAL HIGH (ref 70–99)
Glucose-Capillary: 88 mg/dL (ref 70–99)

## 2018-08-16 LAB — LIPID PANEL
CHOL/HDL RATIO: 2.2 ratio
Cholesterol: 78 mg/dL (ref 0–200)
HDL: 35 mg/dL — AB (ref >40)
LDL Cholesterol: 32 mg/dL (ref 0–99)
Triglycerides: 53 mg/dL (ref <150)
VLDL: 11 mg/dL (ref 0–40)

## 2018-08-16 MED ORDER — METOLAZONE 5 MG PO TABS
5.0000 mg | ORAL_TABLET | Freq: Once | ORAL | Status: AC
  Administered 2018-08-16: 5 mg via ORAL
  Filled 2018-08-16: qty 1

## 2018-08-16 MED ORDER — ENOXAPARIN SODIUM 40 MG/0.4ML ~~LOC~~ SOLN
40.0000 mg | SUBCUTANEOUS | Status: DC
  Administered 2018-08-17 – 2018-08-18 (×2): 40 mg via SUBCUTANEOUS
  Filled 2018-08-16 (×2): qty 0.4

## 2018-08-16 NOTE — Progress Notes (Signed)
PROGRESS NOTE  Daniel Valenzuela  LZJ:[id number] DOB: May 08, 1951 DOA: 08/10/2018 PCP: Carylon Perches, MD   Brief Narrative:  Daniel Valenzuela is a 68 y.o. male with a history of morbid obesity, T2DM, OSA not on CPAP, chronic diastolic CHF, HTN, HLD, and GERD who presented to the ED with 4 weeks of increasing shortness of breath, orthopnea, PND in the setting of ~30lbs weight gain slowly over the past 4-6 weeks initially in legs, but now also in abdomen. He had been evaluated previously for syncope, told to drink plenty of fluid and thinks he may now have been drinking too much fluid. Weights in records show 382lbs in March 2019 and was 408lbs on admission. BNP was elevated and he was grossly overloaded on exam, so was started on lasix and admitted. Cardiology consulted 1/14 and diuresis augmented.  2D echo showing drop in his EF from 60% to 35%.  He  went for right/left heart cath.  Subjective:  denies any chest pain, cough, fever or chills, dyspnea continues to improve  Assessment & Plan: Principal Problem:   Shortness of breath Active Problems:   Diabetic neuropathy (HCC)   Obesity   Chronic back pain   Sleep apnea   Type 2 diabetes mellitus without complication (HCC)   CHF (congestive heart failure) (HCC)   Acute on chronic diastolic CHF (congestive heart failure) (HCC)   Aortic valve stenosis  Acute on chronic combined diastolic/systolic CHF . -Significantly volume overloaded, he reports 30 pound weight gain over the last month despite increasing his Lasix as an outpatient, apparently he is not been following low-salt diet during the holidays, most likely contributed to his volume overload . -Diuresis per cardiology, patient on Lasix 80 mg IV twice Valenzuela, he is diuresing well, total 11.8 L negative since admission, 1.7 L negative over the last 24 hours , will get extra dose of metolazone today to augment his diuresis , especially he started having significant lower extremity edema, and stable  renal function, continue with Valenzuela weights,  -2D echo drop in EF from 60 to 35%, left cardiac cath thickened for nonobstructive CAD, recommendation for demise medical management, management per cardiology.  OSA:  -Patient reports history of sleep apnea, used to be on CPAP, but he stopped using it after his weight dropped 90 pounds.  Aortic stenosis -Management per cardiology, moderate per cardiac cath  IDT2DM with peripheral neuropathy: Last Hb1c 9.4% -Patient with 1 episode of hypoglycemia yesterday, patient apparently he is trying to eat healthy now, I have decreased his insulin from 40 to 20 units twice Valenzuela, CBG appears to be acceptable currently, continue with insulin sliding scale . -A1c is 9 - Continue gabapentin  Morbid obesity:  - Dietitian consulted  HLD: - Continue statin  GERD:  - Continue PPI  Depression:  - Continue SSRI  Chronic back pain:  - Continue home medication  DVT prophylaxis: Weight-dosed lovenox Code Status: Full Family Communication: None at bedside Disposition Plan: Home once euvolemic, likely several days.  Consultants:   Cardiology  Procedures:   Echocardiogram   Right/left cardiac cath 08/14/2018  Antimicrobials:  None     Objective: Vitals:   08/15/18 1205 08/15/18 1928 08/16/18 0440 08/16/18 0440  BP: (!) 112/59 113/81  103/69  Pulse: 96 100  93  Resp: 20 18  18   Temp: 98.1 F (36.7 C) 97.7 F (36.5 C)  98.7 F (37.1 C)  TempSrc: Oral Oral  Oral  SpO2: 91% 92%  94%  Weight:   Marland Kitchen)  176 kg (!) 176 kg  Height:        Intake/Output Summary (Last 24 hours) at 08/16/2018 1211 Last data filed at 08/16/2018 1054 Gross per 24 hour  Intake 1200 ml  Output 1650 ml  Net -450 ml   Filed Weights   08/14/18 1933 08/16/18 0440 08/16/18 0440  Weight: (!) 178.3 kg (!) 176 kg (!) 176 kg    Physical exam:  Awake Alert, Oriented X 3, No new F.N deficits, Normal affect Symmetrical Chest wall movement, Good air movement  bilaterally, CTAB RRR,No Gallops,Rubs or new Murmurs, No Parasternal Heave +ve B.Sounds, Abd Soft, No tenderness, No rebound - guarding or rigidity. No Cyanosis, Clubbing , +2 edema, No new Rash or bruise      Data Reviewed: I have personally reviewed following labs and imaging studies  CBC: Recent Labs  Lab 08/10/18 1356 08/11/18 0458 08/12/18 0504 08/13/18 0627 08/14/18 0439 08/15/18 0628  WBC 7.3 7.3 8.3 7.1 8.3 7.1  NEUTROABS 4.9  --   --   --   --   --   HGB 12.5* 11.9* 12.5* 11.6* 12.5* 12.1*  HCT 40.5 38.3* 40.0 38.0* 39.3 39.3  MCV 87.7 85.9 87.1 86.4 86.8 85.8  PLT 246 234 260 225 263 248   Basic Metabolic Panel: Recent Labs  Lab 08/12/18 0504 08/13/18 0627 08/14/18 0439 08/15/18 0628 08/16/18 0431  NA 143 143 145 142 143  K 4.1 3.7 4.6 4.0 3.9  CL 100 103 102 101 102  CO2 33* 30 34* 32 32  GLUCOSE 104* 94 107* 122* 95  BUN 11 10 9 8 8   CREATININE 0.86 0.80 0.79 0.75 0.81  CALCIUM 8.8* 8.6* 9.0 8.8* 8.8*   GFR: Estimated Creatinine Clearance: 150.5 mL/min (by C-G formula based on SCr of 0.81 mg/dL). Liver Function Tests: Recent Labs  Lab 08/10/18 1356  AST 24  ALT 19  ALKPHOS 87  BILITOT 1.0  PROT 6.3*  ALBUMIN 3.0*   No results for input(s): LIPASE, AMYLASE in the last 168 hours. No results for input(s): AMMONIA in the last 168 hours. Coagulation Profile: No results for input(s): INR, PROTIME in the last 168 hours. Cardiac Enzymes: No results for input(s): CKTOTAL, CKMB, CKMBINDEX, TROPONINI in the last 168 hours. BNP (last 3 results) No results for input(s): PROBNP in the last 8760 hours. HbA1C: No results for input(s): HGBA1C in the last 72 hours. CBG: Recent Labs  Lab 08/15/18 1633 08/15/18 1651 08/15/18 1809 08/15/18 2137 08/16/18 0754  GLUCAP 55* 67* 100* 138* 90   Lipid Profile: Recent Labs    08/16/18 0431  CHOL 78  HDL 35*  LDLCALC 32  TRIG 53  CHOLHDL 2.2   Thyroid Function Tests: No results for input(s): TSH,  T4TOTAL, FREET4, T3FREE, THYROIDAB in the last 72 hours. Anemia Panel: No results for input(s): VITAMINB12, FOLATE, FERRITIN, TIBC, IRON, RETICCTPCT in the last 72 hours. Urine analysis: No results found for: COLORURINE, APPEARANCEUR, LABSPEC, PHURINE, GLUCOSEU, HGBUR, BILIRUBINUR, KETONESUR, PROTEINUR, UROBILINOGEN, NITRITE, LEUKOCYTESUR No results found for this or any previous visit (from the past 240 hour(s)).    Radiology Studies: No results found.  Scheduled Meds: . atorvastatin  40 mg Oral QPM  . enoxaparin (LOVENOX) injection  40 mg Subcutaneous Q24H  . escitalopram  10 mg Oral QPM  . famotidine  40 mg Oral QHS  . furosemide  80 mg Intravenous BID  . gabapentin  600 mg Oral q morning - 10a   And  . gabapentin  1,200  mg Oral QHS  . insulin aspart  0-9 Units Subcutaneous TID WC  . insulin aspart  10 Units Subcutaneous TID WC  . insulin NPH Human  20 Units Subcutaneous BID AC & HS  . multivitamin with minerals  1 tablet Oral q morning - 10a  . pantoprazole  40 mg Oral q morning - 10a  . potassium chloride  40 mEq Oral BID  . sodium chloride flush  3 mL Intravenous Q12H   Continuous Infusions: . sodium chloride       LOS: 6 days    Huey Bienenstock, MD [facility] [email or web address] Password [facility] 08/16/2018, 12:11 PM

## 2018-08-16 NOTE — Progress Notes (Signed)
Progress Note  Patient Name: Daniel GandyDavid W Valenzuela Date of Encounter: 08/16/2018  Primary Cardiologist: Lewayne BuntingGregg Taylor, MD   Subjective   Breathing is fair   No CP    Inpatient Medications    Scheduled Meds: . atorvastatin  40 mg Oral QPM  . enoxaparin (LOVENOX) injection  40 mg Subcutaneous Q24H  . escitalopram  10 mg Oral QPM  . famotidine  40 mg Oral QHS  . furosemide  80 mg Intravenous BID  . gabapentin  600 mg Oral q morning - 10a   And  . gabapentin  1,200 mg Oral QHS  . insulin aspart  0-9 Units Subcutaneous TID WC  . insulin aspart  10 Units Subcutaneous TID WC  . insulin NPH Human  20 Units Subcutaneous BID AC & HS  . multivitamin with minerals  1 tablet Oral q morning - 10a  . pantoprazole  40 mg Oral q morning - 10a  . potassium chloride  40 mEq Oral BID  . sodium chloride flush  3 mL Intravenous Q12H   Continuous Infusions: . sodium chloride     PRN Meds: sodium chloride, acetaminophen **OR** acetaminophen, HYDROcodone-acetaminophen, sodium chloride flush   Vital Signs    Vitals:   08/15/18 1205 08/15/18 1928 08/16/18 0440 08/16/18 0440  BP: (!) 112/59 113/81  103/69  Pulse: 96 100  93  Resp: 20 18  18   Temp: 98.1 F (36.7 C) 97.7 F (36.5 C)  98.7 F (37.1 C)  TempSrc: Oral Oral  Oral  SpO2: 91% 92%  94%  Weight:   (!) 176 kg (!) 176 kg  Height:        Intake/Output Summary (Last 24 hours) at 08/16/2018 1012 Last data filed at 08/16/2018 0450 Gross per 24 hour  Intake 1200 ml  Output 2400 ml  Net -1200 ml   I/O  Net 11.9 L   Last 3 Weights 08/16/2018 08/16/2018 08/14/2018  Weight (lbs) 388 lb 388 lb 393 lb 1.6 oz  Weight (kg) 175.996 kg 175.996 kg 178.309 kg      Telemetry    SR  - Personally Reviewed  ECG    Physical Exam   GEN:  Obese male, no acute distress.   Neck: No JVD Cardiac: RRR, distant heart sounds, 3/6 harsh, systolic murmur sternal border  Respiratory: Clear to auscultation bilaterally. GI: Soft, nontender, non-distended    MS:  1+ lower leg edema to midcalf; No deformity. Neuro:  Nonfocal  Psych: Normal affect   Labs    Chemistry Recent Labs  Lab 08/10/18 1356  08/14/18 0439 08/15/18 0628 08/16/18 0431  NA 140   < > 145 142 143  K 3.3*   < > 4.6 4.0 3.9  CL 100   < > 102 101 102  CO2 30   < > 34* 32 32  GLUCOSE 97   < > 107* 122* 95  BUN 10   < > 9 8 8   CREATININE 0.78   < > 0.79 0.75 0.81  CALCIUM 8.5*   < > 9.0 8.8* 8.8*  PROT 6.3*  --   --   --   --   ALBUMIN 3.0*  --   --   --   --   AST 24  --   --   --   --   ALT 19  --   --   --   --   ALKPHOS 87  --   --   --   --  BILITOT 1.0  --   --   --   --   GFRNONAA >60   < > >60 >60 >60  GFRAA >60   < > >60 >60 >60  ANIONGAP 10   < > 9 9 9    < > = values in this interval not displayed.     Hematology Recent Labs  Lab 08/13/18 0627 08/14/18 0439 08/15/18 0628  WBC 7.1 8.3 7.1  RBC 4.40 4.53 4.58  HGB 11.6* 12.5* 12.1*  HCT 38.0* 39.3 39.3  MCV 86.4 86.8 85.8  MCH 26.4 27.6 26.4  MCHC 30.5 31.8 30.8  RDW 15.4 15.5 15.5  PLT 225 263 248    Cardiac EnzymesNo results for input(s): TROPONINI in the last 168 hours.  Recent Labs  Lab 08/10/18 1410  TROPIPOC 0.01     BNP Recent Labs  Lab 08/10/18 1356 08/11/18 0458  BNP 290.8* 361.3*     DDimer No results for input(s): DDIMER in the last 168 hours.   Radiology    No results found.  Cardiac Studies   Echocardiogram 08/13/2018 Study Conclusions - Left ventricle: The cavity size was normal. Systolic function was   moderately reduced. The estimated ejection fraction was in the   range of 35% to 40%. Diffuse hypokinesis. - Aortic valve: Valve mobility was restricted. There was moderate   to severe stenosis. Valve area (VTI): 1.34 cm^2. Valve area   (Vmax): 1.49 cm^2. Valve area (Vmean): 1.33 cm^2. - Mitral valve: Calcified annulus. - Pericardium, extracardiac: A trivial pericardial effusion was   identified.  Impressions: - Limited study to assess AS;  technically difficult; probable   moderate LV dysfunction; calcified aortic valve with moderate to   severe AS (mean gradient 28 mmHg; AVA by continuity equation 1.34   cm2; however dimensionless index of .23 suggestive of severe AS).   Cardiac Cath (R and L)  LM normal; LAD 25%; LCx normal   RCA minimal irregularities   R heart cath Right Heart Pressures RA (mean): 15 mmHg with prominent V-waves RV (S/EDP): 46/15 mmHg PA (S/D, mean): 44/30 (35) mmHg PCWP (mean): 25 mmHg  Ao sat: 96% PA sat: 65%  Fick CO: 7.2 L/min Fick CI: 2.5 L/min/m^2     Patient Profile     68 y.o. male with history of HTN, HLD, DM, obesity, aortic stenosis and sleep apnea admitted with acute CHF. Pt had 30# wt gain over the last month, dyspnea on min exertion and lower leg edema/abdominal fullness. Echo shows significantly decreased LV function with EF 35-40% down from EF 60-65% in 08/2017  Assessment & Plan    Acute combined systolic and diastolic heart failure - Pt continues to diurese  Continue lasix     AS   Cath showed moderate AS  Will review findings with structural team    CAD   Mild   By cath   HL  COnitnue statin   For questions or updates, please contact [facility] Please consult [email or web address] for contact info under     Signed, Dietrich Pates, MD  08/16/2018, 10:12 AM   Dietrich Pates 08/16/2018 10:12 AM

## 2018-08-17 ENCOUNTER — Encounter (HOSPITAL_COMMUNITY): Payer: Self-pay | Admitting: Internal Medicine

## 2018-08-17 LAB — GLUCOSE, CAPILLARY
Glucose-Capillary: 182 mg/dL — ABNORMAL HIGH (ref 70–99)
Glucose-Capillary: 190 mg/dL — ABNORMAL HIGH (ref 70–99)
Glucose-Capillary: 238 mg/dL — ABNORMAL HIGH (ref 70–99)
Glucose-Capillary: 85 mg/dL (ref 70–99)
Glucose-Capillary: 97 mg/dL (ref 70–99)
Glucose-Capillary: 97 mg/dL (ref 70–99)

## 2018-08-17 LAB — BASIC METABOLIC PANEL
Anion gap: 11 (ref 5–15)
BUN: 10 mg/dL (ref 8–23)
CO2: 32 mmol/L (ref 22–32)
Calcium: 8.8 mg/dL — ABNORMAL LOW (ref 8.9–10.3)
Chloride: 97 mmol/L — ABNORMAL LOW (ref 98–111)
Creatinine, Ser: 0.93 mg/dL (ref 0.61–1.24)
GFR calc Af Amer: >60 mL/min (ref >60)
GFR calc non Af Amer: >60 mL/min (ref >60)
Glucose, Bld: 121 mg/dL — ABNORMAL HIGH (ref 70–99)
POTASSIUM: 3.7 mmol/L (ref 3.5–5.1)
Sodium: 140 mmol/L (ref 135–145)

## 2018-08-17 MED ORDER — INSULIN NPH (HUMAN) (ISOPHANE) 100 UNIT/ML ~~LOC~~ SUSP
10.0000 [IU] | Freq: Two times a day (BID) | SUBCUTANEOUS | Status: DC
  Administered 2018-08-17 – 2018-08-18 (×3): 10 [IU] via SUBCUTANEOUS
  Filled 2018-08-17: qty 10

## 2018-08-17 MED ORDER — POTASSIUM CHLORIDE CRYS ER 20 MEQ PO TBCR
20.0000 meq | EXTENDED_RELEASE_TABLET | Freq: Once | ORAL | Status: AC
  Administered 2018-08-17: 20 meq via ORAL
  Filled 2018-08-17: qty 1

## 2018-08-17 MED ORDER — METOLAZONE 5 MG PO TABS
5.0000 mg | ORAL_TABLET | Freq: Once | ORAL | Status: AC
  Administered 2018-08-17: 5 mg via ORAL
  Filled 2018-08-17: qty 1

## 2018-08-17 MED ORDER — POTASSIUM CHLORIDE CRYS ER 20 MEQ PO TBCR: 20.0000 meq | EXTENDED_RELEASE_TABLET | Freq: Once | ORAL | Status: DC

## 2018-08-17 NOTE — Care Management Important Message (Signed)
Important Message  Patient Details  Name: Daniel Valenzuela MRN: [id number] Date of Birth: April 29, 1951   Medicare Important Message Given:  Yes    Oralia Rud Nayeliz Hipp 08/17/2018, 2:27 PM

## 2018-08-17 NOTE — Progress Notes (Addendum)
Progress Note  Patient Name: Daniel Valenzuela Date of Encounter: 08/17/2018  Primary Cardiologist: Lewayne Bunting, MD   Subjective   NO CP  Breathing is OK at rest    Inpatient Medications    Scheduled Meds: . atorvastatin  40 mg Oral QPM  . enoxaparin (LOVENOX) injection  40 mg Subcutaneous Q24H  . escitalopram  10 mg Oral QPM  . famotidine  40 mg Oral QHS  . furosemide  80 mg Intravenous BID  . gabapentin  600 mg Oral q morning - 10a   And  . gabapentin  1,200 mg Oral QHS  . insulin aspart  0-9 Units Subcutaneous TID WC  . insulin NPH Human  10 Units Subcutaneous BID AC & HS  . multivitamin with minerals  1 tablet Oral q morning - 10a  . pantoprazole  40 mg Oral q morning - 10a  . potassium chloride  40 mEq Oral BID  . sodium chloride flush  3 mL Intravenous Q12H   Continuous Infusions: . sodium chloride     PRN Meds: sodium chloride, acetaminophen **OR** acetaminophen, HYDROcodone-acetaminophen, sodium chloride flush   Vital Signs    Vitals:   08/16/18 0440 08/16/18 1314 08/16/18 1920 08/17/18 0400  BP: 103/69 113/83 117/72 109/72  Pulse: 93 97 94 100  Resp: 18 20 20 20   Temp: 98.7 F (37.1 C) 98.1 F (36.7 C) 98.1 F (36.7 C) 97.6 F (36.4 C)  TempSrc: Oral Oral Oral Oral  SpO2: 94% 93% 94% 95%  Weight: (!) 176 kg   (!) 172.4 kg  Height:        Intake/Output Summary (Last 24 hours) at 08/17/2018 0812 Last data filed at 08/17/2018 0400 Gross per 24 hour  Intake 1040 ml  Output 4950 ml  Net -3910 ml   I/O  Net   16.2 L   L   Last 3 Weights 08/17/2018 08/16/2018 08/16/2018  Weight (lbs) 380 lb 1.6 oz 388 lb 388 lb  Weight (kg) 172.412 kg 175.996 kg 175.996 kg      Telemetry    SR  - Personally Reviewed  ECG    Physical Exam   GEN:  Obese male, no acute distress.   Neck: Neck is full  JVP is increased  Cardiac: RRR, distant heart sounds, 3/6 systolic murmur sternal border  Mid peaking  Respiratory: Clear to auscultation bilaterally. GI: Soft,  nontender, non-distended  MS:  1+ lower leg edema to midcalf  Imprved from yesterday   Neuro:  Nonfocal  Psych: Normal affect   Labs    Chemistry Recent Labs  Lab 08/10/18 1356  08/15/18 0628 08/16/18 0431 08/17/18 0517  NA 140   < > 142 143 140  K 3.3*   < > 4.0 3.9 3.7  CL 100   < > 101 102 97*  CO2 30   < > 32 32 32  GLUCOSE 97   < > 122* 95 121*  BUN 10   < > 8 8 10   CREATININE 0.78   < > 0.75 0.81 0.93  CALCIUM 8.5*   < > 8.8* 8.8* 8.8*  PROT 6.3*  --   --   --   --   ALBUMIN 3.0*  --   --   --   --   AST 24  --   --   --   --   ALT 19  --   --   --   --   ALKPHOS 87  --   --   --   --  BILITOT 1.0  --   --   --   --   GFRNONAA >60   < > >60 >60 >60  GFRAA >60   < > >60 >60 >60  ANIONGAP 10   < > 9 9 11    < > = values in this interval not displayed.     Hematology Recent Labs  Lab 08/13/18 0627 08/14/18 0439 08/15/18 0628  WBC 7.1 8.3 7.1  RBC 4.40 4.53 4.58  HGB 11.6* 12.5* 12.1*  HCT 38.0* 39.3 39.3  MCV 86.4 86.8 85.8  MCH 26.4 27.6 26.4  MCHC 30.5 31.8 30.8  RDW 15.4 15.5 15.5  PLT 225 263 248    Cardiac EnzymesNo results for input(s): TROPONINI in the last 168 hours.  Recent Labs  Lab 08/10/18 1410  TROPIPOC 0.01     BNP Recent Labs  Lab 08/10/18 1356 08/11/18 0458  BNP 290.8* 361.3*     DDimer No results for input(s): DDIMER in the last 168 hours.   Radiology    No results found.  Cardiac Studies   Echocardiogram 08/13/2018 Study Conclusions - Left ventricle: The cavity size was normal. Systolic function was   moderately reduced. The estimated ejection fraction was in the   range of 35% to 40%. Diffuse hypokinesis. - Aortic valve: Valve mobility was restricted. There was moderate   to severe stenosis. Valve area (VTI): 1.34 cm^2. Valve area   (Vmax): 1.49 cm^2. Valve area (Vmean): 1.33 cm^2. - Mitral valve: Calcified annulus. - Pericardium, extracardiac: A trivial pericardial effusion was   identified.  Impressions: -  Limited study to assess AS; technically difficult; probable   moderate LV dysfunction; calcified aortic valve with moderate to   severe AS (mean gradient 28 mmHg; AVA by continuity equation 1.34   cm2; however dimensionless index of .23 suggestive of severe AS).   Cardiac Cath (R and L)  LM normal; LAD 25%; LCx normal   RCA minimal irregularities   R heart cath Right Heart Pressures RA (mean): 15 mmHg with prominent V-waves RV (S/EDP): 46/15 mmHg PA (S/D, mean): 44/30 (35) mmHg PCWP (mean): 25 mmHg  Ao sat: 96% PA sat: 65%  Fick CO: 7.2 L/min Fick CI: 2.5 L/min/m^2     Patient Profile     68 y.o. male with history of HTN, HLD, DM, obesity, aortic stenosis and sleep apnea admitted with acute CHF. Pt had 30# wt gain over the last month, dyspnea on min exertion and lower leg edema/abdominal fullness. Echo shows significantly decreased LV function with EF 35-40% down from EF 60-65% in 08/2017  Assessment & Plan    Acute combined systolic and diastolic heart failure - Pt continues to diurese on IV lasix    I would continue   He still has increased volume on exam and the more that can be done IV the better   Luckily renal function is normal    AS   Cath showed moderate AS   Will review with structural heart team    CAD   Mild   By cath   HL  COnitnue statin  LDL is 32   HDL 35    REviewed with wife and pt low NA diet (2 to 3 g NA)    She admits they were eating the wrong things     For questions or updates, please contact [facility] Please consult [email or web address] for contact info under     Signed, Dietrich PatesPaula Cornel Werber, MD  08/17/2018, 8:12 AM   Gunnar FusiPaula  Miquel Lamson 08/17/2018 8:12 AM

## 2018-08-17 NOTE — Progress Notes (Signed)
PROGRESS NOTE  [name]  FIE:[id number] DOB: 04/20/1951 DOA: 08/10/2018 PCP: Carylon Perches, MD   Brief Narrative:  Daniel Valenzuela is a 68 y.o. male with a history of morbid obesity, T2DM, OSA not on CPAP, chronic diastolic CHF, HTN, HLD, and GERD who presented to the ED with 4 weeks of increasing shortness of breath, orthopnea, PND in the setting of ~30lbs weight gain slowly over the past 4-6 weeks initially in legs, but now also in abdomen. He had been evaluated previously for syncope, told to drink plenty of fluid and thinks he may now have been drinking too much fluid. Weights in records show 382lbs in March 2019 and was 408lbs on admission. BNP was elevated and he was grossly overloaded on exam, so was started on lasix and admitted. Cardiology consulted 1/14 and diuresis augmented.  2D echo showing drop in his EF from 60% to 35%.  He  went for right/left heart cath.  Subjective:  denies any chest pain, cough, fever or chills, dyspnea continues to improve  Assessment & Plan: Principal Problem:   Shortness of breath Active Problems:   Diabetic neuropathy (HCC)   Obesity   Chronic back pain   Sleep apnea   Type 2 diabetes mellitus without complication (HCC)   CHF (congestive heart failure) (HCC)   Acute on chronic diastolic CHF (congestive heart failure) (HCC)   Aortic valve stenosis  Acute on chronic combined diastolic/systolic CHF . -Significantly volume overloaded, he reports 30 pound weight gain over the last month despite increasing his Lasix as an outpatient, apparently he is not been following low-salt diet during the holidays, most likely contributed to his volume overload . -Patient massively volume overload on admission, significantly improved with diuresis, on Lasix IV 80 mg twice daily, received 5 mg of metolazone yesterday which improved his diuresis to 3.9 L, will give another 5 mg of metolazone today, so far he is -15.7 L since admission . -2D echo drop in EF from 60 to  35%, left cardiac cath thickened for nonobstructive CAD, recommendation for optimized medical management, management per cardiology, LDL is 32.  OSA:  -Patient reports history of sleep apnea, used to be on CPAP, but he stopped using it after his weight dropped 90 pounds.  Aortic stenosis -Management per cardiology, moderate per cardiac cath  IDT2DM with peripheral neuropathy: Last Hb1c 9.4% -Requiring 4 units of NPH was daily at home, during hospital stay he is more compliant with diet, he is currently on 10 units twice daily , continue with insulin sliding scale as well during hospital stay . -A1c is 9 - Continue gabapentin  Morbid obesity:  - Dietitian consulted  HLD: - Continue statin  GERD:  - Continue PPI  Depression:  - Continue SSRI  Chronic back pain:  - Continue home medication  DVT prophylaxis: Weight-dosed lovenox Code Status: Full Family Communication: wife at bedside Disposition Plan: Home once euvolemic, likely several days.  Consultants:   Cardiology  Procedures:   Echocardiogram   Right/left cardiac cath 08/14/2018  Antimicrobials:  None     Objective: Vitals:   08/16/18 0440 08/16/18 1314 08/16/18 1920 08/17/18 0400  BP: 103/69 113/83 117/72 109/72  Pulse: 93 97 94 100  Resp: 18 20 20 20   Temp: 98.7 F (37.1 C) 98.1 F (36.7 C) 98.1 F (36.7 C) 97.6 F (36.4 C)  TempSrc: Oral Oral Oral Oral  SpO2: 94% 93% 94% 95%  Weight: (!) 176 kg   (!) 172.4 kg  Height:  Intake/Output Summary (Last 24 hours) at 08/17/2018 1226 Last data filed at 08/17/2018 0956 Gross per 24 hour  Intake 1040 ml  Output 6050 ml  Net -5010 ml   Filed Weights   08/16/18 0440 08/16/18 0440 08/17/18 0400  Weight: (!) 176 kg (!) 176 kg (!) 172.4 kg    Physical exam:  Awake Alert, Oriented X 3, No new F.N deficits, Normal affect Symmetrical Chest wall movement, Good air movement bilaterally, CTAB RRR,No Gallops,Rubs or new Murmurs, No Parasternal  Heave +ve B.Sounds, Abd Soft, No tenderness, No rebound - guarding or rigidity. No Cyanosis, Clubbing , +2 edema, No new Rash or bruise      Data Reviewed: I have personally reviewed following labs and imaging studies  CBC: Recent Labs  Lab 08/10/18 1356 08/11/18 0458 08/12/18 0504 08/13/18 0627 08/14/18 0439 08/15/18 0628  WBC 7.3 7.3 8.3 7.1 8.3 7.1  NEUTROABS 4.9  --   --   --   --   --   HGB 12.5* 11.9* 12.5* 11.6* 12.5* 12.1*  HCT 40.5 38.3* 40.0 38.0* 39.3 39.3  MCV 87.7 85.9 87.1 86.4 86.8 85.8  PLT 246 234 260 225 263 248   Basic Metabolic Panel: Recent Labs  Lab 08/13/18 0627 08/14/18 0439 08/15/18 0628 08/16/18 0431 08/17/18 0517  NA 143 145 142 143 140  K 3.7 4.6 4.0 3.9 3.7  CL 103 102 101 102 97*  CO2 30 34* 32 32 32  GLUCOSE 94 107* 122* 95 121*  BUN 10 9 8 8 10   CREATININE 0.80 0.79 0.75 0.81 0.93  CALCIUM 8.6* 9.0 8.8* 8.8* 8.8*   GFR: Estimated Creatinine Clearance: 129.4 mL/min (by C-G formula based on SCr of 0.93 mg/dL). Liver Function Tests: Recent Labs  Lab 08/10/18 1356  AST 24  ALT 19  ALKPHOS 87  BILITOT 1.0  PROT 6.3*  ALBUMIN 3.0*   No results for input(s): LIPASE, AMYLASE in the last 168 hours. No results for input(s): AMMONIA in the last 168 hours. Coagulation Profile: No results for input(s): INR, PROTIME in the last 168 hours. Cardiac Enzymes: No results for input(s): CKTOTAL, CKMB, CKMBINDEX, TROPONINI in the last 168 hours. BNP (last 3 results) No results for input(s): PROBNP in the last 8760 hours. HbA1C: No results for input(s): HGBA1C in the last 72 hours. CBG: Recent Labs  Lab 08/16/18 1717 08/16/18 2108 08/17/18 0010 08/17/18 0403 08/17/18 0729  GLUCAP 156* 88 85 97 97   Lipid Profile: Recent Labs    08/16/18 0431  CHOL 78  HDL 35*  LDLCALC 32  TRIG 53  CHOLHDL 2.2   Thyroid Function Tests: No results for input(s): TSH, T4TOTAL, FREET4, T3FREE, THYROIDAB in the last 72 hours. Anemia Panel: No  results for input(s): VITAMINB12, FOLATE, FERRITIN, TIBC, IRON, RETICCTPCT in the last 72 hours. Urine analysis: No results found for: COLORURINE, APPEARANCEUR, LABSPEC, PHURINE, GLUCOSEU, HGBUR, BILIRUBINUR, KETONESUR, PROTEINUR, UROBILINOGEN, NITRITE, LEUKOCYTESUR No results found for this or any previous visit (from the past 240 hour(s)).    Radiology Studies: No results found.  Scheduled Meds: . atorvastatin  40 mg Oral QPM  . enoxaparin (LOVENOX) injection  40 mg Subcutaneous Q24H  . escitalopram  10 mg Oral QPM  . famotidine  40 mg Oral QHS  . furosemide  80 mg Intravenous BID  . gabapentin  600 mg Oral q morning - 10a   And  . gabapentin  1,200 mg Oral QHS  . insulin aspart  0-9 Units Subcutaneous TID WC  .  insulin NPH Human  10 Units Subcutaneous BID AC & HS  . metolazone  5 mg Oral Once  . multivitamin with minerals  1 tablet Oral q morning - 10a  . pantoprazole  40 mg Oral q morning - 10a  . potassium chloride  20 mEq Oral Once  . potassium chloride  40 mEq Oral BID  . sodium chloride flush  3 mL Intravenous Q12H   Continuous Infusions: . sodium chloride       LOS: 7 days    Huey Bienenstock, MD [facility] [email or web address] Password [facility] 08/17/2018, 12:26 PM

## 2018-08-18 ENCOUNTER — Other Ambulatory Visit: Payer: Self-pay | Admitting: Student

## 2018-08-18 DIAGNOSIS — I5033 Acute on chronic diastolic (congestive) heart failure: Secondary | ICD-10-CM

## 2018-08-18 LAB — GLUCOSE, CAPILLARY
Glucose-Capillary: 211 mg/dL — ABNORMAL HIGH (ref 70–99)
Glucose-Capillary: 188 mg/dL — ABNORMAL HIGH (ref 70–99)
Glucose-Capillary: 261 mg/dL — ABNORMAL HIGH (ref 70–99)

## 2018-08-18 MED ORDER — FUROSEMIDE 40 MG PO TABS: 60.0000 mg | ORAL_TABLET | Freq: Every day | ORAL | Status: DC

## 2018-08-18 MED ORDER — METOPROLOL SUCCINATE ER 25 MG PO TB24: 25.0000 mg | ORAL_TABLET | Freq: Every day | ORAL | 0 refills | Status: DC

## 2018-08-18 MED ORDER — FUROSEMIDE 40 MG PO TABS: 60.0000 mg | ORAL_TABLET | Freq: Every day | ORAL | 0 refills | Status: DC

## 2018-08-18 MED ORDER — INSULIN NPH (HUMAN) (ISOPHANE) 100 UNIT/ML ~~LOC~~ SUSP: 10.0000 [IU] | Freq: Two times a day (BID) | SUBCUTANEOUS | 11 refills | Status: DC

## 2018-08-18 MED ORDER — POTASSIUM CHLORIDE CRYS ER 20 MEQ PO TBCR: 20.0000 meq | EXTENDED_RELEASE_TABLET | Freq: Every day | ORAL | Status: DC

## 2018-08-18 MED ORDER — FUROSEMIDE 40 MG PO TABS
40.0000 mg | ORAL_TABLET | Freq: Every day | ORAL | Status: DC
  Administered 2018-08-18: 40 mg via ORAL
  Filled 2018-08-18: qty 1

## 2018-08-18 MED ORDER — METOPROLOL SUCCINATE ER 25 MG PO TB24
25.0000 mg | ORAL_TABLET | Freq: Every day | ORAL | Status: DC
  Administered 2018-08-18: 25 mg via ORAL
  Filled 2018-08-18: qty 1

## 2018-08-18 MED FILL — FUROSEMIDE 40 MG TABLET: 40 | 30 days supply | Qty: 45 | Fill #0

## 2018-08-18 MED FILL — METOPROLOL SUCCINATE ER 25: 25 | 30 days supply | Qty: 30 | Fill #0

## 2018-08-18 NOTE — Progress Notes (Signed)
Ordered repeat BMET to monitor renal function and electrolytes given new Lasix dose.

## 2018-08-18 NOTE — Plan of Care (Signed)

## 2018-08-18 NOTE — Progress Notes (Addendum)
Progress Note  Patient Name: Daniel Valenzuela Date of Encounter: 08/18/2018  Primary Cardiologist: Lewayne Bunting, MD   Subjective   Pt denies CP   Breahting is OK even laying flat  Inpatient Medications    Scheduled Meds: . atorvastatin  40 mg Oral QPM  . enoxaparin (LOVENOX) injection  40 mg Subcutaneous Q24H  . escitalopram  10 mg Oral QPM  . famotidine  40 mg Oral QHS  . furosemide  80 mg Intravenous BID  . gabapentin  600 mg Oral q morning - 10a   And  . gabapentin  1,200 mg Oral QHS  . insulin aspart  0-9 Units Subcutaneous TID WC  . insulin NPH Human  10 Units Subcutaneous BID AC & HS  . multivitamin with minerals  1 tablet Oral q morning - 10a  . pantoprazole  40 mg Oral q morning - 10a  . potassium chloride  40 mEq Oral BID  . sodium chloride flush  3 mL Intravenous Q12H   Continuous Infusions: . sodium chloride     PRN Meds: sodium chloride, acetaminophen **OR** acetaminophen, HYDROcodone-acetaminophen, sodium chloride flush   Vital Signs    Vitals:   08/16/18 1920 08/17/18 0400 08/17/18 2132 08/18/18 0557  BP: 117/72 109/72 108/77 99/70  Pulse: 94 100 100 94  Resp: 20 20 20 18   Temp: 98.1 F (36.7 C) 97.6 F (36.4 C) 97.8 F (36.6 C) 98 F (36.7 C)  TempSrc: Oral Oral Oral Oral  SpO2: 94% 95% 94% 97%  Weight:  (!) 172.4 kg  (!) 167.3 kg  Height:        Intake/Output Summary (Last 24 hours) at 08/18/2018 0713 Last data filed at 08/18/2018 0370 Gross per 24 hour  Intake 963 ml  Output 5100 ml  Net -4137 ml   I/O  Net negative   20  L since admit    Last 3 Weights 08/18/2018 08/17/2018 08/16/2018  Weight (lbs) 368 lb 14.4 oz 380 lb 1.6 oz 388 lb  Weight (kg) 167.332 kg 172.412 kg 175.996 kg      Telemetry      - Personally Reviewed  ECG    Physical Exam   GEN:  Obese male, no acute distress.   Neck: Neck is full  JVP is mildly increased   Cardiac: RRR, distant heart sounds, 3/6 systolic murmur sternal border  Mid peaking  Respiratory:  Clear to auscultation bilaterally. GI: Soft, nontender, non-distended  MS:  Trivial lower leg edema to midcalf  Imprved from yesterday   Neuro:  Nonfocal  Psych: Normal affect   Labs    Chemistry Recent Labs  Lab 08/15/18 0628 08/16/18 0431 08/17/18 0517  NA 142 143 140  K 4.0 3.9 3.7  CL 101 102 97*  CO2 32 32 32  GLUCOSE 122* 95 121*  BUN 8 8 10   CREATININE 0.75 0.81 0.93  CALCIUM 8.8* 8.8* 8.8*  GFRNONAA >60 >60 >60  GFRAA >60 >60 >60  ANIONGAP 9 9 11      Hematology Recent Labs  Lab 08/13/18 0627 08/14/18 0439 08/15/18 0628  WBC 7.1 8.3 7.1  RBC 4.40 4.53 4.58  HGB 11.6* 12.5* 12.1*  HCT 38.0* 39.3 39.3  MCV 86.4 86.8 85.8  MCH 26.4 27.6 26.4  MCHC 30.5 31.8 30.8  RDW 15.4 15.5 15.5  PLT 225 263 248    Cardiac EnzymesNo results for input(s): TROPONINI in the last 168 hours.  No results for input(s): TROPIPOC in the last 168 hours.  BNP No results for input(s): BNP, PROBNP in the last 168 hours.   DDimer No results for input(s): DDIMER in the last 168 hours.   Radiology    No results found.  Cardiac Studies   Echocardiogram 08/13/2018 Study Conclusions - Left ventricle: The cavity size was normal. Systolic function was   moderately reduced. The estimated ejection fraction was in the   range of 35% to 40%. Diffuse hypokinesis. - Aortic valve: Valve mobility was restricted. There was moderate   to severe stenosis. Valve area (VTI): 1.34 cm^2. Valve area   (Vmax): 1.49 cm^2. Valve area (Vmean): 1.33 cm^2. - Mitral valve: Calcified annulus. - Pericardium, extracardiac: A trivial pericardial effusion was   identified.  Impressions: - Limited study to assess AS; technically difficult; probable   moderate LV dysfunction; calcified aortic valve with moderate to   severe AS (mean gradient 28 mmHg; AVA by continuity equation 1.34   cm2; however dimensionless index of .23 suggestive of severe AS).   Cardiac Cath (R and L)  LM normal; LAD 25%;  LCx normal   RCA minimal irregularities   R heart cath Right Heart Pressures RA (mean): 15 mmHg with prominent V-waves RV (S/EDP): 46/15 mmHg PA (S/D, mean): 44/30 (35) mmHg PCWP (mean): 25 mmHg  Ao sat: 96% PA sat: 65%  Fick CO: 7.2 L/min Fick CI: 2.5 L/min/m^2     Patient Profile     68 y.o. male with history of HTN, HLD, DM, obesity, aortic stenosis and sleep apnea admitted with acute CHF. Pt had 30# wt gain over the last month, dyspnea on min exertion and lower leg edema/abdominal fullness. Echo shows significantly decreased LV function with EF 35-40% down from EF 60-65% in 08/2017  Assessment & Plan    Acute combined systolic and diastolic heart failure  LVEF 35 to 405  - Pt has diuresed very well    Cr has not bumped yet     Presentation of CHF prob relfects dietary noncompliance   Wife and pt admit they were eating wrong.     AS is moderate  By cath    Would follow clinicaly  His volume may be up a little but overall I think he is OK to be discharged    Will need to come up with an outpt dose of lasix    Would give 60 daily and follow with labs and wts later this week  Would add very low dose toprol XL 25   This can be followed as outpt   AS   Cath showed moderate AS   I have reviewed case with Dr Excell Seltzer   Would reocmm following clinically as outpt with Na restriction   See how does   If does not do well would reocmm appt in that clinic    CAD   Mild   By cath   HL  COnitnue statin  LDL is 32   HDL 35     Hx syncope    Has loop implanted  Nothing found    I am not convinced due to AS   REviewed again with pt low NA diet (2 to 3 g NA)    Daily wts   Pt lives in [place]   Will try to arrange outpt f/u in our [place] office  For questions or updates, please contact [facility] Please consult [email or web address] for contact info under     Signed, Dietrich Pates, MD  08/18/2018, 7:13 AM   Dietrich Pates 08/18/2018  7:13 AM

## 2018-08-18 NOTE — Discharge Summary (Signed)
Daniel Valenzuela, is a 68 y.o. male  DOB 05-Jun-1951  MRN [id number].  Admission date:  08/10/2018  Admitting Physician  Dorcas Carrow, MD  Discharge Date:  08/18/2018   Primary MD  Carylon Perches, MD  Recommendations for primary care physician for things to follow:  -Please check CBC, BMP during next visit   Admission Diagnosis  Acute on chronic diastolic CHF (congestive heart failure) (HCC) [I50.33]   Discharge Diagnosis  Acute on chronic diastolic CHF (congestive heart failure) (HCC) [I50.33]    Principal Problem:   Shortness of breath Active Problems:   Diabetic neuropathy (HCC)   Obesity   Chronic back pain   Sleep apnea   Type 2 diabetes mellitus without complication (HCC)   CHF (congestive heart failure) (HCC)   Acute on chronic diastolic CHF (congestive heart failure) (HCC)   Aortic valve stenosis      Past Medical History:  Diagnosis Date  . Chronic back pain    "mainly lower right now" (08/12/2018)  . Depression   . Diabetic neuropathy (HCC)   . GERD (gastroesophageal reflux disease)   . Heart murmur   . High cholesterol   . Hypertension   . Obesity   . Rheumatoid arteritis (HCC)    "hands" (08/12/2018)  . Sleep apnea    "lost weight; not on CPAP anymore" (08/12/2018)  . Spinal stenosis of lumbar region    "w/slipped disc" (08/12/2018)  . Type II diabetes mellitus (HCC)     Past Surgical History:  Procedure Laterality Date  . HERNIA REPAIR    . LAPAROSCOPIC GASTRIC BANDING    . LOOP RECORDER INSERTION N/A 09/01/2017   Procedure: LOOP RECORDER INSERTION;  Surgeon: Duke Salvia, MD;  Location: [facility] INVASIVE CV LAB;  Service: Cardiovascular;  Laterality: N/A;  . RIGHT/LEFT HEART CATH AND CORONARY ANGIOGRAPHY N/A 08/14/2018   Procedure: RIGHT/LEFT HEART CATH AND CORONARY ANGIOGRAPHY;  Surgeon: Yvonne Kendall, MD;  Location: [facility] INVASIVE CV LAB;  Service: Cardiovascular;  Laterality:  N/A;  . SHOULDER ARTHROSCOPY Right   . TONSILLECTOMY    . UMBILICAL HERNIA REPAIR         History of present illness and  Hospital Course:     Kindly see H&P for history of present illness and admission details, please review complete Labs, Consult reports and Test reports for all details in brief  HPI  from the history and physical done on the day of admission 08/10/2018  HPI: Daniel Valenzuela is a 68 y.o. male with medical history significant of type 2 diabetes, morbid obesity, sleep apnea untreated, GERD, history of diastolic dysfunction who presents to the hospital with progressive shortness of breath and weight gain of 33 pounds in 1 month.  According to the patient, he was doing well with weight loss and intermittent fasting, however since last 1 month he has been gaining weight.  Recorded 33 pounds since about 5 weeks.  He also has progressive swelling of the legs.  His abdomen is distended more than usual.  He  also having exacerbating back pain because of distended abdomen.  Patient gets short of breath on walking to the bathroom about the room away.  Sleeps in a incline her, no PND.  His last echocardiogram was with normal ejection fraction.  Patient does have history of right bundle branch block, he does have a history of syncope and has implantable loop recorder.  He had various events but no arrhythmias.  Patient did have some bradycardia and his beta-blockers were discontinued.  No more syncopal episodes.  From outpatient office, they have been trying to increase his Lasix, at this point he is on 80 mg of Lasix a day with no improvement. ED Course: Patient is hemodynamically stable.  He is on room air.  He has elevated BNP.  He has 3+ edema.  Chest x-ray shows prominent vasculature.  Minimal pleural effusion.  Twelve-lead EKG is normal.  Due to significant weight gain and no response to oral diuretic therapy, requested admission to the hospital.  Hospital Course   Daniel Valenzuela is a 68  y.o. male with a history of morbid obesity, T2DM, OSA not on CPAP, chronic diastolic CHF, HTN, HLD, and GERD who presented to the ED with 4 weeks of increasing shortness of breath, orthopnea, PND in the setting of ~30lbs weight gain slowly over the past 4-6 weeks initially in legs, but now also in abdomen. He had been evaluated previously for syncope, told to drink plenty of fluid and thinks he may now have been drinking too much fluid. Weights in records show 382lbs in March 2019 and was 408lbs on admission. BNP was elevated and he was grossly overloaded on exam, so was started on lasix and admitted. Cardiology consulted 1/14 and diuresis augmented.  2D echo showing drop in his EF from 60% to 35%.  He  went for right/left heart cath, see discussion below.  Acute on chronic combined diastolic/systolic CHF . -Significantly volume overloaded, he reports 30 pound weight gain over the last month despite increasing his Lasix as an outpatient, apparently he is not been following low-salt diet during the holidays, most likely contributed to his volume overload . -Patient massively volume overload on admission, significantly improved with diuresis, kept on IV Lasix 80 mg IV twice daily, which was augmented with metolazone 5 mg as needed, overall he -20 L during hospital stay, -4 L over last 24 hours, renal function has remained stable, will be discharged today, have discharged on Lasix 60 mg oral daily, to follow with cardiology as an outpatient to adjust this dose as needed, he was counseled again about fluid restriction, wearing himself daily and low-salt diet. -2D echo drop in EF from 60 to 35%, left cardiac cath significant for nonobstructive CAD, recommendation for optimized medical management, management per cardiology, LDL is 32. -Started on low-dose Toprol-XL 25 mg oral daily on discharge per cardiology recommendations, His ARB has been stopped on discharge on during hospital stay given soft blood  pressure  OSA:  -Patient reports history of sleep apnea, used to be on CPAP, but he stopped using it after his weight dropped 90 pounds.  Aortic stenosis -Management per cardiology, moderate per cardiac cath  IDT2DM with peripheral neuropathy: Last Hb1c 9.4% -Requiring 40 units of NPH was daily at home, during hospital stay he is more compliant with diet, he is currently on 10 units twice daily, -A1c is 9 - Continue gabapentin  Morbid obesity:  - Dietitian consulted  HLD: - Continue statin  GERD:  - Continue PPI  Depression:  - Continue SSRI  Chronic back pain:  - Continue home medication    Discharge Condition:  Stable   Follow UP  Follow-up Information    [facility] Follow up.   Specialty:  Cardiology Why:  You have a hospital follow-up visit scheduled for 08/31/2018 at 3:30pm. Please also go by our office next Monday 08/24/2018 to recheck your kidney function and electrolytes.  Contact information: [address] [place] [address] [phone]       Carylon Perches, MD Follow up.   Specialty:  Internal Medicine Contact information: [address] [place] [address] [phone]             Discharge Instructions  and  Discharge Medications    Discharge Instructions    Discharge instructions   Complete by:  As directed    Follow with Primary MD Carylon Perches, MD in 7 days   Get CBC, CMP,  checked  by Primary MD next visit.    Activity: As tolerated with Full fall precautions use walker/cane & assistance as needed   Disposition Home   Diet: Heart Healthy , low salt,  carbohydrate modified.  For Heart failure patients - Check your Weight same time everyday, if you gain over 2 pounds, or you develop in leg swelling, experience more shortness of breath or chest pain, call your Primary MD immediately. Follow Cardiac Low Salt Diet and 1.5 lit/day fluid restriction.   On your next visit with your  primary care physician please Get Medicines reviewed and adjusted.   Please request your Prim.MD to go over all Hospital Tests and Procedure/Radiological results at the follow up, please get all Hospital records sent to your Prim MD by signing hospital release before you go home.   If you experience worsening of your admission symptoms, develop shortness of breath, life threatening emergency, suicidal or homicidal thoughts you must seek medical attention immediately by calling 911 or calling your MD immediately  if symptoms less severe.  You Must read complete instructions/literature along with all the possible adverse reactions/side effects for all the Medicines you take and that have been prescribed to you. Take any new Medicines after you have completely understood and accpet all the possible adverse reactions/side effects.   Do not drive, operating heavy machinery, perform activities at heights, swimming or participation in water activities or provide baby sitting services if your were admitted for syncope or siezures until you have seen by Primary MD or a Neurologist and advised to do so again.  Do not drive when taking Pain medications.    Do not take more than prescribed Pain, Sleep and Anxiety Medications  Special Instructions: If you have smoked or chewed Tobacco  in the last 2 yrs please stop smoking, stop any regular Alcohol  and or any Recreational drug use.  Wear Seat belts while driving.   Please note  You were cared for by a hospitalist during your hospital stay. If you have any questions about your discharge medications or the care you received while you were in the hospital after you are discharged, you can call the unit and asked to speak with the hospitalist on call if the hospitalist that took care of you is not available. Once you are discharged, your primary care physician will handle any further medical issues. Please note that NO REFILLS for any discharge medications  will be authorized once you are discharged, as it is imperative that you return to your primary care  physician (or establish a relationship with a primary care physician if you do not have one) for your aftercare needs so that they can reassess your need for medications and monitor your lab values.   Increase activity slowly   Complete by:  As directed      Allergies as of 08/18/2018      Reactions   Methyldopa-hydrochlorothiazide Other (See Comments)   GYNOCOMASTIA   Amoxicillin    rash   Motrin [ibuprofen]    Chest swelling      Medication List    STOP taking these medications   amLODipine 5 MG tablet Commonly known as:  NORVASC   insulin regular 100 units/mL injection Commonly known as:  NOVOLIN R,HUMULIN R   losartan 100 MG tablet Commonly known as:  COZAAR     TAKE these medications   atorvastatin 40 MG tablet Commonly known as:  LIPITOR Take 40 mg by mouth every evening.   B-12 5000 MCG Caps Take 1 capsule by mouth every morning.   CAL-MAG-ZINC-D PO Take 1 tablet by mouth daily.   escitalopram 10 MG tablet Commonly known as:  LEXAPRO Take 10 mg by mouth every evening.   famotidine 40 MG tablet Commonly known as:  PEPCID Take 40 mg by mouth at bedtime.   furosemide 40 MG tablet Commonly known as:  LASIX Take 1.5 tablets (60 mg total) by mouth daily. What changed:    how much to take  when to take this   gabapentin 600 MG tablet Commonly known as:  NEURONTIN Take 600-1,200 mg by mouth 2 (two) times daily.  in the morning and  at bedtime   HYDROcodone-acetaminophen 5-325 MG tablet Commonly known as:  NORCO/VICODIN Take 1 tablet by mouth every 4 (four) hours as needed for moderate pain.   insulin NPH Human 100 UNIT/ML injection Commonly known as:  HUMULIN N,NOVOLIN N Inject 0.1 mLs (10 Units total) into the skin 2 (two) times daily before a meal. What changed:  how much to take   metoprolol succinate 25 MG 24 hr tablet Commonly known  as:  TOPROL XL Take 1 tablet (25 mg total) by mouth daily.   multivitamin with minerals Tabs tablet Take 1 tablet by mouth every morning.   pantoprazole 40 MG tablet Commonly known as:  PROTONIX Take 40 mg by mouth every morning.         Diet and Activity recommendation: See Discharge Instructions above   Consults obtained -  Cardiology   Major procedures and Radiology Reports - PLEASE review detailed and final reports for all details, in brief -    Right/Left cardiac cath and coronary angiography   Conclusions: 1. Mild to moderate, non-obstructive CAD. 2. Mildly to moderately elevated left heart filling pressures. 3. Moderately elevated right heart and pulmonary artery pressures.  Prominent RA tracing V-waves raise possibility of significant tricuspid regurgitation. 4. Low normal Fick cardiac output/index. 5. Moderate aortic stenosis.  Recommendations: 1. Continue diuresis. 2. Further workup and management of aortic stenosis per structural heart team. 3. Medical therapy to prevent progression of coronary artery disease.  Yvonne Kendall, MD [facility] Pager: [phone]   Dg Chest 2 View  Result Date: 08/10/2018 CLINICAL DATA:  Shortness of breath, weight gain. EXAM: CHEST - 2 VIEW COMPARISON:  Radiographs of August 30, 2017. FINDINGS: Stable cardiomegaly. Increased bilateral pulmonary edema is noted. Mild bibasilar subsegmental atelectasis is now noted with small pleural effusions. No pneumothorax is noted. Bony thorax is unremarkable. IMPRESSION: Bilateral pulmonary edema  is noted with probable bibasilar subsegmental atelectasis and small pleural effusions. Electronically Signed   By: Lupita Raider, M.D.   On: 08/10/2018 12:47   : ECHO ------------------------------------------------------------------- LV EF: 35% -   40%  ------------------------------------------------------------------- Indications:      Aortic stenosis  424.1.  ------------------------------------------------------------------- History:   PMH:  Risk factors: Hypertension. Diabetes mellitus. Obese.  ------------------------------------------------------------------- Study Conclusions  - Left ventricle: The cavity size was normal. Systolic function was   moderately reduced. The estimated ejection fraction was in the   range of 35% to 40%. Diffuse hypokinesis. - Aortic valve: Valve mobility was restricted. There was moderate   to severe stenosis. Valve area (VTI): 1.34 cm^2. Valve area   (Vmax): 1.49 cm^2. Valve area (Vmean): 1.33 cm^2. - Mitral valve: Calcified annulus. - Pericardium, extracardiac: A trivial pericardial effusion was   identified.  Impressions:  - Limited study to assess AS; technically difficult; probable   moderate LV dysfunction; calcified aortic valve with moderate to   severe AS (mean gradient 28 mmHg; AVA by continuity equation 1.34   cm2; however dimensionless index of .23 suggestive of severe AS).  ------------------------------------------------------------------- Study data:  Comparison was made to the study of 08/12/2018.  Study status:  Routine.  Procedure:  The patient reported no pain pre or post test. Transthoracic echocardiography. Image quality was adequate.  Study completion:  There were no complications. Transthoracic echocardiography.  M-mode, limited 2D, limited spectral Doppler, and color Doppler.  Birthdate:  Patient birthdate: 12/24/1950.  Age:  Patient is 68 yr old.  Sex:  Gender: male.    BMI: 54.7 kg/m^2.  Blood pressure:     110/72  Patient status:  Inpatient.  Study date:  Study date: 08/13/2018. Study time: 11:23 AM.  Location:  Bedside.  -------------------------------------------------------------------  ------------------------------------------------------------------- Left ventricle:  The cavity size was normal. Systolic function was moderately reduced. The estimated  ejection fraction was in the range of 35% to 40%. Diffuse hypokinesis.  ------------------------------------------------------------------- Aortic valve:   Trileaflet; moderately calcified leaflets. Valve mobility was restricted.  Doppler:   There was moderate to severe stenosis.   There was no regurgitation.    VTI ratio of LVOT to aortic valve: 0.23. Valve area (VTI): 1.34 cm^2. Indexed valve area (VTI): 0.43 cm^2/m^2. Peak velocity ratio of LVOT to aortic valve: 0.26. Valve area (Vmax): 1.49 cm^2. Indexed valve area (Vmax): 0.47 cm^2/m^2. Mean velocity ratio of LVOT to aortic valve: 0.23. Valve area (Vmean): 1.33 cm^2. Indexed valve area (Vmean): 0.42 cm^2/m^2.    Mean gradient (S): 28 mm Hg. Peak gradient (S): 47 mm Hg.  ------------------------------------------------------------------- Aorta:  Aortic root: The aortic root was normal in size.  ------------------------------------------------------------------- Mitral valve:   Calcified annulus. Mobility was not restricted.   ------------------------------------------------------------------- Pericardium:  A trivial pericardial effusion was identified.  ------------------------------------------------------------------- Measurements   Left ventricle                           Value  Stroke volume, 2D                        95    ml  Stroke volume/bsa, 2D                    30    ml/m^2    LVOT  Value  LVOT ID, S                               27    mm  LVOT area                                5.73  cm^2  LVOT peak velocity, S                    89.2  cm/s  LVOT mean velocity, S                    58.6  cm/s  LVOT VTI, S                              16.5  cm  LVOT peak gradient, S                    3     mm Hg    Aortic valve                             Value  Aortic valve peak velocity, S            343   cm/s  Aortic valve mean velocity, S            252   cm/s  Aortic valve VTI, S                       70.7  cm  Aortic mean gradient, S                  28    mm Hg  Aortic peak gradient, S                  47    mm Hg  VTI ratio, LVOT/AV                       0.23  Aortic valve area, VTI                   1.34  cm^2  Aortic valve area/bsa, VTI               0.43  cm^2/m^2  Velocity ratio, peak, LVOT/AV            0.26  Aortic valve area, peak velocity         1.49  cm^2  Aortic valve area/bsa, peak velocity     0.47  cm^2/m^2  Velocity ratio, mean, LVOT/AV            0.23  Aortic valve area, mean velocity         1.33  cm^2  Aortic valve area/bsa, mean velocity     0.42  cm^2/m^2    Aorta                                    Value  Ascending aorta ID, A-P, S               35    mm  Legend: (L)  and  (  H)  mark values outside specified reference range.  ------------------------------------------------------------------- Prepared and Electronically Authenticated by  Olga MillersBrian Crenshaw 2020-01-16T15:54:32 Micro Results     No results found for this or any previous visit (from the past 240 hour(s)).     Today   Subjective:   Daniel Valenzuela today has no headache,no chest or abdominal pain,no new weakness tingling or numbness, feels much better wants to go home today.   Objective:   Blood pressure 120/76, pulse 98, temperature 97.7 F (36.5 C), temperature source Oral, resp. rate 16, height 6' (1.829 m), weight (!) 167.3 kg, SpO2 96 %.   Intake/Output Summary (Last 24 hours) at 08/18/2018 1301 Last data filed at 08/18/2018 1133 Gross per 24 hour  Intake 846 ml  Output 5000 ml  Net -4154 ml    Exam Awake Alert, Oriented x 3, No new F.N deficits, Normal affect Shorter.AT,PERRAL Supple Neck,No JVD, No cervical lymphadenopathy appriciated.  Symmetrical Chest wall movement, Good air movement bilaterally, CTAB RRR,No Gallops,Rubs or new Murmurs, No Parasternal Heave +ve B.Sounds, Abd Soft, Non tender, No organomegaly appriciated, No rebound -guarding or  rigidity. No Cyanosis, Clubbing , +2 edema, No new Rash or bruise  Data Review   CBC w Diff:  Lab Results  Component Value Date   WBC 7.1 08/15/2018   HGB 12.1 (L) 08/15/2018   HCT 39.3 08/15/2018   PLT 248 08/15/2018   LYMPHOPCT 22 08/10/2018   MONOPCT 10 08/10/2018   EOSPCT 1 08/10/2018   BASOPCT 0 08/10/2018    CMP:  Lab Results  Component Value Date   NA 140 08/17/2018   K 3.7 08/17/2018   CL 97 (L) 08/17/2018   CO2 32 08/17/2018   BUN 10 08/17/2018   CREATININE 0.93 08/17/2018   PROT 6.3 (L) 08/10/2018   ALBUMIN 3.0 (L) 08/10/2018   BILITOT 1.0 08/10/2018   ALKPHOS 87 08/10/2018   AST 24 08/10/2018   ALT 19 08/10/2018  .   Total Time in preparing paper work, data evaluation and todays exam - 35 minutes  Huey Bienenstockawood Kahlani Graber M.D on 08/18/2018 at 1:01 PM  [facility]   Office  [phone]

## 2018-08-18 NOTE — Progress Notes (Signed)
Pt discharge instructions reviewed with pt and wife. Pt and wife verbalize understanding and state they have no questions. Pt belongings with pt. [name] brought pt meds. Pt is not in distress. Pt discharged via wheelchair. Pt's wife is driving him home.

## 2018-08-18 NOTE — Plan of Care (Signed)
  Problem: Education: Goal: Knowledge of General Education information will improve Description Including pain rating scale, medication(s)/side effects and non-pharmacologic comfort measures Outcome: Progressing   Problem: Health Behavior/Discharge Planning: Goal: Ability to manage health-related needs will improve Outcome: Progressing   Problem: Clinical Measurements: Goal: Ability to maintain clinical measurements within normal limits will improve Outcome: Progressing   Problem: Activity: Goal: Risk for activity intolerance will decrease Outcome: Progressing   Problem: Nutrition: Goal: Adequate nutrition will be maintained Outcome: Progressing   Problem: Coping: Goal: Level of anxiety will decrease Outcome: Progressing   Problem: Skin Integrity: Goal: Risk for impaired skin integrity will decrease Outcome: Progressing   Problem: Education: Goal: Ability to demonstrate management of disease process will improve Outcome: Progressing   Problem: Cardiac: Goal: Ability to achieve and maintain adequate cardiopulmonary perfusion will improve Outcome: Progressing

## 2018-08-18 NOTE — Discharge Instructions (Signed)
Follow with Primary MD Carylon Perches, MD in 7 days   Get CBC, CMP,  checked  by Primary MD next visit.    Activity: As tolerated with Full fall precautions use walker/cane & assistance as needed   Disposition Home   Diet: Heart Healthy , low salt,  carbohydrate modified.  For Heart failure patients - Check your Weight same time everyday, if you gain over 2 pounds, or you develop in leg swelling, experience more shortness of breath or chest pain, call your Primary MD immediately. Follow Cardiac Low Salt Diet and 1.5 lit/day fluid restriction.   On your next visit with your primary care physician please Get Medicines reviewed and adjusted.   Please request your Prim.MD to go over all Hospital Tests and Procedure/Radiological results at the follow up, please get all Hospital records sent to your Prim MD by signing hospital release before you go home.   If you experience worsening of your admission symptoms, develop shortness of breath, life threatening emergency, suicidal or homicidal thoughts you must seek medical attention immediately by calling 911 or calling your MD immediately  if symptoms less severe.  You Must read complete instructions/literature along with all the possible adverse reactions/side effects for all the Medicines you take and that have been prescribed to you. Take any new Medicines after you have completely understood and accpet all the possible adverse reactions/side effects.   Do not drive, operating heavy machinery, perform activities at heights, swimming or participation in water activities or provide baby sitting services if your were admitted for syncope or siezures until you have seen by Primary MD or a Neurologist and advised to do so again.  Do not drive when taking Pain medications.    Do not take more than prescribed Pain, Sleep and Anxiety Medications  Special Instructions: If you have smoked or chewed Tobacco  in the last 2 yrs please stop smoking, stop  any regular Alcohol  and or any Recreational drug use.  Wear Seat belts while driving.   Please note  You were cared for by a hospitalist during your hospital stay. If you have any questions about your discharge medications or the care you received while you were in the hospital after you are discharged, you can call the unit and asked to speak with the hospitalist on call if the hospitalist that took care of you is not available. Once you are discharged, your primary care physician will handle any further medical issues. Please note that NO REFILLS for any discharge medications will be authorized once you are discharged, as it is imperative that you return to your primary care physician (or establish a relationship with a primary care physician if you do not have one) for your aftercare needs so that they can reassess your need for medications and monitor your lab values.

## 2018-08-20 DIAGNOSIS — M48061 Spinal stenosis, lumbar region without neurogenic claudication: Secondary | ICD-10-CM | POA: Diagnosis not present

## 2018-08-24 ENCOUNTER — Other Ambulatory Visit (HOSPITAL_COMMUNITY)
Admission: RE | Admit: 2018-08-24 | Discharge: 2018-08-24 | Disposition: A | Discharge status: Home / Self Care | Payer: Medicare Other | Source: Ambulatory Visit | Attending: Student | Admitting: Student

## 2018-08-24 DIAGNOSIS — I5022 Chronic systolic (congestive) heart failure: Secondary | ICD-10-CM | POA: Diagnosis not present

## 2018-08-24 DIAGNOSIS — I5033 Acute on chronic diastolic (congestive) heart failure: Secondary | ICD-10-CM

## 2018-08-24 DIAGNOSIS — I350 Nonrheumatic aortic (valve) stenosis: Secondary | ICD-10-CM | POA: Diagnosis not present

## 2018-08-24 DIAGNOSIS — E1129 Type 2 diabetes mellitus with other diabetic kidney complication: Secondary | ICD-10-CM | POA: Diagnosis not present

## 2018-08-24 LAB — BASIC METABOLIC PANEL
Anion gap: 9 (ref 5–15)
BUN: 17 mg/dL (ref 8–23)
CO2: 34 mmol/L — ABNORMAL HIGH (ref 22–32)
Calcium: 8.7 mg/dL — ABNORMAL LOW (ref 8.9–10.3)
Chloride: 96 mmol/L — ABNORMAL LOW (ref 98–111)
Creatinine, Ser: 0.76 mg/dL (ref 0.61–1.24)
GFR calc Af Amer: >60 mL/min (ref >60)
GFR calc non Af Amer: >60 mL/min (ref >60)
Glucose, Bld: 227 mg/dL — ABNORMAL HIGH (ref 70–99)
Potassium: 3.3 mmol/L — ABNORMAL LOW (ref 3.5–5.1)
Sodium: 139 mmol/L (ref 135–145)

## 2018-08-25 ENCOUNTER — Other Ambulatory Visit: Payer: Self-pay | Admitting: Student

## 2018-08-25 MED ORDER — POTASSIUM CHLORIDE CRYS ER 20 MEQ PO TBCR: 20.0000 meq | EXTENDED_RELEASE_TABLET | Freq: Every day | ORAL | 2 refills | Status: DC

## 2018-08-25 NOTE — Progress Notes (Signed)
   Patient had BMP rechecked yesterday after being discharged on Lasix 60mg  daily last week. Serum creatinine stable but potassium slightly low at 3.3. Prescribed K-Dur . Patient to take 1 tablet twice daily for 2 days and then 1 tablet daily until follow-up appointment on 08/31/2018. Will recheck BMP at that time. Spoke with patient and his wife who voiced understanding.  Corrin Parker, PA-C 08/25/2018 2:19 PM

## 2018-08-27 ENCOUNTER — Ambulatory Visit (INDEPENDENT_AMBULATORY_CARE_PROVIDER_SITE_OTHER): Payer: Medicare Other

## 2018-08-27 DIAGNOSIS — I639 Cerebral infarction, unspecified: Secondary | ICD-10-CM

## 2018-08-28 LAB — CUP PACEART REMOTE DEVICE CHECK
Date Time Interrogation Session: 20200130233739
Implantable Pulse Generator Implant Date: 20190205

## 2018-08-31 ENCOUNTER — Ambulatory Visit (INDEPENDENT_AMBULATORY_CARE_PROVIDER_SITE_OTHER): Payer: Medicare Other | Admitting: Cardiology

## 2018-08-31 ENCOUNTER — Encounter: Payer: Self-pay | Admitting: Cardiology

## 2018-08-31 ENCOUNTER — Other Ambulatory Visit (HOSPITAL_COMMUNITY)
Admission: RE | Admit: 2018-08-31 | Discharge: 2018-08-31 | Disposition: A | Discharge status: Home / Self Care | Payer: Medicare Other | Source: Ambulatory Visit | Attending: Cardiology | Admitting: Cardiology

## 2018-08-31 VITALS — BP 120/70 | HR 77 | Ht 71.0 in | Wt 356.0 lb

## 2018-08-31 DIAGNOSIS — G4733 Obstructive sleep apnea (adult) (pediatric): Secondary | ICD-10-CM

## 2018-08-31 DIAGNOSIS — I350 Nonrheumatic aortic (valve) stenosis: Secondary | ICD-10-CM

## 2018-08-31 DIAGNOSIS — R55 Syncope and collapse: Secondary | ICD-10-CM

## 2018-08-31 DIAGNOSIS — E119 Type 2 diabetes mellitus without complications: Secondary | ICD-10-CM

## 2018-08-31 DIAGNOSIS — G8929 Other chronic pain: Secondary | ICD-10-CM | POA: Diagnosis not present

## 2018-08-31 DIAGNOSIS — Z794 Long term (current) use of insulin: Secondary | ICD-10-CM

## 2018-08-31 DIAGNOSIS — I5041 Acute combined systolic (congestive) and diastolic (congestive) heart failure: Secondary | ICD-10-CM | POA: Insufficient documentation

## 2018-08-31 DIAGNOSIS — M545 Low back pain, unspecified: Secondary | ICD-10-CM

## 2018-08-31 DIAGNOSIS — I2510 Atherosclerotic heart disease of native coronary artery without angina pectoris: Secondary | ICD-10-CM

## 2018-08-31 DIAGNOSIS — Z6841 Body Mass Index (BMI) 40.0 and over, adult: Secondary | ICD-10-CM

## 2018-08-31 LAB — BASIC METABOLIC PANEL
Anion gap: 7 (ref 5–15)
BUN: 17 mg/dL (ref 8–23)
CO2: 32 mmol/L (ref 22–32)
Calcium: 8.8 mg/dL — ABNORMAL LOW (ref 8.9–10.3)
Chloride: 101 mmol/L (ref 98–111)
Creatinine, Ser: 1.1 mg/dL (ref 0.61–1.24)
GFR calc Af Amer: >60 mL/min (ref >60)
GFR calc non Af Amer: >60 mL/min (ref >60)
Glucose, Bld: 159 mg/dL — ABNORMAL HIGH (ref 70–99)
Potassium: 4.9 mmol/L (ref 3.5–5.1)
Sodium: 140 mmol/L (ref 135–145)

## 2018-08-31 LAB — BRAIN NATRIURETIC PEPTIDE: B Natriuretic Peptide: 298 pg/mL — ABNORMAL HIGH (ref 0.0–100.0)

## 2018-08-31 NOTE — Progress Notes (Signed)
08/31/2018 Daniel Valenzuela   [birth year]  [id number]  Primary Physician Carylon Perches, MD Primary Cardiologist: Dr Ladona Ridgel- EP  HPI: The patient is a pleasant 68 year old morbidly obese male from [place] who is here in the office today with his wife as a post hospital follow-up.  The patient has a history of syncope.  In February 2019 he had a loop recorder implanted.  He had recurrent syncope in March 2019 and the loop interrogation showed no arrhythmia.  Dr. Ladona Ridgel feels that his syncope is secondary to autonomic dysfunction.  The patient has morbid obesity with a BMI of over 50.  He has documented sleep apnea in the past but has not been on CPAP "for years".  He has diabetes, hypertension, and dyslipidemia as well.  He has chronic back pain and is followed in [place].  He presented to the hospital August 11, 2018 with acute combined CHF.  Echocardiogram revealed new LV dysfunction with an EF of 35 to 40%.  Study was limited.  The echocardiogram indicated the patient probably had at least moderate, possibly severe AS with a mean gradient of 28 mmHg and an aortic valve area of 1.34 cm.  The patient underwent right and left heart catheterization August 14, 2018.  This revealed mild nonobstructive coronary disease.  There were prominent RA V waves suggesting significant TR.  By catheterization he had moderate aortic stenosis.  Plan is for medical therapy.    When the patient was admitted 08/10/2018 he was 408 pounds.  By discharge 08/18/2018 he had gotten down to 368 pounds.  He seen in the office today for follow-up.  His weight today is 356 pounds.  He says he feels much better although he admits that he is at times weak.  He has not had syncope.  He is going to start water aerobics and walking as tolerated.  He tells me he recently was in [place] to get a back injection and is back is feeling better currently.   Current Outpatient Medications  Medication Sig Dispense Refill  .  atorvastatin (LIPITOR) 40 MG tablet Take 40 mg by mouth every evening.    . Cyanocobalamin (B-12) 5000 MCG CAPS Take 1 capsule by mouth every morning.    . escitalopram (LEXAPRO) 10 MG tablet Take 10 mg by mouth every evening.    . famotidine (PEPCID) 40 MG tablet Take 40 mg by mouth at bedtime.     . furosemide (LASIX) 40 MG tablet Take 1.5 tablets (60 mg total) by mouth daily. 45 tablet 0  . gabapentin (NEURONTIN) 600 MG tablet Take 600-1,200 mg by mouth 2 (two) times daily. 600mg  in the morning and 1200mg  at bedtime    . HYDROcodone-acetaminophen (NORCO/VICODIN) 5-325 MG tablet Take 1 tablet by mouth every 4 (four) hours as needed for moderate pain.    Marland Kitchen insulin NPH Human (HUMULIN N,NOVOLIN N) 100 UNIT/ML injection Inject 0.1 mLs (10 Units total) into the skin 2 (two) times daily before a meal. (Patient taking differently: Inject 40 Units into the skin 2 (two) times daily before a meal. ) 10 mL 11  . insulin regular (NOVOLIN R,HUMULIN R) 100 units/mL injection Inject into the skin 3 (three) times daily before meals.    . metoprolol succinate (TOPROL XL) 25 MG 24 hr tablet Take 1 tablet (25 mg total) by mouth daily. 30 tablet 0  . Multiple Minerals-Vitamins (CAL-MAG-ZINC-D PO) Take 1 tablet by mouth daily.    . Multiple Vitamin (MULTIVITAMIN WITH MINERALS)  TABS tablet Take 1 tablet by mouth every morning.    . pantoprazole (PROTONIX) 40 MG tablet Take 40 mg by mouth every morning.    . potassium chloride SA (K-DUR,KLOR-CON) 20 MEQ tablet Take 1 tablet (20 mEq total) by mouth daily. 1 tab BID x2 days, then 1 tab daily or as directed 45 tablet 2   No current facility-administered medications for this visit.     Allergies  Allergen Reactions  . Methyldopa-Hydrochlorothiazide Other (See Comments)    GYNOCOMASTIA  . Amoxicillin     rash  . Motrin [Ibuprofen]     Chest swelling    Past Medical History:  Diagnosis Date  . Chronic back pain    "mainly lower right now" (08/12/2018)  .  Depression   . Diabetic neuropathy (HCC)   . GERD (gastroesophageal reflux disease)   . Heart murmur   . High cholesterol   . Hypertension   . Obesity   . Rheumatoid arteritis (HCC)    "hands" (08/12/2018)  . Sleep apnea    "lost weight; not on CPAP anymore" (08/12/2018)  . Spinal stenosis of lumbar region    "w/slipped disc" (08/12/2018)  . Type II diabetes mellitus (HCC)     Social History   Socioeconomic History  . Marital status: Married    Spouse name: Not on file  . Number of children: Not on file  . Years of education: Not on file  . Highest education level: Not on file  Occupational History  . Not on file  Social Needs  . Financial resource strain: Not on file  . Food insecurity:    Worry: Not on file    Inability: Not on file  . Transportation needs:    Medical: Not on file    Non-medical: Not on file  Tobacco Use  . Smoking status: Current Some Day Smoker    Types: Cigars  . Smokeless tobacco: Never Used  Substance and Sexual Activity  . Alcohol use: No    Frequency: Never  . Drug use: Never  . Sexual activity: Not Currently  Lifestyle  . Physical activity:    Days per week: Not on file    Minutes per session: Not on file  . Stress: Not on file  Relationships  . Social connections:    Talks on phone: Not on file    Gets together: Not on file    Attends religious service: Not on file    Active member of club or organization: Not on file    Attends meetings of clubs or organizations: Not on file    Relationship status: Not on file  . Intimate partner violence:    Fear of current or ex partner: Not on file    Emotionally abused: Not on file    Physically abused: Not on file    Forced sexual activity: Not on file  Other Topics Concern  . Not on file  Social History Narrative  . Not on file     Family History  Problem Relation Age of Onset  . Cancer Mother   . Diabetes Mother   . Parkinson's disease Father   . Heart failure Father       Review of Systems: General: negative for chills, fever, night sweats or weight changes.  Cardiovascular: negative for chest pain, edema, orthopnea, palpitations, paroxysmal nocturnal dyspnea  Dermatological: negative for rash Respiratory: negative for cough or wheezing Urologic: negative for hematuria Abdominal: negative for nausea, vomiting, diarrhea, bright red blood per  rectum, melena, or hematemesis Neurologic: negative for visual changes, syncope, or dizziness All other systems reviewed and are otherwise negative except as noted above.    Blood pressure 120/70, pulse 77, height 5\' 11"  (1.803 m), weight (!) 356 lb (161.5 kg), SpO2 93 %.  General appearance: alert, cooperative, no distress and morbidly obese Lungs: clear to auscultation bilaterally Heart: regular rate and rhythm and 2/6 systolic murmur AS Extremities: no LE edema Skin: pale, warm, dry Neurologic: Grossly normal  ASSESSMENT AND PLAN:   Acute combined systolic and diastolic CHF Admitted 08/11/2018 with acute CHF. Echo showed new LVF- 35-40%.  Cath mild CAD.  CHF felt to be secondary to dietary indiscretion.   Aortic valve stenosis Moderate to severe  (by echo) to moderate (by cath) AS  Syncope Felt to be vasovagal.  Loop implanted Feb 2019- no arrhythmia documented   Morbid obesity with BMI of 45.0-49.9, adult (HCC) BM 49  Sleep apnea Not on C-pap "for years"- will repeat study   PLAN  Check CMP and BNP today.  It will be difficult to determine when he is at his dry weight secondary to obesity and we will need to follow his labs closley.  BMP done 08/24/2018 showed a BUN of 17/ SCr of 0.76.  Continue current dose of Lasix till we see a bump in his renal function.  Add an ARB when volume status is stable.  Check an echo for LVF in 3 months.  He will need to be established with a cardiologist in [employer], for now f/u with APP in a few weeks.  I suggested a repeat sleep study and he was agreeable.   Dr  Excell Seltzer did not feel the patients AS was significant enough to consider intervention but if there patient does not do well after diuresis this may need to be re evaluated.   Corine Shelter PA-C 08/31/2018 4:09 PM

## 2018-08-31 NOTE — Assessment & Plan Note (Addendum)
Moderate to severe  (by echo) to moderate (by cath) AS

## 2018-08-31 NOTE — Assessment & Plan Note (Signed)
Not on C-pap "for years"- will repeat study

## 2018-08-31 NOTE — Assessment & Plan Note (Signed)
Felt to be vasovagal.  Loop implanted Feb 2019- no arrhythmia documented

## 2018-08-31 NOTE — Assessment & Plan Note (Signed)
Admitted 08/11/2018 with acute CHF. Echo showed new LVF- 35-40%.  Cath mild CAD.  CHF felt to be secondary to dietary indiscretion.

## 2018-08-31 NOTE — Assessment & Plan Note (Signed)
BM 49

## 2018-08-31 NOTE — Patient Instructions (Signed)
Medication Instructions:  Your physician recommends that you continue on your current medications as directed. Please refer to the Current Medication list given to you today.  If you need a refill on your cardiac medications before your next appointment, please call your pharmacy.   Lab work: Your physician recommends that you return for lab work in: Today   If you have labs (blood work) drawn today and your tests are completely normal, you will receive your results only by: Marland Kitchen MyChart Message (if you have MyChart) OR . A paper copy in the mail If you have any lab test that is abnormal or we need to change your treatment, we will call you to review the results.  Testing/Procedures: NONE   Follow-Up: At [facility], you and your health needs are our priority.  As part of our continuing mission to provide you with exceptional heart care, we have created designated Provider Care Teams.  These Care Teams include your primary Cardiologist (physician) and Advanced Practice Providers (APPs -  Physician Assistants and Nurse Practitioners) who all work together to provide you with the care you need, when you need it. You will need a follow up appointment in 4-6  weeks.  Please call our office 2 months in advance to schedule this appointment.  You may see Lewayne Bunting, MD or one of the following Advanced Practice Providers on your designated Care Team:   Randall An, PA-C [facility]) . Jacolyn Reedy, PA-C [facility] Office)  Any Other Special Instructions Will Be Listed Below (If Applicable). Thank you for choosing [facility]!

## 2018-09-01 ENCOUNTER — Telehealth: Payer: Self-pay | Admitting: *Deleted

## 2018-09-01 DIAGNOSIS — M659 Synovitis and tenosynovitis, unspecified: Secondary | ICD-10-CM | POA: Diagnosis not present

## 2018-09-01 DIAGNOSIS — G5601 Carpal tunnel syndrome, right upper limb: Secondary | ICD-10-CM | POA: Diagnosis not present

## 2018-09-01 DIAGNOSIS — M2550 Pain in unspecified joint: Secondary | ICD-10-CM | POA: Diagnosis not present

## 2018-09-01 DIAGNOSIS — E1140 Type 2 diabetes mellitus with diabetic neuropathy, unspecified: Secondary | ICD-10-CM | POA: Diagnosis not present

## 2018-09-01 DIAGNOSIS — R768 Other specified abnormal immunological findings in serum: Secondary | ICD-10-CM | POA: Diagnosis not present

## 2018-09-01 DIAGNOSIS — Z8679 Personal history of other diseases of the circulatory system: Secondary | ICD-10-CM | POA: Diagnosis not present

## 2018-09-01 DIAGNOSIS — E1149 Type 2 diabetes mellitus with other diabetic neurological complication: Secondary | ICD-10-CM | POA: Diagnosis not present

## 2018-09-01 DIAGNOSIS — Z79899 Other long term (current) drug therapy: Secondary | ICD-10-CM

## 2018-09-01 DIAGNOSIS — I350 Nonrheumatic aortic (valve) stenosis: Secondary | ICD-10-CM | POA: Diagnosis not present

## 2018-09-01 MED ORDER — POTASSIUM CHLORIDE CRYS ER 20 MEQ PO TBCR: 20.0000 meq | EXTENDED_RELEASE_TABLET | Freq: Every day | ORAL | 3 refills | Status: DC

## 2018-09-01 NOTE — Telephone Encounter (Signed)
Pt notified order placed 

## 2018-09-01 NOTE — Telephone Encounter (Signed)
-----   Message from Abelino Derrick, [place] sent at 09/01/2018  7:51 AM EST ----- Please tell the patient his labs looked good, continue same Rx except decrease his K+ to 20 Meq daily. Check another BMP in 2-3 weeks, this can be done by a lab closer to him and have results forwarded to [place].  Corine Shelter PA-C 09/01/2018 7:50 AM

## 2018-09-02 ENCOUNTER — Telehealth: Payer: Self-pay | Admitting: Cardiology

## 2018-09-02 NOTE — Telephone Encounter (Signed)
Will forward to [employer], [place]

## 2018-09-02 NOTE — Telephone Encounter (Signed)
Pt's wife called to inform Daniel Valenzuela that Daniel Valenzuela is going to a Dr. In [place] that can order his sleep study.

## 2018-09-03 NOTE — Progress Notes (Signed)
[facility]link Summary Report / Loop Recorder 

## 2018-09-09 ENCOUNTER — Encounter: Payer: Self-pay | Admitting: Internal Medicine

## 2018-09-09 ENCOUNTER — Ambulatory Visit (INDEPENDENT_AMBULATORY_CARE_PROVIDER_SITE_OTHER): Payer: Medicare Other | Admitting: Internal Medicine

## 2018-09-09 VITALS — BP 119/68 | HR 79 | Ht 71.0 in | Wt 350.0 lb

## 2018-09-09 DIAGNOSIS — I2510 Atherosclerotic heart disease of native coronary artery without angina pectoris: Secondary | ICD-10-CM

## 2018-09-09 DIAGNOSIS — I459 Conduction disorder, unspecified: Secondary | ICD-10-CM | POA: Diagnosis not present

## 2018-09-09 DIAGNOSIS — I350 Nonrheumatic aortic (valve) stenosis: Secondary | ICD-10-CM | POA: Diagnosis not present

## 2018-09-09 NOTE — Patient Instructions (Addendum)

## 2018-09-09 NOTE — Progress Notes (Signed)
HPI Mr. Gerstman returns today for ongoing followup of syncope. He is a pleasant 68 yo man with a h/o unexplained syncope as well as RBBB, HTN, obesity and dyslipidemia. He underwent insertion of an ILR about a year ago. He was hospitalized with acute on chronic diastolic heart failure. He has been weighing himself daily now. He is trying to maintain a low sodium diet. Allergies  Allergen Reactions  . Methyldopa-Hydrochlorothiazide Other (See Comments)    GYNOCOMASTIA  . Amoxicillin     rash  . Motrin [Ibuprofen]     Chest swelling     Current Outpatient Medications  Medication Sig Dispense Refill  . atorvastatin (LIPITOR) 40 MG tablet Take 40 mg by mouth every evening.    . Cyanocobalamin (B-12) 5000 MCG CAPS Take 1 capsule by mouth every morning.    . escitalopram (LEXAPRO) 10 MG tablet Take 20 mg by mouth every evening.     . famotidine (PEPCID) 40 MG tablet Take 40 mg by mouth at bedtime.     . furosemide (LASIX) 40 MG tablet Take 1.5 tablets (60 mg total) by mouth daily. 45 tablet 0  . gabapentin (NEURONTIN) 600 MG tablet Take 600-1,200 mg by mouth 2 (two) times daily. 600mg  in the morning and 1200mg  at bedtime    . HYDROcodone-acetaminophen (NORCO/VICODIN) 5-325 MG tablet Take 1 tablet by mouth every 4 (four) hours as needed for moderate pain.    Marland Kitchen insulin NPH Human (HUMULIN N,NOVOLIN N) 100 UNIT/ML injection Inject 0.1 mLs (10 Units total) into the skin 2 (two) times daily before a meal. (Patient taking differently: Inject 40 Units into the skin 2 (two) times daily before a meal. ) 10 mL 11  . insulin regular (NOVOLIN R,HUMULIN R) 100 units/mL injection Inject 18 Units into the skin 3 (three) times daily before meals.     . metoprolol succinate (TOPROL XL) 25 MG 24 hr tablet Take 1 tablet (25 mg total) by mouth daily. 30 tablet 0  . Multiple Minerals-Vitamins (CAL-MAG-ZINC-D PO) Take 1 tablet by mouth daily.    . Multiple Vitamin (MULTIVITAMIN WITH MINERALS) TABS tablet Take  1 tablet by mouth every morning.    . pantoprazole (PROTONIX) 40 MG tablet Take 40 mg by mouth every morning.    . potassium chloride SA (K-DUR,KLOR-CON) 20 MEQ tablet Take 1 tablet (20 mEq total) by mouth daily. (Patient taking differently: Take 10 mEq by mouth daily. ) 90 tablet 3   No current facility-administered medications for this visit.      Past Medical History:  Diagnosis Date  . Aortic stenosis, moderate 07/2018   felt to be moderate at cath Jan 2020  . CAD (coronary artery disease) 07/2018   mild non obstructive  . Chronic back pain    "mainly lower right now" (08/12/2018)  . Depression   . Diabetic neuropathy (HCC)   . GERD (gastroesophageal reflux disease)   . Heart murmur   . High cholesterol   . Hypertension   . Morbid obesity with BMI of 45.0-49.9, adult (HCC)   . NICM (nonischemic cardiomyopathy) (HCC) 07/2018   EF 35-40%  . Rheumatoid arteritis (HCC)    "hands" (08/12/2018)  . Sleep apnea    "lost weight; not on CPAP anymore" (08/12/2018)  . Spinal stenosis of lumbar region    "w/slipped disc" (08/12/2018)  . Syncope    Felt to be secondary to autonomic dysfunction-s/p Loop recorder Feb 2019  . Type II diabetes mellitus (HCC)  ROS:   All systems reviewed and negative except as noted in the HPI.   Past Surgical History:  Procedure Laterality Date  . HERNIA REPAIR    . LAPAROSCOPIC GASTRIC BANDING    . LOOP RECORDER INSERTION N/A 09/01/2017   Procedure: LOOP RECORDER INSERTION;  Surgeon: Duke Salvia, MD;  [facility] INVAS[facility] CV LAB;  Service: Cardiovascular;  Laterality: N/A;  . RIGHT/LEFT HEART CATH AND CORONARY ANGIOGRAPHY N/A 08/14/2018   Procedure: RIGHT/LEFT HEART CATH AND CORONARY ANGIOGRAPHY;  Surgeon: Yvonne Kendall, MD;  Location: [facility] INVASIVE CV LAB;  Service: Cardiovascular;  Laterality: N/A;  . SHOULDER ARTHROSCOPY Right   . TONSILLECTOMY    . UMBILICAL HERNIA REPAIR       Family History  Problem Relation Age of Onset  . Cancer  Mother   . Diabetes Mother   . Parkinson's disease Father   . Heart failure Father      Social History   Socioeconomic History  . Marital status: Married    Spouse name: Not on file  . Number of children: Not on file  . Years of education: Not on file  . Highest education level: Not on file  Occupational History  . Not on file  Social Needs  . Financial resource strain: Not on file  . Food insecurity:    Worry: Not on file    Inability: Not on file  . Transportation needs:    Medical: Not on file    Non-medical: Not on file  Tobacco Use  . Smoking status: Never Smoker  . Smokeless tobacco: Never Used  Substance and Sexual Activity  . Alcohol use: No    Frequency: Never  . Drug use: Never  . Sexual activity: Not Currently  Lifestyle  . Physical activity:    Days per week: Not on file    Minutes per session: Not on file  . Stress: Not on file  Relationships  . Social connections:    Talks on phone: Not on file    Gets together: Not on file    Attends religious service: Not on file    Active member of club or organization: Not on file    Attends meetings of clubs or organizations: Not on file    Relationship status: Not on file  . Intimate partner violence:    Fear of current or ex partner: Not on file    Emotionally abused: Not on file    Physically abused: Not on file    Forced sexual activity: Not on file  Other Topics Concern  . Not on file  Social History Narrative  . Not on file     BP 119/68 (BP Location: Right Arm)   Pulse 79   Ht 5\' 11"  (1.803 m)   Wt (!) 350 lb (158.8 kg)   SpO2 94%   BMI 48.82 kg/m   Physical Exam:  Well appearing NAD HEENT: Unremarkable Neck:  No JVD, no thyromegally Lymphatics:  No adenopathy Back:  No CVA tenderness Lungs:  Clear with no wheezes HEART:  Regular rate rhythm, 2/6 systolic murmur, no rubs, no clicks Abd:  soft, positive bowel sounds, no organomegally, no rebound, no guarding Ext:  2 plus pulses, no  edema, no cyanosis, no clubbing Skin:  No rashes no nodules Neuro:  CN II through XII intact, motor grossly intact  DEVICE  Normal device function.  See PaceArt for details.   Assess/Plan: 1. Syncope - the etiology is unclear. He has some autonomic dysfunction.  2. AS - his has moderate AS. He will need an echo every year or two. 3. RBBB - he will undergo watchful waiting. No evidence yet of progression of his conduction system disease.  Leonia Reeves.D.

## 2018-09-10 DIAGNOSIS — M48061 Spinal stenosis, lumbar region without neurogenic claudication: Secondary | ICD-10-CM | POA: Diagnosis not present

## 2018-09-15 ENCOUNTER — Other Ambulatory Visit: Payer: Self-pay | Admitting: Cardiology

## 2018-09-15 DIAGNOSIS — Z79899 Other long term (current) drug therapy: Secondary | ICD-10-CM | POA: Diagnosis not present

## 2018-09-16 DIAGNOSIS — G4730 Sleep apnea, unspecified: Secondary | ICD-10-CM | POA: Diagnosis not present

## 2018-09-16 DIAGNOSIS — I10 Essential (primary) hypertension: Secondary | ICD-10-CM | POA: Diagnosis not present

## 2018-09-16 DIAGNOSIS — Z6841 Body Mass Index (BMI) 40.0 and over, adult: Secondary | ICD-10-CM | POA: Diagnosis not present

## 2018-09-16 LAB — BASIC METABOLIC PANEL
BUN/Creatinine Ratio: 14 (ref 10–24)
BUN: 11 mg/dL (ref 8–27)
CO2: 28 mmol/L (ref 20–29)
Calcium: 8.8 mg/dL (ref 8.6–10.2)
Chloride: 97 mmol/L (ref 96–106)
Creatinine, Ser: 0.78 mg/dL (ref 0.76–1.27)
GFR calc Af Amer: 110 mL/min/{1.73_m2} (ref >59)
GFR calc non Af Amer: 95 mL/min/{1.73_m2} (ref >59)
Glucose: 190 mg/dL — ABNORMAL HIGH (ref 65–99)
Potassium: 4.9 mmol/L (ref 3.5–5.2)
Sodium: 140 mmol/L (ref 134–144)

## 2018-09-23 DIAGNOSIS — E1129 Type 2 diabetes mellitus with other diabetic kidney complication: Secondary | ICD-10-CM | POA: Diagnosis not present

## 2018-09-23 DIAGNOSIS — E875 Hyperkalemia: Secondary | ICD-10-CM | POA: Diagnosis not present

## 2018-09-23 DIAGNOSIS — I10 Essential (primary) hypertension: Secondary | ICD-10-CM | POA: Diagnosis not present

## 2018-09-23 DIAGNOSIS — Z79899 Other long term (current) drug therapy: Secondary | ICD-10-CM | POA: Diagnosis not present

## 2018-09-29 ENCOUNTER — Ambulatory Visit (INDEPENDENT_AMBULATORY_CARE_PROVIDER_SITE_OTHER): Payer: Medicare Other | Admitting: *Deleted

## 2018-09-29 DIAGNOSIS — I639 Cerebral infarction, unspecified: Secondary | ICD-10-CM

## 2018-09-29 DIAGNOSIS — R55 Syncope and collapse: Secondary | ICD-10-CM

## 2018-09-30 DIAGNOSIS — E1129 Type 2 diabetes mellitus with other diabetic kidney complication: Secondary | ICD-10-CM | POA: Diagnosis not present

## 2018-09-30 DIAGNOSIS — E875 Hyperkalemia: Secondary | ICD-10-CM | POA: Diagnosis not present

## 2018-09-30 DIAGNOSIS — Z6841 Body Mass Index (BMI) 40.0 and over, adult: Secondary | ICD-10-CM | POA: Diagnosis not present

## 2018-09-30 DIAGNOSIS — I5022 Chronic systolic (congestive) heart failure: Secondary | ICD-10-CM | POA: Diagnosis not present

## 2018-09-30 LAB — CUP PACEART REMOTE DEVICE CHECK
Date Time Interrogation Session: 20200304003658
Implantable Pulse Generator Implant Date: 20190205

## 2018-10-05 ENCOUNTER — Encounter: Payer: Self-pay | Admitting: Cardiology

## 2018-10-05 ENCOUNTER — Other Ambulatory Visit: Payer: Self-pay

## 2018-10-05 ENCOUNTER — Other Ambulatory Visit (HOSPITAL_COMMUNITY)
Admission: RE | Admit: 2018-10-05 | Discharge: 2018-10-05 | Disposition: A | Discharge status: Home / Self Care | Payer: Medicare Other | Source: Ambulatory Visit | Attending: Cardiology | Admitting: Cardiology

## 2018-10-05 ENCOUNTER — Ambulatory Visit (INDEPENDENT_AMBULATORY_CARE_PROVIDER_SITE_OTHER): Payer: Medicare Other | Admitting: Cardiology

## 2018-10-05 VITALS — BP 112/70 | HR 89 | Ht 69.5 in | Wt 353.0 lb

## 2018-10-05 DIAGNOSIS — Z79899 Other long term (current) drug therapy: Secondary | ICD-10-CM | POA: Diagnosis not present

## 2018-10-05 DIAGNOSIS — I2510 Atherosclerotic heart disease of native coronary artery without angina pectoris: Secondary | ICD-10-CM

## 2018-10-05 LAB — BASIC METABOLIC PANEL
Anion gap: 8 (ref 5–15)
BUN: 14 mg/dL (ref 8–23)
CO2: 32 mmol/L (ref 22–32)
Calcium: 8.9 mg/dL (ref 8.9–10.3)
Chloride: 101 mmol/L (ref 98–111)
Creatinine, Ser: 0.87 mg/dL (ref 0.61–1.24)
GFR calc Af Amer: >60 mL/min (ref >60)
GFR calc non Af Amer: >60 mL/min (ref >60)
Glucose, Bld: 79 mg/dL (ref 70–99)
Potassium: 5.2 mmol/L — ABNORMAL HIGH (ref 3.5–5.1)
Sodium: 141 mmol/L (ref 135–145)

## 2018-10-05 MED ORDER — SACUBITRIL-VALSARTAN 24-26 MG PO TABS: 1.0000 | ORAL_TABLET | Freq: Two times a day (BID) | ORAL | 11 refills | Status: DC

## 2018-10-05 NOTE — Addendum Note (Signed)
Addended by: Kerney Elbe on: 10/05/2018 03:43 PM   Modules accepted: Orders

## 2018-10-05 NOTE — Progress Notes (Signed)
10/05/2018 Daniel Valenzuela   1951/05/11  [id number]  Primary Physician Carylon Perches, MD Primary Cardiologist: Roosvelt Maser)- EP Dr Ladona Ridgel  HPI:  Daniel Valenzuela is a pleasant 68 year old morbidly obese male from [place] who is here in the office today with his wife for follow-up from 08/31/2018 OV.  The patient has a history of syncope.  In February 2019 he had a loop recorder implanted.  He had recurrent syncope in March 2019 and the loop interrogation showed no arrhythmia.  Dr. Ladona Ridgel feels that his syncope is secondary to autonomic dysfunction.  The patient has morbid obesity with a BMI of over 50.  He has documented sleep apnea in the past but has not been on CPAP "for years".  He has diabetes, hypertension, and dyslipidemia as well.  He has chronic back pain and is followed in [place].  He presented to the hospital August 11, 2018 with acute combined CHF.  Echocardiogram revealed new LV dysfunction with an EF of 35 to 40%.  Study was limited.  The echocardiogram also indicated the patient probably had at least moderate, possibly severe AS with a mean gradient of 28 mmHg and an aortic valve area of 1.34 cm.  The patient underwent right and left heart catheterization August 14, 2018.  This revealed mild nonobstructive coronary disease.  There were prominent RA V waves suggesting significant TR.  By catheterization he had moderate aortic stenosis.  Plan is for medical therapy.    When the patient was admitted 08/10/2018 he was 408 pounds.  By discharge 08/18/2018 he had gotten down to 368 pounds.  His weight today is 353 pounds.  Since we saw him last he has been followed by Dr. Ouida Sills.  Labs have indicated high potassium levels and his potassium supplement has been stopped.  His last potassium from September 23, 2018 was 5.4.  His BUN 15, creatinine 0.75.  I believe his potassium was stopped after that blood test.  From a heart failure standpoint he is doing well.  He denies lower  extremity edema.  He wants to get back to the gym and start doing some light weight lifting to try and strengthen his back.   Current Outpatient Medications  Medication Sig Dispense Refill  . atorvastatin (LIPITOR) 40 MG tablet Take 40 mg by mouth every evening.    . Cyanocobalamin (B-12) 5000 MCG CAPS Take 1 capsule by mouth every morning.    . escitalopram (LEXAPRO) 10 MG tablet Take 20 mg by mouth every evening.     . famotidine (PEPCID) 40 MG tablet Take 40 mg by mouth at bedtime.     . furosemide (LASIX) 40 MG tablet Take 1.5 tablets (60 mg total) by mouth daily. 45 tablet 0  . gabapentin (NEURONTIN) 600 MG tablet Take 600-1,200 mg by mouth 2 (two) times daily. 600mg  in the morning and 1200mg  at bedtime    . HYDROcodone-acetaminophen (NORCO/VICODIN) 5-325 MG tablet Take 1 tablet by mouth every 4 (four) hours as needed for moderate pain.    Marland Kitchen insulin NPH Human (HUMULIN N,NOVOLIN N) 100 UNIT/ML injection Inject 0.1 mLs (10 Units total) into the skin 2 (two) times daily before a meal. (Patient taking differently: Inject 40 Units into the skin 2 (two) times daily before a meal. ) 10 mL 11  . insulin regular (NOVOLIN R,HUMULIN R) 100 units/mL injection Inject 18 Units into the skin 3 (three) times daily before meals.     . metoprolol succinate (TOPROL XL) 25 MG  24 hr tablet Take 1 tablet (25 mg total) by mouth daily. 30 tablet 0  . Multiple Minerals-Vitamins (CAL-MAG-ZINC-D PO) Take 1 tablet by mouth daily.    . Multiple Vitamin (MULTIVITAMIN WITH MINERALS) TABS tablet Take 1 tablet by mouth every morning.    . pantoprazole (PROTONIX) 40 MG tablet Take 40 mg by mouth every morning.     No current facility-administered medications for this visit.     Allergies  Allergen Reactions  . Methyldopa-Hydrochlorothiazide Other (See Comments)    GYNOCOMASTIA  . Amoxicillin     rash  . Motrin [Ibuprofen]     Chest swelling    Past Medical History:  Diagnosis Date  . Aortic stenosis, moderate  07/2018   felt to be moderate at cath Jan 2020  . CAD (coronary artery disease) 07/2018   mild non obstructive  . Chronic back pain    "mainly lower right now" (08/12/2018)  . Depression   . Diabetic neuropathy (HCC)   . GERD (gastroesophageal reflux disease)   . Heart murmur   . High cholesterol   . Hypertension   . Morbid obesity with BMI of 45.0-49.9, adult (HCC)   . NICM (nonischemic cardiomyopathy) (HCC) 07/2018   EF 35-40%  . Rheumatoid arteritis (HCC)    "hands" (08/12/2018)  . Sleep apnea    "lost weight; not on CPAP anymore" (08/12/2018)  . Spinal stenosis of lumbar region    "w/slipped disc" (08/12/2018)  . Syncope    Felt to be secondary to autonomic dysfunction-s/p Loop recorder Feb 2019  . Type II diabetes mellitus (HCC)     Social History   Socioeconomic History  . Marital status: Married    Spouse name: Not on file  . Number of children: Not on file  . Years of education: Not on file  . Highest education level: Not on file  Occupational History  . Not on file  Social Needs  . Financial resource strain: Not on file  . Food insecurity:    Worry: Not on file    Inability: Not on file  . Transportation needs:    Medical: Not on file    Non-medical: Not on file  Tobacco Use  . Smoking status: Never Smoker  . Smokeless tobacco: Never Used  Substance and Sexual Activity  . Alcohol use: No    Frequency: Never  . Drug use: Never  . Sexual activity: Not Currently  Lifestyle  . Physical activity:    Days per week: Not on file    Minutes per session: Not on file  . Stress: Not on file  Relationships  . Social connections:    Talks on phone: Not on file    Gets together: Not on file    Attends religious service: Not on file    Active member of club or organization: Not on file    Attends meetings of clubs or organizations: Not on file    Relationship status: Not on file  . Intimate partner violence:    Fear of current or ex partner: Not on file     Emotionally abused: Not on file    Physically abused: Not on file    Forced sexual activity: Not on file  Other Topics Concern  . Not on file  Social History Narrative  . Not on file     Family History  Problem Relation Age of Onset  . Cancer Mother   . Diabetes Mother   . Parkinson's disease Father   .  Heart failure Father      Review of Systems: General: negative for chills, fever, night sweats or weight changes.  Cardiovascular: negative for chest pain, dyspnea on exertion, edema, orthopnea, palpitations, paroxysmal nocturnal dyspnea or shortness of breath Dermatological: negative for rash Respiratory: negative for cough or wheezing Urologic: negative for hematuria Abdominal: negative for nausea, vomiting, diarrhea, bright red blood per rectum, melena, or hematemesis Neurologic: negative for visual changes, syncope, or dizziness All other systems reviewed and are otherwise negative except as noted above.    Blood pressure 112/70, pulse 89, height 5' 9.5" (1.765 m), weight (!) 353 lb (160.1 kg), SpO2 97 %.  General appearance: alert, cooperative, no distress and morbidly obese Lungs: clear to auscultation bilaterally Heart: regular rate and rhythm Extremities: no edema Skin: warm and dry Neurologic: Grossly normal   ASSESSMENT AND PLAN:   Chronic combined systolic and diastolic CHF Admitted 08/11/2018 with acute CHF. Echo showed new LVF- 35-40%.  Cath mild CAD.  CHF felt to be secondary to dietary indiscretion. Weight today is down to 353 lbs.  Aortic valve stenosis Moderate to severe  (by echo) to moderate (by cath) AS   Syncope Felt to be vasovagal.  Loop implanted Feb 2019- no arrhythmia documented   Morbid obesity with BMI of 45.0-49.9, adult (HCC) BM 51  Sleep apnea Not on C-pap "for years"- repeat study has been scheduled    Hyperkalemia K+ supplement stopped by PCP   PLAN I feel the best option at this point for medical therapy would be  Entresto low-dose.  I asked the patient to have a BM P drawn today.  As long as his potassium is not high I will start him on low-dose Entresto.  We will need to see him in follow-up and again in a few weeks.  He will need a repeat BM P then on Entresto and we can schedule him for a follow-up echocardiogram at that time.  Corine Shelter PA-C 10/05/2018 3:10 PM

## 2018-10-05 NOTE — Patient Instructions (Signed)
Medication Instructions:  Your physician has recommended you make the following change in your medication:  Start Entersto 24/26 mg Two Times Daily ( DO NOT start until we call with lab results)   If you need a refill on your cardiac medications before your next appointment, please call your pharmacy.   Lab work: Your physician recommends that you return for lab work in: Today   If you have labs (blood work) drawn today and your tests are completely normal, you will receive your results only by: Marland Kitchen MyChart Message (if you have MyChart) OR . A paper copy in the mail If you have any lab test that is abnormal or we need to change your treatment, we will call you to review the results.  Testing/Procedures: NONE   Follow-Up: At [facility], you and your health needs are our priority.  As part of our continuing mission to provide you with exceptional heart care, we have created designated Provider Care Teams.  These Care Teams include your primary Cardiologist (physician) and Advanced Practice Providers (APPs -  Physician Assistants and Nurse Practitioners) who all work together to provide you with the care you need, when you need it. You will need a follow up appointment in 2 weeks.  Please call our office 2 months in advance to schedule this appointment.  You may see Lewayne Bunting, MD or one of the following Advanced Practice Providers on your designated Care Team:   Randall An, PA-C [facility]) . Jacolyn Reedy, PA-C [facility])  Any Other Special Instructions Will Be Listed Below (If Applicable). Thank you for choosing [facility]!

## 2018-10-06 NOTE — Progress Notes (Signed)
[facility]link Summary Report / Loop Recorder 

## 2018-10-11 DIAGNOSIS — G4730 Sleep apnea, unspecified: Secondary | ICD-10-CM | POA: Diagnosis not present

## 2018-10-14 LAB — CUP PACEART INCLINIC DEVICE CHECK
Date Time Interrogation Session: 20200212143627
Implantable Pulse Generator Implant Date: 20190205

## 2018-11-02 ENCOUNTER — Ambulatory Visit (INDEPENDENT_AMBULATORY_CARE_PROVIDER_SITE_OTHER): Payer: Medicare Other | Admitting: *Deleted

## 2018-11-02 ENCOUNTER — Other Ambulatory Visit: Payer: Self-pay

## 2018-11-02 DIAGNOSIS — I639 Cerebral infarction, unspecified: Secondary | ICD-10-CM | POA: Diagnosis not present

## 2018-11-02 LAB — CUP PACEART REMOTE DEVICE CHECK
Date Time Interrogation Session: 20200406013619
Implantable Pulse Generator Implant Date: 20190205

## 2018-11-03 ENCOUNTER — Encounter: Payer: PRIVATE HEALTH INSURANCE | Admitting: Internal Medicine

## 2018-11-03 ENCOUNTER — Telehealth (INDEPENDENT_AMBULATORY_CARE_PROVIDER_SITE_OTHER): Payer: Medicare Other | Admitting: Internal Medicine

## 2018-11-03 VITALS — BP 101/69 | HR 69 | Ht 71.5 in | Wt 345.0 lb

## 2018-11-03 DIAGNOSIS — R55 Syncope and collapse: Secondary | ICD-10-CM

## 2018-11-03 DIAGNOSIS — G4733 Obstructive sleep apnea (adult) (pediatric): Secondary | ICD-10-CM | POA: Diagnosis not present

## 2018-11-03 NOTE — Progress Notes (Signed)
Electrophysiology TeleHealth Note   Due to national recommendations of social distancing due to COVID 19, an audio/video telehealth visit is felt to be most appropriate for this patient at this time.  See MyChart message from today for the patient's consent to telehealth for [name].   Date:  11/03/2018   ID:  Daniel Valenzuela, DOB 1950-11-12, MRN [id number]  Location: patient's home  Provider location: [address], [place]  Evaluation Performed: Follow-up visit  PCP:  Carylon Perches, MD  Cardiologist:  Lewayne Bunting, MD  Electrophysiologist:  Dr Ladona Ridgel  Chief Complaint:  " I stopped breathing 70 times when I did that CPAP test".   History of Present Illness:    Daniel Valenzuela is a 68 y.o. male who presents via audio conferencing for a telehealth visit today. He could not do the video portion due to technical difficulties. Since last being seen in our clinic, the patient reports no major problems except he has not completed his evaluation of sleep apnea. He was given oxygen until her could perform the portion of his sleep study where he wore his CPAP to confirm a benefit.  Today, he denies symptoms of palpitations, chest pain, shortness of breath,  lower extremity edema, dizziness, presyncope, or syncope.  The patient is otherwise without complaint today.  The patient denies symptoms of fevers, chills, cough, or new SOB worrisome for COVID 19.  Past Medical History:  Diagnosis Date  . Aortic stenosis, moderate 07/2018   felt to be moderate at cath Jan 2020  . CAD (coronary artery disease) 07/2018   mild non obstructive  . Chronic back pain    "mainly lower right now" (08/12/2018)  . Depression   . Diabetic neuropathy (HCC)   . GERD (gastroesophageal reflux disease)   . Heart murmur   . High cholesterol   . Hypertension   . Morbid obesity with BMI of 45.0-49.9, adult (HCC)   . NICM (nonischemic cardiomyopathy) (HCC) 07/2018   EF 35-40%  . Rheumatoid arteritis  (HCC)    "hands" (08/12/2018)  . Sleep apnea    "lost weight; not on CPAP anymore" (08/12/2018)  . Spinal stenosis of lumbar region    "w/slipped disc" (08/12/2018)  . Syncope    Felt to be secondary to autonomic dysfunction-s/p Loop recorder Feb 2019  . Type II diabetes mellitus (HCC)     Past Surgical History:  Procedure Laterality Date  . HERNIA REPAIR    . LAPAROSCOPIC GASTRIC BANDING    . LOOP RECORDER INSERTION N/A 09/01/2017   Procedure: LOOP RECORDER INSERTION;  Surgeon: Duke Salvia, MD;  [facility] INVAS[facility] CV LAB;  Service: Cardiovascular;  Laterality: N/A;  . RIGHT/LEFT HEART CATH AND CORONARY ANGIOGRAPHY N/A 08/14/2018   Procedure: RIGHT/LEFT HEART CATH AND CORONARY ANGIOGRAPHY;  Surgeon: Yvonne Kendall, MD;  Location: [facility] INVASIVE CV LAB;  Service: Cardiovascular;  Laterality: N/A;  . SHOULDER ARTHROSCOPY Right   . TONSILLECTOMY    . UMBILICAL HERNIA REPAIR      Current Outpatient Medications  Medication Sig Dispense Refill  . atorvastatin (LIPITOR) 40 MG tablet Take 40 mg by mouth every evening.    . Cyanocobalamin (B-12) 5000 MCG CAPS Take 1 capsule by mouth every morning.    . escitalopram (LEXAPRO) 10 MG tablet Take 20 mg by mouth every evening.     . famotidine (PEPCID) 40 MG tablet Take 40 mg by mouth at bedtime.     . furosemide (LASIX) 40 MG  tablet Take 1.5 tablets (60 mg total) by mouth daily. 45 tablet 0  . gabapentin (NEURONTIN) 600 MG tablet Take 600-1,200 mg by mouth 2 (two) times daily. 600mg  in the morning and 1200mg  at bedtime    . HYDROcodone-acetaminophen (NORCO/VICODIN) 5-325 MG tablet Take 1 tablet by mouth every 4 (four) hours as needed for moderate pain.    Marland Kitchen insulin NPH Human (HUMULIN N,NOVOLIN N) 100 UNIT/ML injection Inject 0.1 mLs (10 Units total) into the skin 2 (two) times daily before a meal. (Patient taking differently: Inject 40 Units into the skin 2 (two) times daily before a meal. ) 10 mL 11  . insulin regular (NOVOLIN R,HUMULIN R) 100  units/mL injection Inject 15 Units into the skin 3 (three) times daily before meals.     . metoprolol succinate (TOPROL XL) 25 MG 24 hr tablet Take 1 tablet (25 mg total) by mouth daily. 30 tablet 0  . Multiple Minerals-Vitamins (CAL-MAG-ZINC-D PO) Take 1 tablet by mouth daily.    . Multiple Vitamin (MULTIVITAMIN WITH MINERALS) TABS tablet Take 1 tablet by mouth every morning.    . pantoprazole (PROTONIX) 40 MG tablet Take 40 mg by mouth every morning.     No current facility-administered medications for this visit.     Allergies:   Methyldopa-hydrochlorothiazide; Amoxicillin; and Motrin [ibuprofen]   Social History:  The patient  reports that he has never smoked. He has never used smokeless tobacco. He reports that he does not drink alcohol or use drugs.   Family History:  The patient's  family history includes Cancer in his mother; Diabetes in his mother; Heart failure in his father; Parkinson's disease in his father.   ROS:  Please see the history of present illness.   All other systems are personally reviewed and negative.    Exam:    Vital Signs:  BP 101/69   Pulse 69   Ht 5' 11.5" (1.816 m)   Wt (!) 345 lb (156.5 kg)   BMI 47.45 kg/m     Labs/Other Tests and Data Reviewed:    Recent Labs: 08/10/2018: ALT 19 08/15/2018: Hemoglobin 12.1; Platelets 248 08/31/2018: B Natriuretic Peptide 298.0 10/05/2018: BUN 14; Creatinine, Ser 0.87; Potassium 5.2; Sodium 141   Wt Readings from Last 3 Encounters:  11/03/18 (!) 345 lb (156.5 kg)  10/05/18 (!) 353 lb (160.1 kg)  09/09/18 (!) 350 lb (158.8 kg)     Other studies personally reviewed: Sleep study - pending  Last device remote is reviewed from [employer] PDF dated 09/30/18 which reveals normal device function, no arrhythmias   ASSESSMENT & PLAN:    1.  Syncope - he is s/p ILR and still no obvious diagnosis. He has not passed out. 2. Morbid obesity - his weight is down over 50 lbs in the past 3 months. He is encouraged to lose  weight. 3. Sleep apnea - his initial study was positive. He is pending the CPAP titration phase. 4. COVID 19 screen The patient denies symptoms of COVID 19 at this time.  The importance of social distancing was discussed today.  Follow-up:  July 2020 Next remote: next month  Current medicines are reviewed at length with the patient today.   The patient does not have concerns regarding his medicines.  The following changes were made today:  none  Labs/ tests ordered today include: none No orders of the defined types were placed in this encounter.    Patient Risk:  after full review of this patients clinical status,  I feel that they are at moderate risk at this time.  Today, I have spent 15 minutes with the patient with telehealth technology discussing the above problems .    Signed, Lewayne Bunting, MD  11/03/2018 9:20 AM     [facility] [address] [place] [address] [phone] (office) [phone] (fax)

## 2018-11-03 NOTE — Patient Instructions (Signed)
Medication Instructions:  Your physician recommends that you continue on your current medications as directed. Please refer to the Current Medication list given to you today.   Labwork: NONE  Testing/Procedures: NONE  Follow-Up: Your physician recommends that you schedule a follow-up appointment in: July with Dr. Ladona Ridgel.    Any Other Special Instructions Will Be Listed Below (If Applicable).     If you need a refill on your cardiac medications before your next appointment, please call your pharmacy.  Thank you for choosing [facility]!

## 2018-11-09 NOTE — Progress Notes (Signed)
[facility]link Summary Report / Loop Recorder 

## 2018-12-04 ENCOUNTER — Other Ambulatory Visit: Payer: Self-pay

## 2018-12-04 ENCOUNTER — Ambulatory Visit (INDEPENDENT_AMBULATORY_CARE_PROVIDER_SITE_OTHER): Payer: Medicare Other | Admitting: *Deleted

## 2018-12-04 DIAGNOSIS — R55 Syncope and collapse: Secondary | ICD-10-CM

## 2018-12-06 LAB — CUP PACEART REMOTE DEVICE CHECK
Date Time Interrogation Session: 20200509013548
Implantable Pulse Generator Implant Date: 20190205

## 2018-12-08 NOTE — Progress Notes (Signed)
[facility]link Summary Report / Loop Recorder 

## 2019-01-06 ENCOUNTER — Ambulatory Visit (INDEPENDENT_AMBULATORY_CARE_PROVIDER_SITE_OTHER): Payer: Medicare Other | Admitting: *Deleted

## 2019-01-06 DIAGNOSIS — R55 Syncope and collapse: Secondary | ICD-10-CM

## 2019-01-07 ENCOUNTER — Encounter: Payer: Self-pay | Admitting: Internal Medicine

## 2019-01-07 DIAGNOSIS — Z79899 Other long term (current) drug therapy: Secondary | ICD-10-CM | POA: Diagnosis not present

## 2019-01-07 DIAGNOSIS — I5022 Chronic systolic (congestive) heart failure: Secondary | ICD-10-CM | POA: Diagnosis not present

## 2019-01-07 DIAGNOSIS — E1129 Type 2 diabetes mellitus with other diabetic kidney complication: Secondary | ICD-10-CM | POA: Diagnosis not present

## 2019-01-07 DIAGNOSIS — E875 Hyperkalemia: Secondary | ICD-10-CM | POA: Diagnosis not present

## 2019-01-07 LAB — CUP PACEART REMOTE DEVICE CHECK
Date Time Interrogation Session: 20200611020654
Implantable Pulse Generator Implant Date: 20190205

## 2019-01-13 DIAGNOSIS — E1129 Type 2 diabetes mellitus with other diabetic kidney complication: Secondary | ICD-10-CM | POA: Diagnosis not present

## 2019-01-13 DIAGNOSIS — E785 Hyperlipidemia, unspecified: Secondary | ICD-10-CM | POA: Diagnosis not present

## 2019-01-13 DIAGNOSIS — I5022 Chronic systolic (congestive) heart failure: Secondary | ICD-10-CM | POA: Diagnosis not present

## 2019-01-15 NOTE — Progress Notes (Signed)
[facility]link Summary Report / Loop Recorder 

## 2019-01-26 DIAGNOSIS — M545 Low back pain: Secondary | ICD-10-CM | POA: Diagnosis not present

## 2019-01-26 DIAGNOSIS — M4800 Spinal stenosis, site unspecified: Secondary | ICD-10-CM | POA: Diagnosis not present

## 2019-02-01 DIAGNOSIS — Z01818 Encounter for other preprocedural examination: Secondary | ICD-10-CM | POA: Diagnosis not present

## 2019-02-01 DIAGNOSIS — Z1159 Encounter for screening for other viral diseases: Secondary | ICD-10-CM | POA: Diagnosis not present

## 2019-02-04 ENCOUNTER — Ambulatory Visit (INDEPENDENT_AMBULATORY_CARE_PROVIDER_SITE_OTHER): Payer: Medicare Other | Admitting: Internal Medicine

## 2019-02-04 ENCOUNTER — Encounter: Payer: Self-pay | Admitting: Internal Medicine

## 2019-02-04 ENCOUNTER — Other Ambulatory Visit: Payer: Self-pay

## 2019-02-04 VITALS — BP 135/77 | HR 70 | Temp 97.5°F | Ht 71.0 in | Wt 362.0 lb

## 2019-02-04 DIAGNOSIS — I2510 Atherosclerotic heart disease of native coronary artery without angina pectoris: Secondary | ICD-10-CM

## 2019-02-04 DIAGNOSIS — R55 Syncope and collapse: Secondary | ICD-10-CM

## 2019-02-04 LAB — CUP PACEART INCLINIC DEVICE CHECK
Date Time Interrogation Session: 20200709102239
Implantable Pulse Generator Implant Date: 20190205

## 2019-02-04 NOTE — Patient Instructions (Signed)
Medication Instructions: Your physician recommends that you continue on your current medications as directed. Please refer to the Current Medication list given to you today. Labwork: none  Procedures/Testing: none  Follow-Up: 1 year with Dr.Taylor  Any Additional Special Instructions Will Be Listed Below (If Applicable).     If you need a refill on your cardiac medications before your next appointment, please call your pharmacy.     Thank you for choosing [facility] !        

## 2019-02-04 NOTE — Progress Notes (Signed)
HPI Daniel Valenzuela is a very pleasant 68 year old man with a history of unexplained syncope though thought to be neurally mediated, morbid obesity, hypertension, and recent diagnosis of sleep apnea.  The patient has been stable in the interim.  He admits to dietary indiscretion.  He has not had additional syncope since his last visit.  He does have chronic swelling in his lower extremities. Allergies  Allergen Reactions  . Methyldopa-Hydrochlorothiazide Other (See Comments)    GYNOCOMASTIA  . Amoxicillin     rash  . Motrin [Ibuprofen]     Chest swelling     Current Outpatient Medications  Medication Sig Dispense Refill  . atorvastatin (LIPITOR) 40 MG tablet Take 40 mg by mouth every evening.    . Cyanocobalamin (B-12) 5000 MCG CAPS Take 1 capsule by mouth every morning.    . escitalopram (LEXAPRO) 10 MG tablet Take 20 mg by mouth every evening.     . famotidine (PEPCID) 40 MG tablet Take 40 mg by mouth at bedtime.     . furosemide (LASIX) 40 MG tablet Take 1.5 tablets (60 mg total) by mouth daily. 45 tablet 0  . gabapentin (NEURONTIN) 600 MG tablet Take 600-1,200 mg by mouth 2 (two) times daily. 600mg  in the morning and 1200mg  at bedtime    . HYDROcodone-acetaminophen (NORCO/VICODIN) 5-325 MG tablet Take 1 tablet by mouth every 4 (four) hours as needed for moderate pain.    insulin NPH Human (HUMULIN N,NOVOLIN N) 100 UNIT/ML injection Inject 0.1 mLs (10 Units total) into the skin 2 (two) times daily before a meal. (Patient taking differently: Inject 40 Units into the skin 2 (two) times daily before a meal. ) 10 mL 11  . insulin regular (NOVOLIN R,HUMULIN R) 100 units/mL injection Inject 15 Units into the skin 3 (three) times daily before meals.     . metoprolol succinate (TOPROL XL) 25 MG 24 hr tablet Take 1 tablet (25 mg total) by mouth daily. 30 tablet 0  . Multiple Minerals-Vitamins (CAL-MAG-ZINC-D PO) Take 1 tablet by mouth daily.    . Multiple Vitamin (MULTIVITAMIN WITH  MINERALS) TABS tablet Take 1 tablet by mouth every morning.    . pantoprazole (PROTONIX) 40 MG tablet Take 40 mg by mouth every morning.     No current facility-administered medications for this visit.      Past Medical History:  Diagnosis Date  . Aortic stenosis, moderate 07/2018   felt to be moderate at cath Jan 2020  . CAD (coronary artery disease) 07/2018   mild non obstructive  . Chronic back pain    "mainly lower right now" (08/12/2018)  . Depression   . Diabetic neuropathy (HCC)   . GERD (gastroesophageal reflux disease)   . Heart murmur   . High cholesterol   . Hypertension   . Morbid obesity with BMI of 45.0-49.9, adult (HCC)   . NICM (nonischemic cardiomyopathy) (HCC) 07/2018   EF 35-40%  . Rheumatoid arteritis (HCC)    "hands" (08/12/2018)  . Sleep apnea    "lost weight; not on CPAP anymore" (08/12/2018)  . Spinal stenosis of lumbar region    "w/slipped disc" (08/12/2018)  . Syncope    Felt to be secondary to autonomic dysfunction-s/p Loop recorder Feb 2019  . Type II diabetes mellitus (HCC)     ROS:   All systems reviewed and negative except as noted in the HPI.   Past Surgical History:  Procedure Laterality Date  . HERNIA REPAIR    .  LAPAROSCOPIC GASTRIC BANDING    . LOOP RECORDER INSERTION N/A 09/01/2017   Procedure: LOOP RECORDER INSERTION;  Surgeon: Deboraha Sprang, MD;  Location: [facility];  Service: Cardiovascular;  Laterality: N/A;  . RIGHT/LEFT HEART CATH AND CORONARY ANGIOGRAPHY N/A 08/14/2018   Procedure: RIGHT/LEFT HEART CATH AND CORONARY ANGIOGRAPHY;  Surgeon: Nelva Bush, MD;  Location: [facility];  Service: Cardiovascular;  Laterality: N/A;  . SHOULDER ARTHROSCOPY Right   . TONSILLECTOMY    . UMBILICAL HERNIA REPAIR       Family History  Problem Relation Age of Onset  . Cancer Mother   . Diabetes Mother   . Parkinson's disease Father   . Heart failure Father      Social History   Socioeconomic History  .  Marital status: Married    Spouse name: Not on file  . Number of children: Not on file  . Years of education: Not on file  . Highest education level: Not on file  Occupational History  . Not on file  Social Needs  . Financial resource strain: Not on file  . Food insecurity    Worry: Not on file    Inability: Not on file  . Transportation needs    Medical: Not on file    Non-medical: Not on file  Tobacco Use  . Smoking status: Never Smoker  . Smokeless tobacco: Never Used  Substance and Sexual Activity  . Alcohol use: No    Frequency: Never  . Drug use: Never  . Sexual activity: Not Currently  Lifestyle  . Physical activity    Days per week: Not on file    Minutes per session: Not on file  . Stress: Not on file  Relationships  . Social Herbalist on phone: Not on file    Gets together: Not on file    Attends religious service: Not on file    Active member of club or organization: Not on file    Attends meetings of clubs or organizations: Not on file    Relationship status: Not on file  . Intimate partner violence    Fear of current or ex partner: Not on file    Emotionally abused: Not on file    Physically abused: Not on file    Forced sexual activity: Not on file  Other Topics Concern  . Not on file  Social History Narrative  . Not on file     BP 135/77 (BP Location: Left Arm)   Pulse 70   Temp (!) 97.5 F (36.4 C)   Ht 5\' 11"  (1.803 m)   Wt (!) 362 lb (164.2 kg)   SpO2 94%   BMI 50.49 kg/m   Physical Exam:  Morbidly obese appearing NAD HEENT: Unremarkable Neck:  Unable to assess JVD, no thyromegally Lymphatics:  No adenopathy Back:  No CVA tenderness Lungs:  Clear with no wheezes HEART:  Regular rate rhythm, no murmurs, no rubs, no clicks Abd:  soft, positive bowel sounds, no organomegally, no rebound, no guarding Ext:  2 plus pulses, no edema, no cyanosis, no clubbing Skin:  No rashes no nodules Neuro:  CN II through XII intact, motor  grossly intact  DEVICE  Normal device function.  See PaceArt for details.   Assess/Plan: 1.  Syncope -despite his multiple possible etiologies, he has had no recurrent syncope since his last visit.  Interrogation of his loop recorder demonstrates no bradycardia or tachycardia episodes. 2.  Aortic  stenosis -this will be followed and I suspect he will ultimately require percutaneous aortic valve replacement in the coming months to years.  He is currently minimally dyspneic and has no anginal symptoms. 3.  Morbid obesity -the patient's BMI is 50.  I have encouraged the patient to lose weight. 4.  Left bundle branch block -the patient has had no evidence of progression of his conduction system disease.  With his loop recorder in, he will undergo watchful waiting.  Leonia Reeves.D

## 2019-02-05 DIAGNOSIS — M48061 Spinal stenosis, lumbar region without neurogenic claudication: Secondary | ICD-10-CM | POA: Diagnosis not present

## 2019-02-07 DIAGNOSIS — G4733 Obstructive sleep apnea (adult) (pediatric): Secondary | ICD-10-CM | POA: Diagnosis not present

## 2019-02-08 ENCOUNTER — Ambulatory Visit (INDEPENDENT_AMBULATORY_CARE_PROVIDER_SITE_OTHER): Payer: Medicare Other | Admitting: *Deleted

## 2019-02-08 DIAGNOSIS — I5043 Acute on chronic combined systolic (congestive) and diastolic (congestive) heart failure: Secondary | ICD-10-CM

## 2019-02-09 LAB — CUP PACEART REMOTE DEVICE CHECK
Date Time Interrogation Session: 20200714024110
Implantable Pulse Generator Implant Date: 20190205

## 2019-02-15 DIAGNOSIS — M48061 Spinal stenosis, lumbar region without neurogenic claudication: Secondary | ICD-10-CM | POA: Diagnosis not present

## 2019-02-16 NOTE — Progress Notes (Signed)
[facility]link Summary Report / Loop Recorder 

## 2019-03-14 LAB — CUP PACEART REMOTE DEVICE CHECK
Date Time Interrogation Session: 20200816023825
Implantable Pulse Generator Implant Date: 20190205

## 2019-03-15 ENCOUNTER — Ambulatory Visit (INDEPENDENT_AMBULATORY_CARE_PROVIDER_SITE_OTHER): Payer: Medicare Other | Admitting: *Deleted

## 2019-03-15 DIAGNOSIS — R55 Syncope and collapse: Secondary | ICD-10-CM | POA: Diagnosis not present

## 2019-03-17 DIAGNOSIS — M2550 Pain in unspecified joint: Secondary | ICD-10-CM | POA: Diagnosis not present

## 2019-03-17 DIAGNOSIS — E1149 Type 2 diabetes mellitus with other diabetic neurological complication: Secondary | ICD-10-CM | POA: Diagnosis not present

## 2019-03-17 DIAGNOSIS — E1140 Type 2 diabetes mellitus with diabetic neuropathy, unspecified: Secondary | ICD-10-CM | POA: Diagnosis not present

## 2019-03-17 DIAGNOSIS — R768 Other specified abnormal immunological findings in serum: Secondary | ICD-10-CM | POA: Diagnosis not present

## 2019-03-17 DIAGNOSIS — Z8679 Personal history of other diseases of the circulatory system: Secondary | ICD-10-CM | POA: Diagnosis not present

## 2019-03-17 DIAGNOSIS — I350 Nonrheumatic aortic (valve) stenosis: Secondary | ICD-10-CM | POA: Diagnosis not present

## 2019-03-17 DIAGNOSIS — M659 Synovitis and tenosynovitis, unspecified: Secondary | ICD-10-CM | POA: Diagnosis not present

## 2019-03-17 DIAGNOSIS — G5601 Carpal tunnel syndrome, right upper limb: Secondary | ICD-10-CM | POA: Diagnosis not present

## 2019-03-23 NOTE — Progress Notes (Signed)
[facility]link Summary Report / Loop Recorder 

## 2019-03-24 DIAGNOSIS — E119 Type 2 diabetes mellitus without complications: Secondary | ICD-10-CM | POA: Diagnosis not present

## 2019-03-24 DIAGNOSIS — Z794 Long term (current) use of insulin: Secondary | ICD-10-CM | POA: Diagnosis not present

## 2019-03-24 DIAGNOSIS — H2513 Age-related nuclear cataract, bilateral: Secondary | ICD-10-CM | POA: Diagnosis not present

## 2019-04-15 ENCOUNTER — Ambulatory Visit (INDEPENDENT_AMBULATORY_CARE_PROVIDER_SITE_OTHER): Payer: Medicare Other | Admitting: *Deleted

## 2019-04-15 DIAGNOSIS — R55 Syncope and collapse: Secondary | ICD-10-CM

## 2019-04-16 DIAGNOSIS — L03032 Cellulitis of left toe: Secondary | ICD-10-CM | POA: Diagnosis not present

## 2019-04-16 DIAGNOSIS — Z79899 Other long term (current) drug therapy: Secondary | ICD-10-CM | POA: Diagnosis not present

## 2019-04-16 DIAGNOSIS — M79672 Pain in left foot: Secondary | ICD-10-CM | POA: Diagnosis not present

## 2019-04-16 DIAGNOSIS — E785 Hyperlipidemia, unspecified: Secondary | ICD-10-CM | POA: Diagnosis not present

## 2019-04-16 DIAGNOSIS — E1129 Type 2 diabetes mellitus with other diabetic kidney complication: Secondary | ICD-10-CM | POA: Diagnosis not present

## 2019-04-16 DIAGNOSIS — I5022 Chronic systolic (congestive) heart failure: Secondary | ICD-10-CM | POA: Diagnosis not present

## 2019-04-16 LAB — CUP PACEART REMOTE DEVICE CHECK
Date Time Interrogation Session: 20200918024140
Implantable Pulse Generator Implant Date: 20190205

## 2019-04-19 NOTE — Progress Notes (Signed)
[facility]link Summary Report / Loop Recorder 

## 2019-04-22 DIAGNOSIS — F331 Major depressive disorder, recurrent, moderate: Secondary | ICD-10-CM | POA: Diagnosis not present

## 2019-04-22 DIAGNOSIS — M545 Low back pain: Secondary | ICD-10-CM | POA: Diagnosis not present

## 2019-04-22 DIAGNOSIS — E1129 Type 2 diabetes mellitus with other diabetic kidney complication: Secondary | ICD-10-CM | POA: Diagnosis not present

## 2019-04-22 DIAGNOSIS — I5022 Chronic systolic (congestive) heart failure: Secondary | ICD-10-CM | POA: Diagnosis not present

## 2019-04-23 DIAGNOSIS — Z23 Encounter for immunization: Secondary | ICD-10-CM | POA: Diagnosis not present

## 2019-04-26 DIAGNOSIS — Z6841 Body Mass Index (BMI) 40.0 and over, adult: Secondary | ICD-10-CM | POA: Diagnosis not present

## 2019-04-26 DIAGNOSIS — G4733 Obstructive sleep apnea (adult) (pediatric): Secondary | ICD-10-CM | POA: Diagnosis not present

## 2019-04-26 DIAGNOSIS — I10 Essential (primary) hypertension: Secondary | ICD-10-CM | POA: Diagnosis not present

## 2019-05-18 ENCOUNTER — Ambulatory Visit (INDEPENDENT_AMBULATORY_CARE_PROVIDER_SITE_OTHER): Payer: Medicare Other | Admitting: *Deleted

## 2019-05-18 DIAGNOSIS — R55 Syncope and collapse: Secondary | ICD-10-CM

## 2019-05-19 LAB — CUP PACEART REMOTE DEVICE CHECK
Date Time Interrogation Session: 20201021024131
Implantable Pulse Generator Implant Date: 20190205

## 2019-05-25 DIAGNOSIS — M545 Low back pain: Secondary | ICD-10-CM | POA: Diagnosis not present

## 2019-05-25 DIAGNOSIS — F339 Major depressive disorder, recurrent, unspecified: Secondary | ICD-10-CM | POA: Diagnosis not present

## 2019-06-02 NOTE — Progress Notes (Signed)
[facility]link Summary Report / Loop Recorder 

## 2019-06-03 ENCOUNTER — Telehealth: Payer: Self-pay | Admitting: Internal Medicine

## 2019-06-03 NOTE — Telephone Encounter (Signed)
Pt's wife called stating that pt is having a lot of swelling in his hands, feet, legs, ankles & stomach. His weight is currently 400 lbs. He is not able to get up and move a lot as his back is messed up. He is taking lasix 60 mg daily. Pt wanted to be seen. Kerin Ransom has an opening tomorrow @ 5.

## 2019-06-03 NOTE — Telephone Encounter (Signed)
Per phone call from pt's wife -- he's retaining fluid again and having problems getting his breath  Please call [phone]

## 2019-06-04 ENCOUNTER — Inpatient Hospital Stay (HOSPITAL_COMMUNITY)
Admission: EM | Admit: 2019-06-04 | Discharge: 2019-06-13 | DRG: 292 | Disposition: A | Discharge status: Home / Self Care | Payer: Medicare Other | Attending: Family Medicine | Admitting: Family Medicine

## 2019-06-04 ENCOUNTER — Emergency Department (HOSPITAL_COMMUNITY): Payer: Medicare Other

## 2019-06-04 ENCOUNTER — Other Ambulatory Visit: Payer: Self-pay

## 2019-06-04 ENCOUNTER — Ambulatory Visit (INDEPENDENT_AMBULATORY_CARE_PROVIDER_SITE_OTHER): Payer: Medicare Other | Admitting: Cardiology

## 2019-06-04 ENCOUNTER — Telehealth: Payer: Self-pay | Admitting: Cardiology

## 2019-06-04 ENCOUNTER — Encounter: Payer: Self-pay | Admitting: Cardiology

## 2019-06-04 ENCOUNTER — Encounter (HOSPITAL_COMMUNITY): Payer: Self-pay | Admitting: *Deleted

## 2019-06-04 VITALS — BP 132/74 | HR 87 | Temp 97.1°F | Ht 71.0 in | Wt >= 6400 oz

## 2019-06-04 DIAGNOSIS — G4733 Obstructive sleep apnea (adult) (pediatric): Secondary | ICD-10-CM

## 2019-06-04 DIAGNOSIS — R300 Dysuria: Secondary | ICD-10-CM | POA: Diagnosis not present

## 2019-06-04 DIAGNOSIS — I447 Left bundle-branch block, unspecified: Secondary | ICD-10-CM | POA: Diagnosis present

## 2019-06-04 DIAGNOSIS — Z8249 Family history of ischemic heart disease and other diseases of the circulatory system: Secondary | ICD-10-CM

## 2019-06-04 DIAGNOSIS — K0530 Chronic periodontitis, unspecified: Secondary | ICD-10-CM | POA: Diagnosis present

## 2019-06-04 DIAGNOSIS — I428 Other cardiomyopathies: Secondary | ICD-10-CM | POA: Diagnosis present

## 2019-06-04 DIAGNOSIS — E1140 Type 2 diabetes mellitus with diabetic neuropathy, unspecified: Secondary | ICD-10-CM | POA: Diagnosis present

## 2019-06-04 DIAGNOSIS — I5043 Acute on chronic combined systolic (congestive) and diastolic (congestive) heart failure: Secondary | ICD-10-CM

## 2019-06-04 DIAGNOSIS — I5041 Acute combined systolic (congestive) and diastolic (congestive) heart failure: Secondary | ICD-10-CM | POA: Diagnosis present

## 2019-06-04 DIAGNOSIS — I350 Nonrheumatic aortic (valve) stenosis: Secondary | ICD-10-CM

## 2019-06-04 DIAGNOSIS — I361 Nonrheumatic tricuspid (valve) insufficiency: Secondary | ICD-10-CM | POA: Diagnosis not present

## 2019-06-04 DIAGNOSIS — R7989 Other specified abnormal findings of blood chemistry: Secondary | ICD-10-CM | POA: Diagnosis present

## 2019-06-04 DIAGNOSIS — R339 Retention of urine, unspecified: Secondary | ICD-10-CM | POA: Diagnosis not present

## 2019-06-04 DIAGNOSIS — K029 Dental caries, unspecified: Secondary | ICD-10-CM | POA: Diagnosis present

## 2019-06-04 DIAGNOSIS — F329 Major depressive disorder, single episode, unspecified: Secondary | ICD-10-CM | POA: Diagnosis present

## 2019-06-04 DIAGNOSIS — Z833 Family history of diabetes mellitus: Secondary | ICD-10-CM

## 2019-06-04 DIAGNOSIS — M545 Low back pain: Secondary | ICD-10-CM

## 2019-06-04 DIAGNOSIS — Z794 Long term (current) use of insulin: Secondary | ICD-10-CM | POA: Diagnosis not present

## 2019-06-04 DIAGNOSIS — Z01810 Encounter for preprocedural cardiovascular examination: Secondary | ICD-10-CM | POA: Diagnosis not present

## 2019-06-04 DIAGNOSIS — I2510 Atherosclerotic heart disease of native coronary artery without angina pectoris: Secondary | ICD-10-CM | POA: Diagnosis present

## 2019-06-04 DIAGNOSIS — I110 Hypertensive heart disease with heart failure: Secondary | ICD-10-CM | POA: Diagnosis present

## 2019-06-04 DIAGNOSIS — E118 Type 2 diabetes mellitus with unspecified complications: Secondary | ICD-10-CM

## 2019-06-04 DIAGNOSIS — G8929 Other chronic pain: Secondary | ICD-10-CM

## 2019-06-04 DIAGNOSIS — K219 Gastro-esophageal reflux disease without esophagitis: Secondary | ICD-10-CM | POA: Diagnosis present

## 2019-06-04 DIAGNOSIS — R0602 Shortness of breath: Secondary | ICD-10-CM | POA: Diagnosis not present

## 2019-06-04 DIAGNOSIS — E875 Hyperkalemia: Secondary | ICD-10-CM | POA: Diagnosis present

## 2019-06-04 DIAGNOSIS — K089 Disorder of teeth and supporting structures, unspecified: Secondary | ICD-10-CM | POA: Diagnosis not present

## 2019-06-04 DIAGNOSIS — Z881 Allergy status to other antibiotic agents status: Secondary | ICD-10-CM

## 2019-06-04 DIAGNOSIS — Z886 Allergy status to analgesic agent status: Secondary | ICD-10-CM

## 2019-06-04 DIAGNOSIS — Z6841 Body Mass Index (BMI) 40.0 and over, adult: Secondary | ICD-10-CM

## 2019-06-04 DIAGNOSIS — Z9884 Bariatric surgery status: Secondary | ICD-10-CM

## 2019-06-04 DIAGNOSIS — I509 Heart failure, unspecified: Secondary | ICD-10-CM | POA: Diagnosis not present

## 2019-06-04 DIAGNOSIS — E785 Hyperlipidemia, unspecified: Secondary | ICD-10-CM | POA: Diagnosis present

## 2019-06-04 DIAGNOSIS — E8770 Fluid overload, unspecified: Secondary | ICD-10-CM | POA: Diagnosis not present

## 2019-06-04 DIAGNOSIS — Z79899 Other long term (current) drug therapy: Secondary | ICD-10-CM

## 2019-06-04 DIAGNOSIS — Z888 Allergy status to other drugs, medicaments and biological substances status: Secondary | ICD-10-CM

## 2019-06-04 DIAGNOSIS — I959 Hypotension, unspecified: Secondary | ICD-10-CM | POA: Diagnosis not present

## 2019-06-04 DIAGNOSIS — Z20828 Contact with and (suspected) exposure to other viral communicable diseases: Secondary | ICD-10-CM | POA: Diagnosis present

## 2019-06-04 DIAGNOSIS — W19XXXA Unspecified fall, initial encounter: Secondary | ICD-10-CM

## 2019-06-04 DIAGNOSIS — R55 Syncope and collapse: Secondary | ICD-10-CM

## 2019-06-04 DIAGNOSIS — K0889 Other specified disorders of teeth and supporting structures: Secondary | ICD-10-CM

## 2019-06-04 DIAGNOSIS — I5023 Acute on chronic systolic (congestive) heart failure: Secondary | ICD-10-CM | POA: Diagnosis not present

## 2019-06-04 DIAGNOSIS — I440 Atrioventricular block, first degree: Secondary | ICD-10-CM | POA: Diagnosis present

## 2019-06-04 DIAGNOSIS — E7800 Pure hypercholesterolemia, unspecified: Secondary | ICD-10-CM | POA: Diagnosis present

## 2019-06-04 HISTORY — DX: Unspecified right bundle-branch block: I45.10

## 2019-06-04 HISTORY — DX: Chronic combined systolic (congestive) and diastolic (congestive) heart failure: I50.42

## 2019-06-04 LAB — BASIC METABOLIC PANEL
Anion gap: 12 (ref 5–15)
BUN: 8 mg/dL (ref 8–23)
CO2: 28 mmol/L (ref 22–32)
Calcium: 8.9 mg/dL (ref 8.9–10.3)
Chloride: 102 mmol/L (ref 98–111)
Creatinine, Ser: 0.79 mg/dL (ref 0.61–1.24)
GFR calc Af Amer: >60 mL/min (ref >60)
GFR calc non Af Amer: >60 mL/min (ref >60)
Glucose, Bld: 78 mg/dL (ref 70–99)
Potassium: 4.3 mmol/L (ref 3.5–5.1)
Sodium: 142 mmol/L (ref 135–145)

## 2019-06-04 LAB — HIV ANTIBODY (ROUTINE TESTING W REFLEX): HIV Screen 4th Generation wRfx: NONREACTIVE

## 2019-06-04 LAB — TROPONIN I (HIGH SENSITIVITY)
Troponin I (High Sensitivity): 109 ng/L (ref <18)
Troponin I (High Sensitivity): 110 ng/L (ref <18)

## 2019-06-04 LAB — CBC
HCT: 40.1 % (ref 39.0–52.0)
HCT: 43.1 % (ref 39.0–52.0)
Hemoglobin: 12.5 g/dL — ABNORMAL LOW (ref 13.0–17.0)
Hemoglobin: 13.5 g/dL (ref 13.0–17.0)
MCH: 28.7 pg (ref 26.0–34.0)
MCH: 28.7 pg (ref 26.0–34.0)
MCHC: 31.2 g/dL (ref 30.0–36.0)
MCHC: 31.3 g/dL (ref 30.0–36.0)
MCV: 92.2 fL (ref 80.0–100.0)
MCV: 91.5 fL (ref 80.0–100.0)
Platelets: 262 10*3/uL (ref 150–400)
Platelets: 300 10*3/uL (ref 150–400)
RBC: 4.35 MIL/uL (ref 4.22–5.81)
RBC: 4.71 MIL/uL (ref 4.22–5.81)
RDW: 15 % (ref 11.5–15.5)
RDW: 15.3 % (ref 11.5–15.5)
WBC: 9 10*3/uL (ref 4.0–10.5)
WBC: 10.6 10*3/uL — ABNORMAL HIGH (ref 4.0–10.5)
nRBC: 0 % (ref 0.0–0.2)
nRBC: 0 % (ref 0.0–0.2)

## 2019-06-04 LAB — HEMOGLOBIN A1C
Hgb A1c MFr Bld: 8.4 % — ABNORMAL HIGH (ref 4.8–5.6)
Mean Plasma Glucose: 194.38 mg/dL

## 2019-06-04 LAB — SARS CORONAVIRUS 2 (TAT 6-24 HRS): SARS Coronavirus 2: NEGATIVE

## 2019-06-04 LAB — BRAIN NATRIURETIC PEPTIDE: B Natriuretic Peptide: 502.7 pg/mL — ABNORMAL HIGH (ref 0.0–100.0)

## 2019-06-04 LAB — CREATININE, SERUM
Creatinine, Ser: 0.82 mg/dL (ref 0.61–1.24)
GFR calc Af Amer: >60 mL/min (ref >60)
GFR calc non Af Amer: >60 mL/min (ref >60)

## 2019-06-04 LAB — CBG MONITORING, ED: Glucose-Capillary: 89 mg/dL (ref 70–99)

## 2019-06-04 MED ORDER — FUROSEMIDE 10 MG/ML IJ SOLN: 60.0000 mg | Freq: Two times a day (BID) | INTRAMUSCULAR | Status: DC

## 2019-06-04 MED ORDER — PANTOPRAZOLE SODIUM 40 MG PO TBEC
40.0000 mg | DELAYED_RELEASE_TABLET | Freq: Every morning | ORAL | Status: DC
  Administered 2019-06-05 – 2019-06-07 (×3): 40 mg via ORAL
  Filled 2019-06-04 (×3): qty 1

## 2019-06-04 MED ORDER — ENOXAPARIN SODIUM 100 MG/ML ~~LOC~~ SOLN
90.0000 mg | SUBCUTANEOUS | Status: DC
  Administered 2019-06-04 – 2019-06-10 (×6): 90 mg via SUBCUTANEOUS
  Filled 2019-06-04 (×6): qty 1
  Filled 2019-06-04: qty 0.9

## 2019-06-04 MED ORDER — LISINOPRIL 10 MG PO TABS
10.0000 mg | ORAL_TABLET | Freq: Every day | ORAL | Status: DC
  Administered 2019-06-05 – 2019-06-12 (×8): 10 mg via ORAL
  Filled 2019-06-04 (×9): qty 1

## 2019-06-04 MED ORDER — INSULIN ASPART 100 UNIT/ML ~~LOC~~ SOLN: 0.0000 [IU] | Freq: Every day | SUBCUTANEOUS | Status: DC

## 2019-06-04 MED ORDER — BUPROPION HCL ER (XL) 150 MG PO TB24
300.0000 mg | ORAL_TABLET | Freq: Every day | ORAL | Status: DC
  Administered 2019-06-04 – 2019-06-13 (×10): 300 mg via ORAL
  Filled 2019-06-04 (×10): qty 2

## 2019-06-04 MED ORDER — INSULIN ASPART 100 UNIT/ML ~~LOC~~ SOLN
15.0000 [IU] | Freq: Three times a day (TID) | SUBCUTANEOUS | Status: DC
  Administered 2019-06-05 – 2019-06-11 (×19): 15 [IU] via SUBCUTANEOUS

## 2019-06-04 MED ORDER — FUROSEMIDE 10 MG/ML IJ SOLN
80.0000 mg | Freq: Once | INTRAMUSCULAR | Status: AC
  Administered 2019-06-04: 80 mg via INTRAVENOUS
  Filled 2019-06-04: qty 8

## 2019-06-04 MED ORDER — HYDROCODONE-ACETAMINOPHEN 5-325 MG PO TABS
1.0000 | ORAL_TABLET | ORAL | Status: DC | PRN
  Administered 2019-06-10 – 2019-06-12 (×5): 1 via ORAL
  Filled 2019-06-04 (×5): qty 1

## 2019-06-04 MED ORDER — ATORVASTATIN CALCIUM 40 MG PO TABS
40.0000 mg | ORAL_TABLET | Freq: Every evening | ORAL | Status: DC
  Administered 2019-06-04: 21:00:00 40 mg via ORAL
  Filled 2019-06-04: qty 1

## 2019-06-04 MED ORDER — GABAPENTIN 600 MG PO TABS
600.0000 mg | ORAL_TABLET | Freq: Every day | ORAL | Status: DC
  Administered 2019-06-05 – 2019-06-13 (×9): 600 mg via ORAL
  Filled 2019-06-04 (×10): qty 1

## 2019-06-04 MED ORDER — ASPIRIN 81 MG PO CHEW
324.0000 mg | CHEWABLE_TABLET | Freq: Once | ORAL | Status: AC
  Administered 2019-06-04: 17:00:00 324 mg via ORAL
  Filled 2019-06-04: qty 4

## 2019-06-04 MED ORDER — SODIUM CHLORIDE 0.9% FLUSH
3.0000 mL | Freq: Once | INTRAVENOUS | Status: AC
  Administered 2019-06-04: 17:00:00 3 mL via INTRAVENOUS

## 2019-06-04 MED ORDER — ESCITALOPRAM OXALATE 10 MG PO TABS
20.0000 mg | ORAL_TABLET | Freq: Every evening | ORAL | Status: DC
  Administered 2019-06-04 – 2019-06-12 (×9): 20 mg via ORAL
  Filled 2019-06-04 (×9): qty 2

## 2019-06-04 MED ORDER — INSULIN ASPART 100 UNIT/ML ~~LOC~~ SOLN
0.0000 [IU] | Freq: Three times a day (TID) | SUBCUTANEOUS | Status: DC
  Administered 2019-06-05: 5 [IU] via SUBCUTANEOUS
  Administered 2019-06-05 (×2): 3 [IU] via SUBCUTANEOUS
  Administered 2019-06-06: 2 [IU] via SUBCUTANEOUS
  Administered 2019-06-06: 8 [IU] via SUBCUTANEOUS
  Administered 2019-06-06 – 2019-06-07 (×2): 2 [IU] via SUBCUTANEOUS
  Administered 2019-06-07: 3 [IU] via SUBCUTANEOUS
  Administered 2019-06-08: 17:00:00 2 [IU] via SUBCUTANEOUS
  Administered 2019-06-08: 3 [IU] via SUBCUTANEOUS
  Administered 2019-06-08: 2 [IU] via SUBCUTANEOUS
  Administered 2019-06-09: 12:00:00 5 [IU] via SUBCUTANEOUS
  Administered 2019-06-09: 07:00:00 2 [IU] via SUBCUTANEOUS
  Administered 2019-06-10: 3 [IU] via SUBCUTANEOUS
  Administered 2019-06-10 – 2019-06-11 (×2): 5 [IU] via SUBCUTANEOUS
  Administered 2019-06-11: 10:00:00 2 [IU] via SUBCUTANEOUS
  Administered 2019-06-12: 5 [IU] via SUBCUTANEOUS
  Administered 2019-06-12: 09:00:00 3 [IU] via SUBCUTANEOUS
  Administered 2019-06-13: 2 [IU] via SUBCUTANEOUS
  Administered 2019-06-13: 13:00:00 11 [IU] via SUBCUTANEOUS

## 2019-06-04 MED ORDER — GABAPENTIN 600 MG PO TABS
1200.0000 mg | ORAL_TABLET | Freq: Every day | ORAL | Status: DC
  Administered 2019-06-04 – 2019-06-12 (×9): 1200 mg via ORAL
  Filled 2019-06-04 (×9): qty 2

## 2019-06-04 MED ORDER — INSULIN NPH (HUMAN) (ISOPHANE) 100 UNIT/ML ~~LOC~~ SUSP
30.0000 [IU] | Freq: Two times a day (BID) | SUBCUTANEOUS | Status: DC
  Administered 2019-06-05 – 2019-06-08 (×4): 30 [IU] via SUBCUTANEOUS
  Administered 2019-06-09: 22:00:00 20 [IU] via SUBCUTANEOUS
  Administered 2019-06-09 – 2019-06-11 (×3): 30 [IU] via SUBCUTANEOUS
  Filled 2019-06-04: qty 10

## 2019-06-04 MED ORDER — FUROSEMIDE 10 MG/ML IJ SOLN
60.0000 mg | Freq: Two times a day (BID) | INTRAMUSCULAR | Status: DC
  Administered 2019-06-04 – 2019-06-06 (×4): 60 mg via INTRAVENOUS
  Filled 2019-06-04 (×4): qty 6

## 2019-06-04 NOTE — Assessment & Plan Note (Signed)
Admitted 08/11/2018 with acute CHF. Echo showed new LVF- 35-40%.  Cath mild CAD.  CHF felt to be secondary to dietary indiscretion.  Pt now presents with increased edema, SOB, and 40 lb weight gain since July.

## 2019-06-04 NOTE — Assessment & Plan Note (Signed)
Moderate to severe by echo-felt to be moderate at cath Jan 2020

## 2019-06-04 NOTE — ED Triage Notes (Signed)
Pt sent here by his cardiologist for CHF exacerbation.  States he went to see him for increased sob.

## 2019-06-04 NOTE — H&P (Signed)
History and Physical    [name] HGD:[id number] DOB: [birth year] DOA: 06/04/2019  PCP: Carylon Perches, MD  Patient coming from: Home  Chief Complaint: SOB  HPI: Daniel Valenzuela is a 68 y.o. male with medical history significant of DM, combined diastolic and systolic chf with last EF of  96-22% in 1/20, HTN, HLD who presents to ED with complaints of gradual sob and worsening edema over past several weeks prior to hospital visit. Pt claims roughly 40 lb wt gain over past several months prior to admit. Pt had noted no improvement with usual home dose of lasix and also no improvement despite increasing to 80mg . Pt subsequently presented to ED for further work up  On further questioning, pt states he is compliant with a low sodium diet and is "used to a bland diet now." Pt does, however, admit to drinking ample amounts of unsweet tea with sweetener and coffee, admits that he may be drinking more than he should.  ED Course: In ED, pt noted to have BNP of 502, trop of 110, Cr of 0.79. Pt was afebrile and on room air. CXR notable for cardiomegaly with findings consistent with CHF exacerbation. Pt given trial of 80mg  IV lasix with good urine output and symptomatic improvement noted. Hospitalist consulted for consideration for medical admission.  Review of Systems:  Review of Systems  Constitutional: Negative for chills, fever and weight loss.  HENT: Negative for congestion, ear pain and tinnitus.   Eyes: Negative for double vision, photophobia and pain.  Respiratory: Positive for shortness of breath. Negative for hemoptysis and sputum production.   Cardiovascular: Positive for leg swelling. Negative for chest pain, palpitations and orthopnea.  Gastrointestinal: Negative for abdominal pain, nausea and vomiting.  Genitourinary: Negative for frequency, hematuria and urgency.  Musculoskeletal: Negative for back pain, falls and neck pain.  Neurological: Negative for tingling, tremors, focal weakness,  seizures and loss of consciousness.  Psychiatric/Behavioral: Negative for hallucinations and memory loss. The patient is not nervous/anxious.     Past Medical History:  Diagnosis Date  . Aortic stenosis, moderate 07/2018   felt to be moderate at cath Jan 2020  . CAD (coronary artery disease) 07/2018   mild non obstructive  . CHF (congestive heart failure) (HCC)   . Chronic back pain    "mainly lower right now" (08/12/2018)  . Depression   . Diabetic neuropathy (HCC)   . GERD (gastroesophageal reflux disease)   . Heart murmur   . High cholesterol   . Hypertension   . Morbid obesity with BMI of 45.0-49.9, adult (HCC)   . NICM (nonischemic cardiomyopathy) (HCC) 07/2018   EF 35-40%  . Rheumatoid arteritis (HCC)    "hands" (08/12/2018)  . Sleep apnea    "lost weight; not on CPAP anymore" (08/12/2018)  . Spinal stenosis of lumbar region    "w/slipped disc" (08/12/2018)  . Syncope    Felt to be secondary to autonomic dysfunction-s/p Loop recorder Feb 2019  . Type II diabetes mellitus (HCC)     Past Surgical History:  Procedure Laterality Date  . HERNIA REPAIR    . LAPAROSCOPIC GASTRIC BANDING    . LOOP RECORDER INSERTION N/A 09/01/2017   Procedure: LOOP RECORDER INSERTION;  Surgeon: Duke Salvia, MD;  [facility]: [facility];  Service: Cardiovascular;  Laterality: N/A;  . RIGHT/LEFT HEART CATH AND CORONARY ANGIOGRAPHY N/A 08/14/2018   Procedure: RIGHT/LEFT HEART CATH AND CORONARY ANGIOGRAPHY;  Surgeon: Yvonne Kendall, MD;  Location: [facility];  Service: Cardiovascular;  Laterality: N/A;  . SHOULDER ARTHROSCOPY Right   . TONSILLECTOMY    . UMBILICAL HERNIA REPAIR       reports that he has never smoked. He has never used smokeless tobacco. He reports that he does not drink alcohol or use drugs.  Allergies  Allergen Reactions  . Methyldopa-Hydrochlorothiazide Other (See Comments)    GYNOCOMASTIA  . Amoxicillin     rash  . Motrin [Ibuprofen]     Chest swelling     Family History  Problem Relation Age of Onset  . Cancer Mother   . Diabetes Mother   . Parkinson's disease Father   . Heart failure Father     Prior to Admission medications   Medication Sig Start Date End Date Taking? Authorizing Provider  atorvastatin (LIPITOR) 40 MG tablet Take 40 mg by mouth every evening.    [provider]  buPROPion (WELLBUTRIN XL) 300 MG 24 hr tablet Take 1 tablet by mouth daily. 05/25/19   [provider]  Cyanocobalamin (B-12) 5000 MCG CAPS Take 1 capsule by mouth every morning.    [provider]  escitalopram (LEXAPRO) 10 MG tablet Take 20 mg by mouth every evening.     [provider]  famotidine (PEPCID) 40 MG tablet Take 40 mg by mouth at bedtime.     [provider]  furosemide (LASIX) 40 MG tablet Take 1.5 tablets (60 mg total) by mouth daily. 08/18/18   Elgergawy, Silver Huguenin, MD  gabapentin (NEURONTIN) 600 MG tablet Take 600-1,200 mg by mouth 2 (two) times daily. 600mg  in the morning and 1200mg  at bedtime    [provider]  HYDROcodone-acetaminophen (NORCO/VICODIN) 5-325 MG tablet Take 1 tablet by mouth every 4 (four) hours as needed for moderate pain.    [provider]  insulin NPH Human (HUMULIN N,NOVOLIN N) 100 UNIT/ML injection Inject 0.1 mLs (10 Units total) into the skin 2 (two) times daily before a meal. 08/18/18   Elgergawy, Silver Huguenin, MD  insulin regular (NOVOLIN R,HUMULIN R) 100 units/mL injection Inject 15 Units into the skin 3 (three) times daily before meals.     [provider]  Multiple Minerals-Vitamins (CAL-MAG-ZINC-D PO) Take 1 tablet by mouth daily.    [provider]  Multiple Vitamin (MULTIVITAMIN WITH MINERALS) TABS tablet Take 1 tablet by mouth every morning.    [provider]  pantoprazole (PROTONIX) 40 MG tablet Take 40 mg by mouth every morning.    [provider]    Physical Exam: Vitals:   06/04/19 1243 06/04/19 1425 06/04/19  1630 06/04/19 1645  BP: 114/66 116/75 111/68 113/77  Pulse: 69 67    Resp: 16 18 19  (!) 21  Temp: 97.9 F (36.6 C) (!) 97.5 F (36.4 C)    TempSrc: Oral Oral    SpO2: 97% 100%    Weight:      Height:        Constitutional: NAD, calm, comfortable Vitals:   06/04/19 1243 06/04/19 1425 06/04/19 1630 06/04/19 1645  BP: 114/66 116/75 111/68 113/77  Pulse: 69 67    Resp: 16 18 19  (!) 21  Temp: 97.9 F (36.6 C) (!) 97.5 F (36.4 C)    TempSrc: Oral Oral    SpO2: 97% 100%    Weight:      Height:       Eyes: PERRL, lids and conjunctivae normal ENMT: Mucous membranes are moist. Posterior pharynx clear of any exudate or lesions.Normal dentition.  Neck:  normal, supple, no masses, no thyromegaly Respiratory: clear to auscultation bilaterally, no wheezing, no crackles. Normal respiratory effort. No accessory muscle use.  Cardiovascular: Regular rate and rhythm,s1, s2 Abdomen: no tenderness, no masses palpated. No hepatosplenomegaly. Bowel sounds positive.  Musculoskeletal: no clubbing / cyanosis. BLE edema extending into lower abdomen Skin: no rashes, lesions, ulcers. No induration Neurologic: CN 2-12 grossly intact. Sensation intact, Strength 5/5 in all 4.  Psychiatric: Normal judgment and insight. Alert and oriented x 3. Normal mood.    Labs on Admission: I have personally reviewed following labs and imaging studies  CBC: Recent Labs  Lab 06/04/19 0943  WBC 9.0  HGB 12.5*  HCT 40.1  MCV 92.2  PLT 262   Basic Metabolic Panel: Recent Labs  Lab 06/04/19 0943  NA 142  K 4.3  CL 102  CO2 28  GLUCOSE 78  BUN 8  CREATININE 0.79  CALCIUM 8.9   GFR: Estimated Creatinine Clearance: 154.8 mL/min (by C-G formula based on SCr of 0.79 mg/dL). Liver Function Tests: No results for input(s): AST, ALT, ALKPHOS, BILITOT, PROT, ALBUMIN in the last 168 hours. No results for input(s): LIPASE, AMYLASE in the last 168 hours. No results for input(s): AMMONIA in the last 168 hours.  Coagulation Profile: No results for input(s): INR, PROTIME in the last 168 hours. Cardiac Enzymes: No results for input(s): CKTOTAL, CKMB, CKMBINDEX, TROPONINI in the last 168 hours. BNP (last 3 results) No results for input(s): PROBNP in the last 8760 hours. HbA1C: No results for input(s): HGBA1C in the last 72 hours. CBG: No results for input(s): GLUCAP in the last 168 hours. Lipid Profile: No results for input(s): CHOL, HDL, LDLCALC, TRIG, CHOLHDL, LDLDIRECT in the last 72 hours. Thyroid Function Tests: No results for input(s): TSH, T4TOTAL, FREET4, T3FREE, THYROIDAB in the last 72 hours. Anemia Panel: No results for input(s): VITAMINB12, FOLATE, FERRITIN, TIBC, IRON, RETICCTPCT in the last 72 hours. Urine analysis: No results found for: COLORURINE, APPEARANCEUR, LABSPEC, PHURINE, GLUCOSEU, HGBUR, BILIRUBINUR, KETONESUR, PROTEINUR, UROBILINOGEN, NITRITE, LEUKOCYTESUR Sepsis Labs: !!!!!!!!!!!!!!!!!!!!!!!!!!!!!!!!!!!!!!!!!!!! @LABRCNTIP (procalcitonin:4,lacticidven:4) )No results found for this or any previous visit (from the past 240 hour(s)).   Radiological Exams on Admission: Dg Chest 2 View  Result Date: 06/04/2019 CLINICAL DATA:  Shortness of breath. EXAM: CHEST - 2 VIEW COMPARISON:  08/30/2017. FINDINGS: Cardiac monitoring device noted. Cardiomegaly with bilateral pulmonary interstitial prominence. Findings consistent with CHF. Findings have improved from prior exam. No pleural effusion or pneumothorax. No acute bony abnormality identified. Degenerative change thoracic spine. IMPRESSION: Cardiomegaly with bilateral pulmonary interstitial prominence, improved from prior exam. Findings suggest improving CHF. Electronically Signed   By: Maisie Fus  Register   On: 06/04/2019 10:06    EKG: Independently reviewed. Sinus  Assessment/Plan Principal Problem:   Acute combined systolic and diastolic CHF Active Problems:   Diabetic neuropathy (HCC)   High cholesterol   Shortness of breath    Type 2 diabetes mellitus with complication, with long-term current use of insulin (HCC)   Morbid obesity with BMI of 45.0-49.9, adult (HCC)   CAD (coronary artery disease)   1. Acutely decompensated combined systolic and diastolic CHF exacerbation 1. Presenting with elevated BNP, marked recent wt gain and generalized edema with CXR findings suggesting acute CHF exacerbation 2. Little to no improvement despite increased dose of PO lasix 3. Will continue on scheduled 60mg  IV lasix BID 4. Monitor I/o and daily wts 5. Follow serial BMET and correct electrolytes as needed 6. Most recent echo from 1/20 demonstrating EF of 35-40%. Will repeat  2d echo 2. SOB 1. Likely secondary to above acute CHF exacerbation 2. Pt is afebrile. No reported COVID exposures 3. COVID screening currently pending. Until results return negative, pt will be considered person under investigation 3. DM2 with neuropathy 1. Most recent a1c from 1/20 noted to be 9.0 2. Random glucose 261 at time of presentation 3. Would continue home insulin and titrate accordingly 4. Continue pt on SSI coverage as needed 4. HLD 1. Seems stable at this time 2. Continue statin per home regimen 5. HTN 1. BP seems stable at this time 2. On chart review, pt was started on low dose toprol xl with ARB stopped given soft BP 3. Currently not on BP meds 4. Will give trial of low dose ACEI given hx CHF as well as DM 6. Morbid obesity 1. Recommend diet/lifestyle modification 7. CAD  1. Chest pain free at this time 2. High sensitivity trop stable, unremarkable 3. EKG reviewed, nonischemic  DVT prophylaxis: Lovenox subQ  Code Status: Full Family Communication: Pt in room, family not at bedside  Disposition Plan: Uncertain at this time  Consults called:  Admission status: Inpt as would likely require greater than 2 midnight stay to fully diurese patient given acute CHF exacerbation.   Rickey Barbara MD [facility] Pager On Amion   If 7PM-7AM, please contact night-coverage  06/04/2019, 5:42 PM

## 2019-06-04 NOTE — Assessment & Plan Note (Signed)
Not on C-pap "for years"

## 2019-06-04 NOTE — ED Provider Notes (Addendum)
[facility] EMERGENCY DEPARTMENT Provider Note   CSN: [id number] Arrival date & time: 06/04/19  0915     History   Chief Complaint Chief Complaint  Patient presents with  . Shortness of Breath    CHF exacerbation    HPI Daniel Valenzuela is a 68 y.o. male.  Presents emerged from chief complaint shortness of breath.  Patient reports over the past few weeks has noted steadily worsening shortness of breath as well as fluid retention and weight gain.  Believes he is gained approximately 40 pounds over the last couple weeks.  Normally takes 60 mg Lasix orally daily, recently went up to 80 mg daily and has had increasing urination but no significant change in his shortness of breath or weight.  Shortness of breath worse with exertion, worse with lying flat.  He has had no associated chest pain.  Case was discussed with his cardiologist via telephone encounter, recommended come to ER for admission.     HPI  Past Medical History:  Diagnosis Date  . Aortic stenosis, moderate 07/2018   felt to be moderate at cath Jan 2020  . CAD (coronary artery disease) 07/2018   mild non obstructive  . CHF (congestive heart failure) (HCC)   . Chronic back pain    "mainly lower right now" (08/12/2018)  . Depression   . Diabetic neuropathy (HCC)   . GERD (gastroesophageal reflux disease)   . Heart murmur   . High cholesterol   . Hypertension   . Morbid obesity with BMI of 45.0-49.9, adult (HCC)   . NICM (nonischemic cardiomyopathy) (HCC) 07/2018   EF 35-40%  . Rheumatoid arteritis (HCC)    "hands" (08/12/2018)  . Sleep apnea    "lost weight; not on CPAP anymore" (08/12/2018)  . Spinal stenosis of lumbar region    "w/slipped disc" (08/12/2018)  . Syncope    Felt to be secondary to autonomic dysfunction-s/p Loop recorder Feb 2019  . Type II diabetes mellitus [facility])     Patient Active Problem List   Diagnosis Date Noted  . Hyperkalemia 06/04/2019  . Morbid obesity with BMI of  45.0-49.9, adult (HCC) 08/31/2018  . Acute combined systolic and diastolic CHF   . Aortic valve stenosis   . Shortness of breath 08/10/2018  . Sleep apnea 08/10/2018  . Type 2 diabetes mellitus with complication, with long-term current use of insulin (HCC) 08/10/2018  . CHF (congestive heart failure) (HCC) 08/10/2018  . CAD (coronary artery disease) 07/29/2018  . Aortic stenosis, moderate 07/29/2018  . Syncope 08/30/2017  . RBBB (right bundle branch block with left anterior fascicular block) 08/30/2017  . Diabetic neuropathy (HCC)   . Obesity   . Chronic back pain   . Spinal stenosis   . High cholesterol     Past Surgical History:  Procedure Laterality Date  . HERNIA REPAIR    . LAPAROSCOPIC GASTRIC BANDING    . LOOP RECORDER INSERTION N/A 09/01/2017   Procedure: LOOP RECORDER INSERTION;  Surgeon: Duke Salvia, MD;  Location: [facility] INVASIVE CV LAB;  Service: Cardiovascular;  Laterality: N/A;  . RIGHT/LEFT HEART CATH AND CORONARY ANGIOGRAPHY N/A 08/14/2018   Procedure: RIGHT/LEFT HEART CATH AND CORONARY ANGIOGRAPHY;  Surgeon: Yvonne Kendall, MD;  Location: [facility] INVASIVE CV LAB;  Service: Cardiovascular;  Laterality: N/A;  . SHOULDER ARTHROSCOPY Right   . TONSILLECTOMY    . UMBILICAL HERNIA REPAIR          Home Medications    Prior to Admission  medications   Medication Sig Start Date End Date Taking? Authorizing Provider  atorvastatin (LIPITOR) 40 MG tablet Take 40 mg by mouth every evening.    [provider]  buPROPion (WELLBUTRIN XL) 300 MG 24 hr tablet Take 1 tablet by mouth daily. 05/25/19   [provider]  Cyanocobalamin (B-12) 5000 MCG CAPS Take 1 capsule by mouth every morning.    [provider]  escitalopram (LEXAPRO) 10 MG tablet Take 20 mg by mouth every evening.     [provider]  famotidine (PEPCID) 40 MG tablet Take 40 mg by mouth at bedtime.     [provider]  furosemide (LASIX) 40 MG tablet Take 1.5 tablets (60  mg total) by mouth daily. 08/18/18   Elgergawy, Leana Roe, MD  gabapentin (NEURONTIN) 600 MG tablet Take 600-1,200 mg by mouth 2 (two) times daily. 600mg  in the morning and 1200mg  at bedtime    [provider]  HYDROcodone-acetaminophen (NORCO/VICODIN) 5-325 MG tablet Take 1 tablet by mouth every 4 (four) hours as needed for moderate pain.    [provider]  insulin NPH Human (HUMULIN N,NOVOLIN N) 100 UNIT/ML injection Inject 0.1 mLs (10 Units total) into the skin 2 (two) times daily before a meal. 08/18/18   Elgergawy, Leana Roe, MD  insulin regular (NOVOLIN R,HUMULIN R) 100 units/mL injection Inject 15 Units into the skin 3 (three) times daily before meals.     [provider]  Multiple Minerals-Vitamins (CAL-MAG-ZINC-D PO) Take 1 tablet by mouth daily.    [provider]  Multiple Vitamin (MULTIVITAMIN WITH MINERALS) TABS tablet Take 1 tablet by mouth every morning.    [provider]  pantoprazole (PROTONIX) 40 MG tablet Take 40 mg by mouth every morning.    [provider]    Family History Family History  Problem Relation Age of Onset  . Cancer Mother   . Diabetes Mother   . Parkinson's disease Father   . Heart failure Father     Social History Social History   Tobacco Use  . Smoking status: Never Smoker  . Smokeless tobacco: Never Used  Substance Use Topics  . Alcohol use: No    Frequency: Never  . Drug use: Never     Allergies   Methyldopa-hydrochlorothiazide, Amoxicillin, and Motrin [ibuprofen]   Review of Systems Review of Systems  Constitutional: Negative for chills and fever.  HENT: Negative for ear pain and sore throat.   Eyes: Negative for pain and visual disturbance.  Respiratory: Positive for shortness of breath. Negative for cough.   Cardiovascular: Positive for leg swelling. Negative for chest pain and palpitations.  Gastrointestinal: Negative for abdominal pain and vomiting.  Genitourinary: Negative for  dysuria and hematuria.  Musculoskeletal: Negative for arthralgias and back pain.  Skin: Negative for color change and rash.  Neurological: Negative for seizures and syncope.  All other systems reviewed and are negative.    Physical Exam Updated Vital Signs BP 113/77   Pulse 67   Temp (!) 97.5 F (36.4 C) (Oral)   Resp (!) 21   Ht 5\' 11"  (1.803 m)   Wt (!) 184.2 kg   SpO2 100%   BMI 56.63 kg/m   Physical Exam Vitals signs and nursing note reviewed.  Constitutional:      Appearance: He is well-developed. He is obese.  HENT:     Head: Normocephalic and atraumatic.  Eyes:     Conjunctiva/sclera: Conjunctivae normal.  Neck:     Musculoskeletal: Neck  supple.  Cardiovascular:     Rate and Rhythm: Normal rate and regular rhythm.     Heart sounds: No murmur.  Pulmonary:     Effort: Pulmonary effort is normal. No respiratory distress.     Breath sounds: Normal breath sounds.  Abdominal:     Palpations: Abdomen is soft.     Tenderness: There is no abdominal tenderness.     Comments: Mild pitting edema noted along lower abdomen  Musculoskeletal:     Comments: Bilateral pitting edema to lower legs  Skin:    General: Skin is warm and dry.  Neurological:     Mental Status: He is alert.      ED Treatments / Results  Labs (all labs ordered are listed, but only abnormal results are displayed) Labs Reviewed  CBC - Abnormal; Notable for the following components:      Result Value   Hemoglobin 12.5 (*)    All other components within normal limits  BRAIN NATRIURETIC PEPTIDE - Abnormal; Notable for the following components:   B Natriuretic Peptide 502.7 (*)    All other components within normal limits  TROPONIN I (HIGH SENSITIVITY) - Abnormal; Notable for the following components:   Troponin I (High Sensitivity) 109 (*)    All other components within normal limits  TROPONIN I (HIGH SENSITIVITY) - Abnormal; Notable for the following components:   Troponin I (High  Sensitivity) 110 (*)    All other components within normal limits  BASIC METABOLIC PANEL    EKG EKG Interpretation  Date/Time:  Friday June 04 2019 09:23:53 EST Ventricular Rate:  79 PR Interval:    QRS Duration: 156 QT Interval:  378 QTC Calculation: 433 R Axis:   8 Text Interpretation: Atrial flutter Non-specific intra-ventricular conduction block Abnormal ECG No significant change since last tracing Confirmed by Alvira Monday ([id number]) on 06/04/2019 1:17:27 PM   Radiology Dg Chest 2 View  Result Date: 06/04/2019 CLINICAL DATA:  Shortness of breath. EXAM: CHEST - 2 VIEW COMPARISON:  08/30/2017. FINDINGS: Cardiac monitoring device noted. Cardiomegaly with bilateral pulmonary interstitial prominence. Findings consistent with CHF. Findings have improved from prior exam. No pleural effusion or pneumothorax. No acute bony abnormality identified. Degenerative change thoracic spine. IMPRESSION: Cardiomegaly with bilateral pulmonary interstitial prominence, improved from prior exam. Findings suggest improving CHF. Electronically Signed   By: Maisie Fus  Register   On: 06/04/2019 10:06    Procedures .Critical Care Performed by: Milagros Loll, MD Authorized by: Milagros Loll, MD   Critical care provider statement:    Critical care time (minutes):  33   Critical care was necessary to treat or prevent imminent or life-threatening deterioration of the following conditions:  Circulatory failure and respiratory failure   Critical care was time spent personally by me on the following activities:  Discussions with consultants, evaluation of patient's response to treatment, examination of patient, ordering and performing treatments and interventions, ordering and review of laboratory studies, ordering and review of radiographic studies, pulse oximetry, re-evaluation of patient's condition, obtaining history from patient or surrogate and review of old charts   (including critical care  time)  Medications Ordered in ED Medications  furosemide (LASIX) injection 80 mg (has no administration in time range)  sodium chloride flush (NS) 0.9 % injection 3 mL (3 mLs Intravenous Given 06/04/19 1707)  aspirin chewable tablet 324 mg (324 mg Oral Given 06/04/19 1705)     Initial Impression / Assessment and Plan / ED Course  I have reviewed the triage  vital signs and the nursing notes.  Pertinent labs & imaging results that were available during my care of the patient were reviewed by me and considered in my medical decision making (see chart for details).        68 year old male past medical history heart failure presents to ER with worsening dyspnea, weight gain, lower extremity edema.  Referred by cardiology to ER for likely admission.  Had increased home Lasix without significant improvement already.  Believe he would benefit from inpatient admission for further management. Reviewed with Cards on Call who will see in consultation. Recommends hospitalist as primary team.  Will consult hospitalist for admission.  Final Clinical Impressions(s) / ED Diagnoses   Final diagnoses:  Acute on chronic heart failure, unspecified heart failure type (HCC)  Hypervolemia, unspecified hypervolemia type    ED Discharge Orders    None       Lucrezia Starch, MD 06/04/19 1718    Lucrezia Starch, MD 06/23/19 1045

## 2019-06-04 NOTE — Assessment & Plan Note (Signed)
Type 2 IDDM with neuropathy

## 2019-06-04 NOTE — Telephone Encounter (Signed)
New Message  Patient's wife Stanton Kidney is calling in to get more information from [facility] about patient's condition and whether he will be admitted to the hospital. States that patient was sent to the hospital from his appointment and patient wants to know if patient is going to be admitted or not.

## 2019-06-04 NOTE — Assessment & Plan Note (Signed)
BMI 56 

## 2019-06-04 NOTE — Assessment & Plan Note (Signed)
Chronic pain medications-followed in [place]

## 2019-06-04 NOTE — Telephone Encounter (Signed)
Returned call to pt wife (DPR) she was just wondering if pt was going to be seen soon and or admitted. Informed her that it is up to the Er, MD as to when ween and if admitted. Verbalized understanding.

## 2019-06-04 NOTE — Assessment & Plan Note (Signed)
He has had persistent hyperkalemia in the past which has limited medical Rx.

## 2019-06-04 NOTE — Assessment & Plan Note (Signed)
Felt to be vasovagal.  Loop implanted Feb 2019- no arrhythmia documented. He has had no recent syncopal events

## 2019-06-04 NOTE — ED Notes (Signed)
ED TO INPATIENT HANDOFF REPORT  ED Nurse Name and Phone #:  Patty [phone]  S Name/Age/Gender Daniel Valenzuela 68 y.o. male Room/Bed: 045C/045C  Code Status   Code Status: Full Code  Home/SNF/Other Home Patient oriented to: self, place, time and situation Is this baseline? Yes   Triage Complete: Triage complete  Chief Complaint chf   Triage Note Pt sent here by his cardiologist for CHF exacerbation.  States he went to see him for increased sob.    Allergies Allergies  Allergen Reactions  . Methyldopa-Hydrochlorothiazide Other (See Comments)    GYNOCOMASTIA  . Motrin [Ibuprofen] Other (See Comments)    GYNOCOMASTIA  . Nsaids Other (See Comments)    Patient has had lap band surgery- cannot tolerate this class of meds  . Amoxicillin Rash    Level of Care/Admitting Diagnosis ED Disposition    ED Disposition Condition Comment   Admit  [facility] [[id number][facility]  Level of Care: Telemetry Medical [104]  Covid Evaluation: Confirmed COVID Negative  Diagnosis: CHF exacerbation [facility](HCC) [[id number]]) [[id number]]  Admitting Physician: Carolyn StareHIU, STEPHEN K [[id number]]  Attending Physician: Jerald KiefHIU, STEPHEN K [[id number]]  Estimated length of stay: 3 - 4 days  Certification:: I certify this patient will need inpatient services for at least 2 midnights  PT Class (Do Not Modify): Inpatient [101]  PT Acc Code (Do Not Modify): Private [1]       B Medical/Surgery History Past Medical History:  Diagnosis Date  . Aortic stenosis, moderate 07/2018   felt to be moderate at cath Jan 2020  . CAD (coronary artery disease) 07/2018   mild non obstructive  . CHF (congestive heart failure) (HCC)   . Chronic back pain    "mainly lower right now" (08/12/2018)  . Depression   . Diabetic neuropathy (HCC)   . GERD (gastroesophageal reflux disease)   . Heart murmur   . High cholesterol   . Hypertension   . Morbid obesity with BMI of 45.0-49.9, adult (HCC)   . NICM (nonischemic cardiomyopathy)  (HCC) 07/2018   EF 35-40%  . Rheumatoid arteritis (HCC)    "hands" (08/12/2018)  . Sleep apnea    "lost weight; not on CPAP anymore" (08/12/2018)  . Spinal stenosis of lumbar region    "w/slipped disc" (08/12/2018)  . Syncope    Felt to be secondary to autonomic dysfunction-s/p Loop recorder Feb 2019  . Type II diabetes mellitus (HCC)    Past Surgical History:  Procedure Laterality Date  . HERNIA REPAIR    . LAPAROSCOPIC GASTRIC BANDING    . LOOP RECORDER INSERTION N/A 09/01/2017   Procedure: LOOP RECORDER INSERTION;  Surgeon: Duke SalviaKlein, Steven C, MD;  [facility];  Service: Cardiovascular;  Laterality: N/A;  . RIGHT/LEFT HEART CATH AND CORONARY ANGIOGRAPHY N/A 08/14/2018   Procedure: RIGHT/LEFT HEART CATH AND CORONARY ANGIOGRAPHY;  Surgeon: Yvonne KendallEnd, Christopher, MD;  Location: [facility] INVASIVE CV LAB;  Service: Cardiovascular;  Laterality: N/A;  . SHOULDER ARTHROSCOPY Right   . TONSILLECTOMY    . UMBILICAL HERNIA REPAIR       A IV Location/Drains/Wounds Patient Lines/Drains/Airways Status   Active Line/Drains/Airways    Name:   Placement date:   Placement time:   Site:   Days:   Peripheral IV 06/04/19 Left Hand   06/04/19    1706    Hand   less than 1          Intake/Output Last 24 hours  Intake/Output Summary (Last 24 hours) at  06/04/2019 2235 Last data filed at 06/04/2019 1950 Gross per 24 hour  Intake -  Output 1800 ml  Net -1800 ml    Labs/Imaging Results for orders placed or performed during the hospital encounter of 06/04/19 (from the past 48 hour(s))  Basic metabolic panel     Status: None   Collection Time: 06/04/19  9:43 AM  Result Value Ref Range   Sodium 142 135 - 145 mmol/L   Potassium 4.3 3.5 - 5.1 mmol/L   Chloride 102 98 - 111 mmol/L   CO2 28 22 - 32 mmol/L   Glucose, Bld 78 70 - 99 mg/dL   BUN 8 8 - 23 mg/dL   Creatinine, Ser 3.47 0.61 - 1.24 mg/dL   Calcium 8.9 8.9 - 42.5 mg/dL   GFR calc non Af Amer >60 >60 mL/min   GFR calc Af Amer >60 >60  mL/min   Anion gap 12 5 - 15    Comment: Performed at [facility] Lab, [address], [place], [place] [address]  CBC     Status: Abnormal   Collection Time: 06/04/19  9:43 AM  Result Value Ref Range   WBC 9.0 4.0 - 10.5 K/uL   RBC 4.35 4.22 - 5.81 MIL/uL   Hemoglobin 12.5 (L) 13.0 - 17.0 g/dL   HCT 75.6 43.3 - 29.5 %   MCV 92.2 80.0 - 100.0 fL   MCH 28.7 26.0 - 34.0 pg   MCHC 31.2 30.0 - 36.0 g/dL   RDW 18.8 41.6 - 60.6 %   Platelets 262 150 - 400 K/uL   nRBC 0.0 0.0 - 0.2 %    Comment: Performed at [facility] Lab, [address] [place], [place] [address]  Troponin I (High Sensitivity)     Status: Abnormal   Collection Time: 06/04/19  9:43 AM  Result Value Ref Range   Troponin I (High Sensitivity) 109 (HH) <18 ng/L    Comment: CRITICAL RESULT CALLED TO, READ BACK BY AND VERIFIED WITH: B MICHAELSON RN [id number] BY A BENNETT (NOTE) Elevated high sensitivity troponin I (hsTnI) values and significant  changes across serial measurements may suggest ACS but many other  chronic and acute conditions are known to elevate hsTnI results.  Refer to the Links section for chest pain algorithms and additional  guidance. Performed at [facility] Lab, [address], [place], [place] [address]   Troponin I (High Sensitivity)     Status: Abnormal   Collection Time: 06/04/19  1:31 PM  Result Value Ref Range   Troponin I (High Sensitivity) 110 (HH) <18 ng/L    Comment: CRITICAL VALUE NOTED.  VALUE IS CONSISTENT WITH PREVIOUSLY REPORTED AND CALLED VALUE. (NOTE) Elevated high sensitivity troponin I (hsTnI) values and significant  changes across serial measurements may suggest ACS but many other  chronic and acute conditions are known to elevate hsTnI results.  Refer to the Links section for chest pain algorithms and additional  guidance. Performed at [facility], [address], [place], [place] [address]   Brain natriuretic peptide     Status: Abnormal   Collection  Time: 06/04/19  1:31 PM  Result Value Ref Range   B Natriuretic Peptide 502.7 (H) 0.0 - 100.0 pg/mL    Comment: Performed at [facility] Lab, [address] [place], [place] [address]  SARS CORONAVIRUS 2 (TAT 6-24 HRS) Nasopharyngeal Nasopharyngeal Swab     Status: None   Collection Time: 06/04/19  5:22 PM  Specimen: Nasopharyngeal Swab  Result Value Ref Range   SARS Coronavirus 2 NEGATIVE NEGATIVE    Comment: (NOTE) SARS-CoV-2 target nucleic acids are NOT DETECTED. The SARS-CoV-2 RNA is generally detectable in upper and lower respiratory specimens during the acute phase of infection. Negative results do not preclude SARS-CoV-2 infection, do not rule out co-infections with other pathogens, and should not be used as the sole basis for treatment or other patient management decisions. Negative results must be combined with clinical observations, patient history, and epidemiological information. The expected result is Negative. Fact Sheet for Patients: HairSlick.no Fact Sheet for Healthcare Providers: quierodirigir.com This test is not yet approved or cleared by the [place] FDA and  has been authorized for detection and/or diagnosis of SARS-CoV-2 by FDA under an Emergency Use Authorization (EUA). This EUA will remain  in effect (meaning this test can be used) for the duration of the COVID-19 declaration under Section 56 4(b)(1) of the Act, 21 U.S.C. section 360bbb-3(b)(1), unless the authorization is terminated or revoked sooner. Performed at [facility] Lab, [address], [place], [place] [address]   Hemoglobin A1c     Status: Abnormal   Collection Time: 06/04/19  6:21 PM  Result Value Ref Range   Hgb A1c MFr Bld 8.4 (H) 4.8 - 5.6 %    Comment: (NOTE) Pre diabetes:          5.7%-6.4% Diabetes:              >6.4% Glycemic control for   <7.0% adults with diabetes    Mean Plasma Glucose 194.38 mg/dL     Comment: Performed at [facility] Lab, [address], [place], [place] [address]  CBC     Status: Abnormal   Collection Time: 06/04/19  6:21 PM  Result Value Ref Range   WBC 10.6 (H) 4.0 - 10.5 K/uL   RBC 4.71 4.22 - 5.81 MIL/uL   Hemoglobin 13.5 13.0 - 17.0 g/dL   HCT 53.6 46.8 - 03.2 %   MCV 91.5 80.0 - 100.0 fL   MCH 28.7 26.0 - 34.0 pg   MCHC 31.3 30.0 - 36.0 g/dL   RDW 12.2 48.2 - 50.0 %   Platelets 300 150 - 400 K/uL   nRBC 0.0 0.0 - 0.2 %    Comment: Performed at [facility] Lab, [address] [place], [place] [address]  Creatinine, serum     Status: None   Collection Time: 06/04/19  7:50 PM  Result Value Ref Range   Creatinine, Ser 0.82 0.61 - 1.24 mg/dL   GFR calc non Af Amer >60 >60 mL/min   GFR calc Af Amer >60 >60 mL/min    Comment: Performed at [facility], [address] [place], [place] [address]  CBG monitoring, ED     Status: None   Collection Time: 06/04/19  9:30 PM  Result Value Ref Range   Glucose-Capillary 89 70 - 99 mg/dL   Dg Chest 2 View  Result Date: 06/04/2019 CLINICAL DATA:  Shortness of breath. EXAM: CHEST - 2 VIEW COMPARISON:  08/30/2017. FINDINGS: Cardiac monitoring device noted. Cardiomegaly with bilateral pulmonary interstitial prominence. Findings consistent with CHF. Findings have improved from prior exam. No pleural effusion or pneumothorax. No acute bony abnormality identified. Degenerative change thoracic spine. IMPRESSION: Cardiomegaly with bilateral pulmonary interstitial prominence, improved from prior exam. Findings suggest improving CHF. Electronically Signed   By: Maisie Fus  Register   On: 06/04/2019 10:06    Pending Labs  Unresulted Labs (From admission, onward)    Start     Ordered   06/11/19 0500  Creatinine, serum  (enoxaparin (LOVENOX)    CrCl >/= 30 ml/min)  Weekly,   R    Comments: while on enoxaparin therapy    06/04/19 1820   06/05/19 6160  Basic metabolic panel  Daily,   R     06/04/19 1820   06/05/19 0500   Comprehensive metabolic panel  Tomorrow morning,   R     06/04/19 1820   06/05/19 0500  CBC  Tomorrow morning,   R     06/04/19 1820   06/04/19 1821  HIV Antibody (routine testing w rflx)  (HIV Antibody (Routine testing w reflex) panel)  Once,   STAT     06/04/19 1820          Vitals/Pain Today's Vitals   06/04/19 2045 06/04/19 2140 06/04/19 2144 06/04/19 2145  BP: 122/81  123/65 115/64  Pulse:   80 79  Resp: 13  13 17   Temp:   98.1 F (36.7 C)   TempSrc:   Oral   SpO2:   96% 97%  Weight:      Height:      PainSc:  0-No pain      Isolation Precautions No active isolations  Medications Medications  enoxaparin (LOVENOX) injection 90 mg (90 mg Subcutaneous Given 06/04/19 2042)  insulin aspart (novoLOG) injection 0-15 Units (has no administration in time range)  insulin aspart (novoLOG) injection 0-5 Units (0 Units Subcutaneous Not Given 06/04/19 2131)  lisinopril (ZESTRIL) tablet 10 mg (has no administration in time range)  atorvastatin (LIPITOR) tablet 40 mg (40 mg Oral Given 06/04/19 2041)  buPROPion (WELLBUTRIN XL) 24 hr tablet 300 mg (300 mg Oral Given 06/04/19 2041)  escitalopram (LEXAPRO) tablet 20 mg (20 mg Oral Given 06/04/19 2041)  gabapentin (NEURONTIN) tablet 600 mg (has no administration in time range)  HYDROcodone-acetaminophen (NORCO/VICODIN) 5-325 MG per tablet 1 tablet (has no administration in time range)  insulin NPH Human (NOVOLIN N) injection 30 Units (has no administration in time range)  insulin aspart (novoLOG) injection 15 Units (has no administration in time range)  pantoprazole (PROTONIX) EC tablet 40 mg (has no administration in time range)  gabapentin (NEURONTIN) tablet 1,200 mg (1,200 mg Oral Given 06/04/19 2135)  furosemide (LASIX) injection 60 mg (has no administration in time range)  sodium chloride flush (NS) 0.9 % injection 3 mL (3 mLs Intravenous Given 06/04/19 1707)  aspirin chewable tablet 324 mg (324 mg Oral Given 06/04/19 1705)  furosemide  (LASIX) injection 80 mg (80 mg Intravenous Given 06/04/19 1723)    Mobility walks     Focused Assessments    R Recommendations: See Admitting Provider Note  Report given to:   Additional Notes:

## 2019-06-04 NOTE — Patient Instructions (Addendum)
PLEASE GO TO  EMERGENCY DEPARTMENT. I HAVE ALREADY CALLED THEM AND THEY KNOW YOU ARE ON YOUR WAY.

## 2019-06-04 NOTE — Progress Notes (Signed)
Cardiology Office Note:    Date:  06/04/2019   ID:  Daniel Valenzuela, DOB Jun 22, 1951, MRN [id number]  PCP:  Asencion Noble, MD  Cardiologist:  Cristopher Peru, MD  Electrophysiologist:  Cristopher Peru, MD   Referring MD: Asencion Noble, MD   C/C: SOB and edema  History of Present Illness:    Daniel Valenzuela is a 68 y.o. male from [place] with a hx of morbid obesity with BMI of 50, IDDM, sleep apnea-not on C-pap, LBBB, persistent hyperkalemia, and syncope felt to be vasovagal (s/p Loop Feb 2019).  The patient was admitted January 2020 with acute heart failure.  His echocardiogram jan 2020 showed an ejection fraction of 35 to 40% with moderate to severe AS.  Catheterization revealed mild coronary disease, with moderate AS.  When he was admitted he was almost 400 pounds, by follow-up he was 350 pounds.  He saw Dr. Lovena Le in July.  His weight then was 362 pounds.  In April 2020 he was 345 pounds, that appears to be his low weight.  Dr. Lovena Le did mention the possibility of TAVR at some point in the future.  The patient presents today with increasing edema and shortness of breath.  His weight is 406 pounds.  He denies any missed medications.  He did try taking an extra 20 mg of Lasix to a total of 80 mg daily for 4 days but this had no significant effect.  I I reviewed this with Dr. Debara Pickett in the office.  We feel the patient would be best served by admission as opposed to an outpatient diuretic adjustment and follow-up.   Past Medical History:  Diagnosis Date  . Aortic stenosis, moderate 07/2018   felt to be moderate at cath Jan 2020  . CAD (coronary artery disease) 07/2018   mild non obstructive  . Chronic back pain    "mainly lower right now" (08/12/2018)  . Depression   . Diabetic neuropathy ([facility])   . GERD (gastroesophageal reflux disease)   . Heart murmur   . High cholesterol   . Hypertension   . Morbid obesity with BMI of 45.0-49.9, adult ([facility])   . NICM (nonischemic cardiomyopathy) ([place])  07/2018   EF 35-40%  . Rheumatoid arteritis ([facility])    "hands" (08/12/2018)  . Sleep apnea    "lost weight; not on CPAP anymore" (08/12/2018)  . Spinal stenosis of lumbar region    "w/slipped disc" (08/12/2018)  . Syncope    Felt to be secondary to autonomic dysfunction-s/p Loop recorder Feb 2019  . Type II diabetes mellitus ([place])     Past Surgical History:  Procedure Laterality Date  . HERNIA REPAIR    . LAPAROSCOPIC GASTRIC BANDING    . LOOP RECORDER INSERTION N/A 09/01/2017   Procedure: LOOP RECORDER INSERTION;  Surgeon: Deboraha Sprang, MD;  Location: [facility];  Service: Cardiovascular;  Laterality: N/A;  . RIGHT/LEFT HEART CATH AND CORONARY ANGIOGRAPHY N/A 08/14/2018   Procedure: RIGHT/LEFT HEART CATH AND CORONARY ANGIOGRAPHY;  Surgeon: Nelva Bush, MD;  Location: [facility];  Service: Cardiovascular;  Laterality: N/A;  . SHOULDER ARTHROSCOPY Right   . TONSILLECTOMY    . UMBILICAL HERNIA REPAIR      Current Medications: Current Meds  Medication Sig  . atorvastatin (LIPITOR) 40 MG tablet Take 40 mg by mouth every evening.  Marland Kitchen buPROPion (WELLBUTRIN XL) 300 MG 24 hr tablet Take 1 tablet by mouth daily.  . Cyanocobalamin (B-12) 5000 MCG CAPS Take 1  capsule by mouth every morning.  . escitalopram (LEXAPRO) 10 MG tablet Take 20 mg by mouth every evening.   . famotidine (PEPCID) 40 MG tablet Take 40 mg by mouth at bedtime.   . furosemide (LASIX) 40 MG tablet Take 1.5 tablets (60 mg total) by mouth daily.  Marland Kitchen gabapentin (NEURONTIN) 600 MG tablet Take 600-1,200 mg by mouth 2 (two) times daily. 600mg  in the morning and 1200mg  at bedtime  . HYDROcodone-acetaminophen (NORCO/VICODIN) 5-325 MG tablet Take 1 tablet by mouth every 4 (four) hours as needed for moderate pain.  Marland Kitchen insulin NPH Human (HUMULIN N,NOVOLIN N) 100 UNIT/ML injection Inject 0.1 mLs (10 Units total) into the skin 2 (two) times daily before a meal.  . insulin regular (NOVOLIN R,HUMULIN R) 100 units/mL  injection Inject 15 Units into the skin 3 (three) times daily before meals.   . Multiple Minerals-Vitamins (CAL-MAG-ZINC-D PO) Take 1 tablet by mouth daily.  . Multiple Vitamin (MULTIVITAMIN WITH MINERALS) TABS tablet Take 1 tablet by mouth every morning.  . pantoprazole (PROTONIX) 40 MG tablet Take 40 mg by mouth every morning.     Allergies:   Methyldopa-hydrochlorothiazide, Amoxicillin, and Motrin [ibuprofen]   Social History   Socioeconomic History  . Marital status: Married    Spouse name: Not on file  . Number of children: Not on file  . Years of education: Not on file  . Highest education level: Not on file  Occupational History  . Not on file  Social Needs  . Financial resource strain: Not on file  . Food insecurity    Worry: Not on file    Inability: Not on file  . Transportation needs    Medical: Not on file    Non-medical: Not on file  Tobacco Use  . Smoking status: Never Smoker  . Smokeless tobacco: Never Used  Substance and Sexual Activity  . Alcohol use: No    Frequency: Never  . Drug use: Never  . Sexual activity: Not Currently  Lifestyle  . Physical activity    Days per week: Not on file    Minutes per session: Not on file  . Stress: Not on file  Relationships  . Social Musician on phone: Not on file    Gets together: Not on file    Attends religious service: Not on file    Active member of club or organization: Not on file    Attends meetings of clubs or organizations: Not on file    Relationship status: Not on file  Other Topics Concern  . Not on file  Social History Narrative  . Not on file     Family History: The patient's family history includes Cancer in his mother; Diabetes in his mother; Heart failure in his father; Parkinson's disease in his father.  ROS:   Please see the history of present illness.     All other systems reviewed and are negative.  EKGs/Labs/Other Studies Reviewed:    The following studies were  reviewed today: Cath Rt and Lt Jan 2020  EKG:  EKG is ordered today.  The ekg ordered today demonstrates a regular rhythm-rate 87, with IVCD.  There is no obvious P-wave- similar to prior EKGs.   Recent Labs: 08/10/2018: ALT 19 08/15/2018: Hemoglobin 12.1; Platelets 248 08/31/2018: B Natriuretic Peptide 298.0 10/05/2018: BUN 14; Creatinine, Ser 0.87; Potassium 5.2; Sodium 141  Recent Lipid Panel    Component Value Date/Time   CHOL 78 08/16/2018 0431   TRIG  53 08/16/2018 0431   HDL 35 (L) 08/16/2018 0431   CHOLHDL 2.2 08/16/2018 0431   VLDL 11 08/16/2018 0431   LDLCALC 32 08/16/2018 0431    Physical Exam:    VS:  BP 132/74   Pulse 87   Temp (!) 97.1 F (36.2 C) (Temporal)   Ht 5\' 11"  (1.803 m)   Wt (!) 406 lb (184.2 kg)   SpO2 97%   BMI 56.63 kg/m     Wt Readings from Last 3 Encounters:  06/04/19 (!) 406 lb (184.2 kg)  02/04/19 (!) 362 lb (164.2 kg)  11/03/18 (!) 345 lb (156.5 kg)     GEN:  Morbidly obese Caucasian male- SOB with any exertion  HEENT: Normal NECK: No JVD; transmitted murmur LYMPHATICS: No lymphadenopathy CARDIAC: RRR with 2/6 systolic murmur AOV and LSB, no rubs, gallops RESPIRATORY:  Decreased breath sounds, no obvious rales ABDOMEN: morbidly obese with lage panus MUSCULOSKELETAL:  2-3+ pitting edema in LE No deformity  SKIN: Warm and dry NEUROLOGIC:  Alert and oriented x 3 PSYCHIATRIC:  Normal affect   ASSESSMENT:    Acute combined systolic and diastolic CHF Admitted 08/11/2018 with acute CHF. Echo showed new LVF- 35-40%.  Cath mild CAD.  CHF felt to be secondary to dietary indiscretion.  Pt now presents with increased edema, SOB, and 40 lb weight gain since July.  Aortic stenosis, moderate Moderate to severe by echo-felt to be moderate at cath Jan 2020  Morbid obesity with BMI of 45.0-49.9, adult ([facility]) BMI 56  Syncope Felt to be vasovagal.  Loop implanted Feb 2019- no arrhythmia documented. He has had no recent syncopal events  Sleep  apnea Not on C-pap "for years"  Type 2 diabetes mellitus with complication, with long-term current use of insulin (HCC) Type 2 IDDM with neuropathy  Chronic back pain Chronic pain medications-followed in [place]  Hyperkalemia He has had persistent hyperkalemia in the past which has limited medical Rx.  PLAN:    Reviewed with Dr [place]- plan admit for diuresis.  It's possible his AS may be contributing and we may need to consider the possibility of TAVR sooner rather than later.   Medication Adjustments/Labs and Tests Ordered: Current medicines are reviewed at length with the patient today.  Concerns regarding medicines are outlined above.  Orders Placed This Encounter  Procedures  . EKG 12-Lead   No orders of the defined types were placed in this encounter.   Patient Instructions  PLEASE GO TO [facility] EMERGENCY DEPARTMENT. I HAVE ALREADY CALLED THEM AND THEY KNOW YOU ARE ON YOUR WAY.     Rennis Golden, PA-C  06/04/2019 8:47 AM    [facility]

## 2019-06-05 ENCOUNTER — Inpatient Hospital Stay (HOSPITAL_COMMUNITY): Payer: Medicare Other

## 2019-06-05 DIAGNOSIS — I350 Nonrheumatic aortic (valve) stenosis: Secondary | ICD-10-CM

## 2019-06-05 DIAGNOSIS — I361 Nonrheumatic tricuspid (valve) insufficiency: Secondary | ICD-10-CM

## 2019-06-05 DIAGNOSIS — I5023 Acute on chronic systolic (congestive) heart failure: Secondary | ICD-10-CM

## 2019-06-05 LAB — COMPREHENSIVE METABOLIC PANEL
ALT: 19 U/L (ref 0–44)
AST: 24 U/L (ref 15–41)
Albumin: 3 g/dL — ABNORMAL LOW (ref 3.5–5.0)
Alkaline Phosphatase: 96 U/L (ref 38–126)
Anion gap: 13 (ref 5–15)
BUN: 11 mg/dL (ref 8–23)
CO2: 29 mmol/L (ref 22–32)
Calcium: 8.9 mg/dL (ref 8.9–10.3)
Chloride: 99 mmol/L (ref 98–111)
Creatinine, Ser: 0.92 mg/dL (ref 0.61–1.24)
GFR calc Af Amer: >60 mL/min (ref >60)
GFR calc non Af Amer: >60 mL/min (ref >60)
Glucose, Bld: 199 mg/dL — ABNORMAL HIGH (ref 70–99)
Potassium: 4.3 mmol/L (ref 3.5–5.1)
Sodium: 141 mmol/L (ref 135–145)
Total Bilirubin: 1.1 mg/dL (ref 0.3–1.2)
Total Protein: 6.6 g/dL (ref 6.5–8.1)

## 2019-06-05 LAB — CBC
HCT: 41.9 % (ref 39.0–52.0)
Hemoglobin: 13.5 g/dL (ref 13.0–17.0)
MCH: 29 pg (ref 26.0–34.0)
MCHC: 32.2 g/dL (ref 30.0–36.0)
MCV: 89.9 fL (ref 80.0–100.0)
Platelets: 288 10*3/uL (ref 150–400)
RBC: 4.66 MIL/uL (ref 4.22–5.81)
RDW: 15.3 % (ref 11.5–15.5)
WBC: 10.8 10*3/uL — ABNORMAL HIGH (ref 4.0–10.5)
nRBC: 0 % (ref 0.0–0.2)

## 2019-06-05 LAB — ECHOCARDIOGRAM COMPLETE
Height: 71 in
Weight: 6299.2 oz

## 2019-06-05 LAB — GLUCOSE, CAPILLARY
Glucose-Capillary: 193 mg/dL — ABNORMAL HIGH (ref 70–99)
Glucose-Capillary: 214 mg/dL — ABNORMAL HIGH (ref 70–99)
Glucose-Capillary: 190 mg/dL — ABNORMAL HIGH (ref 70–99)
Glucose-Capillary: 108 mg/dL — ABNORMAL HIGH (ref 70–99)

## 2019-06-05 MED ORDER — ATORVASTATIN CALCIUM 80 MG PO TABS
80.0000 mg | ORAL_TABLET | Freq: Every evening | ORAL | Status: DC
  Administered 2019-06-05 – 2019-06-12 (×8): 80 mg via ORAL
  Filled 2019-06-05 (×8): qty 1

## 2019-06-05 MED ORDER — PERFLUTREN LIPID MICROSPHERE
INTRAVENOUS | Status: AC
  Administered 2019-06-05: 11:00:00 3 mL via INTRAVENOUS
  Filled 2019-06-05: qty 10

## 2019-06-05 MED ORDER — PERFLUTREN LIPID MICROSPHERE
1.0000 mL | INTRAVENOUS | Status: AC | PRN
  Filled 2019-06-05: qty 10

## 2019-06-05 NOTE — Consult Note (Signed)
Cardiology Consultation:   Patient ID: Daniel Valenzuela MRN: [id number]; DOB: 1950-08-18  Admit date: 06/04/2019 Date of Consult: 06/05/2019  Primary Care Provider: Carylon Perches, MD Primary Cardiologist: Lewayne Bunting, MD  Primary Electrophysiologist:  Lewayne Bunting, MD   Patient Profile:   Daniel Valenzuela is a 68 y.o. male with a hx of chronic sysotlic HF and aortic stenosis who is being seen today for the evaluation of SOB at the request of Dr .Clabe Seal  History of Present Illness:   Daniel Valenzuela 68 yo male history of DM2, OSA, chronic LBBB, vasovagal syncope with loop recorder, chronic systolic HF prior LVEF 35-40%, aortic stenosis, mild CAD by cath presents with SOB, LE edema, and severe weigth. He reports medication compliance. Reports has not been limiting his sodium intake.   In April weight 345 lbs.In July weight was 362 lbs. In clinid appt prior to admission weight up to 405 lbs.   K 4.3 Cr 0.79 BUN 8 WBC 9 Hgb 12.5 Plt 262 BNP 502 Hgb A1c 8.,4  COVID neg  Hstrop 109-->110 EKG SR IVCD CXR cardiomegaly, pulm edema Echo LVEF 30-35%, indet diastolic function, normal RV, AV mean grade 35,  AVA VTI 0.73, dimensionless index 0.30 Jul 2018 cath: mild to mod CAD   Heart Pathway Score:     Past Medical History:  Diagnosis Date  . Aortic stenosis, moderate 07/2018   felt to be moderate at cath Jan 2020  . CAD (coronary artery disease) 07/2018   mild non obstructive  . CHF (congestive heart failure) ([facility])   . Chronic back pain    "mainly lower right now" (08/12/2018)  . Depression   . Diabetic neuropathy ([facility])   . GERD (gastroesophageal reflux disease)   . Heart murmur   . High cholesterol   . Hypertension   . Morbid obesity with BMI of 45.0-49.9, adult ([facility])   . NICM (nonischemic cardiomyopathy) ([facility]) 07/2018   EF 35-40%  . Rheumatoid arteritis ([facility])    "hands" (08/12/2018)  . Sleep apnea    "lost weight; not on CPAP anymore" (08/12/2018)  . Spinal stenosis of lumbar region     "w/slipped disc" (08/12/2018)  . Syncope    Felt to be secondary to autonomic dysfunction-s/p Loop recorder Feb 2019  . Type II diabetes mellitus (H[facility])     Past Surgical History:  Procedure Laterality Date  . HERNIA REPAIR    . LAPAROSCOPIC GASTRIC BANDING    . LOOP RECORDER INSERTION N/A 09/01/2017   Procedure: LOOP RECORDER INSERTION;  Surgeon: Duke Salvia, MD;  [facility]: [facility];  Service: Cardiovascular;  Laterality: N/A;  . RIGHT/LEFT HEART CATH AND CORONARY ANGIOGRAPHY N/A 08/14/2018   Procedure: RIGHT/LEFT HEART CATH AND CORONARY ANGIOGRAPHY;  Surgeon: Yvonne Kendall, MD;  Location: [facility];  Service: Cardiovascular;  Laterality: N/A;  . SHOULDER ARTHROSCOPY Right   . TONSILLECTOMY    . UMBILICAL HERNIA REPAIR       Inpatient Medications: Scheduled Meds: . atorvastatin  80 mg Oral QPM  . buPROPion  300 mg Oral Daily  . enoxaparin (LOVENOX) injection  90 mg Subcutaneous Q24H  . escitalopram  20 mg Oral QPM  . furosemide  60 mg Intravenous BID  . gabapentin  1,200 mg Oral QHS  . gabapentin  600 mg Oral Q breakfast  . insulin aspart  0-15 Units Subcutaneous TID WC  . insulin aspart  0-5 Units Subcutaneous QHS  . insulin aspart  15 Units Subcutaneous TID WC  .  insulin NPH Human  30 Units Subcutaneous BID AC & HS  . lisinopril  10 mg Oral Daily  . pantoprazole  40 mg Oral q morning - 10a   Continuous Infusions:  PRN Meds: HYDROcodone-acetaminophen  Allergies:    Allergies  Allergen Reactions  . Methyldopa-Hydrochlorothiazide Other (See Comments)    GYNOCOMASTIA  . Motrin [Ibuprofen] Other (See Comments)    GYNOCOMASTIA  . Nsaids Other (See Comments)    Patient has had lap band surgery- cannot tolerate this class of meds  . Amoxicillin Rash    Social History:   Social History   Socioeconomic History  . Marital status: Married    Spouse name: Not on file  . Number of children: Not on file  . Years of education: Not on file  .  Highest education level: Not on file  Occupational History  . Not on file  Social Needs  . Financial resource strain: Not on file  . Food insecurity    Worry: Not on file    Inability: Not on file  . Transportation needs    Medical: Not on file    Non-medical: Not on file  Tobacco Use  . Smoking status: Never Smoker  . Smokeless tobacco: Never Used  Substance and Sexual Activity  . Alcohol use: No    Frequency: Never  . Drug use: Never  . Sexual activity: Not Currently  Lifestyle  . Physical activity    Days per week: Not on file    Minutes per session: Not on file  . Stress: Not on file  Relationships  . Social Musician on phone: Not on file    Gets together: Not on file    Attends religious service: Not on file    Active member of club or organization: Not on file    Attends meetings of clubs or organizations: Not on file    Relationship status: Not on file  . Intimate partner violence    Fear of current or ex partner: Not on file    Emotionally abused: Not on file    Physically abused: Not on file    Forced sexual activity: Not on file  Other Topics Concern  . Not on file  Social History Narrative  . Not on file    Family History:    Family History  Problem Relation Age of Onset  . Cancer Mother   . Diabetes Mother   . Parkinson's disease Father   . Heart failure Father      ROS:  Please see the history of present illness.  All other ROS reviewed and negative.     Physical Exam/Data:   Vitals:   06/05/19 0437 06/05/19 0733 06/05/19 1131 06/05/19 1602  BP: (!) 134/51 (!) 102/32 113/75 95/83  Pulse: 94 79 86 80  Resp: 18 20 20 20   Temp: 97.6 F (36.4 C) 98.6 F (37 C) 97.6 F (36.4 C) 97.6 F (36.4 C)  TempSrc: Oral Oral Oral Oral  SpO2: 97% 93% 96% 97%  Weight: (!) 178.6 kg     Height: 5\' 11"  (1.803 m)       Intake/Output Summary (Last 24 hours) at 06/05/2019 1621 Last data filed at 06/05/2019 0837 Gross per 24 hour  Intake 500  ml  Output 4675 ml  Net -4175 ml   Last 3 Weights 06/05/2019 06/04/2019 06/04/2019  Weight (lbs) 393 lb 11.2 oz 397 lb 3.2 oz 406 lb  Weight (kg) 178.581 kg 180.169 kg  184.16 kg     Body mass index is 54.91 kg/m.  General:  Well nourished, well developed, in no acute distress HEENT: normal Lymph: no adenopathy Neck: no JVD Endocrine:  No thryomegaly Vascular: No carotid bruits; FA pulses 2+ bilaterally without bruits  Cardiac:  normal S1, S2; 3/6 systolic murmur rusb Lungs:  clear to auscultation bilaterally, no wheezing, rhonchi or rales  Abd: soft, nontender, no hepatomegaly  Ext: 2+ bilateral edema Musculoskeletal:  No deformities, BUE and BLE strength normal and equal Skin: warm and dry  Neuro:  CNs 2-12 intact, no focal abnormalities noted Psych:  Normal affect    Laboratory Data:  High Sensitivity Troponin:   Recent Labs  Lab 06/04/19 0943 06/04/19 1331  TROPONINIHS 109* 110*     Chemistry Recent Labs  Lab 06/04/19 0943 06/04/19 1950 06/05/19 0410  NA 142  --  141  K 4.3  --  4.3  CL 102  --  99  CO2 28  --  29  GLUCOSE 78  --  199*  BUN 8  --  11  CREATININE 0.79 0.82 0.92  CALCIUM 8.9  --  8.9  GFRNONAA >60 >60 >60  GFRAA >60 >60 >60  ANIONGAP 12  --  13    Recent Labs  Lab 06/05/19 0410  PROT 6.6  ALBUMIN 3.0*  AST 24  ALT 19  ALKPHOS 96  BILITOT 1.1   Hematology Recent Labs  Lab 06/04/19 0943 06/04/19 1821 06/05/19 0410  WBC 9.0 10.6* 10.8*  RBC 4.35 4.71 4.66  HGB 12.5* 13.5 13.5  HCT 40.1 43.1 41.9  MCV 92.2 91.5 89.9  MCH 28.7 28.7 29.0  MCHC 31.2 31.3 32.2  RDW 15.0 15.3 15.3  PLT 262 300 288   BNP Recent Labs  Lab 06/04/19 1331  BNP 502.7*    DDimer No results for input(s): DDIMER in the last 168 hours.   Radiology/Studies:  Dg Chest 2 View  Result Date: 06/04/2019 CLINICAL DATA:  Shortness of breath. EXAM: CHEST - 2 VIEW COMPARISON:  08/30/2017. FINDINGS: Cardiac monitoring device noted. Cardiomegaly with  bilateral pulmonary interstitial prominence. Findings consistent with CHF. Findings have improved from prior exam. No pleural effusion or pneumothorax. No acute bony abnormality identified. Degenerative change thoracic spine. IMPRESSION: Cardiomegaly with bilateral pulmonary interstitial prominence, improved from prior exam. Findings suggest improving CHF. Electronically Signed   By: Marcello Moores  Register   On: 06/04/2019 10:06    Assessment and Plan:   1. Acute on chronic systolic HF - massive weight gain, about 40 lbs since July - echo this admit LVEF 30-35%, severe AS - negative 3.2 L yesterday, he is on lasix 60mg  IV bid. Renal function is stable - continue IV diuresis, going to take quite some time. Net negative 2-3 L per day would be our goal - medical therapy with lisinopril 10mg . Im not sure what happened to his toprol, I dont see any documentation that it was to be discontinued. Does not have on his list. Hold at this time since decompensated, on tele pretty long 1st degree av block as well. Repeat eck  2. Aortic stenosis AV mean grade 35,  AVA VTI 0.73, dimensionless index 0.2. Just misses mean gradient critiera in setting of low LVEF, fair to say his AS is indeed severe.  - will need to be evaluated by structural heart team once he is closer to euvolemic.     For questions or updates, please contact [place] Please consult [email or web address] for contact info  under     Signed, Dina Rich, MD  06/05/2019 4:21 PM

## 2019-06-05 NOTE — Progress Notes (Signed)
ekg per order on dayshift, done per day shift, result placed on chart, no record saved in system.

## 2019-06-05 NOTE — Discharge Instructions (Signed)

## 2019-06-05 NOTE — Progress Notes (Signed)
Patient ID: Daniel Valenzuela, male   DOB: 1951-01-22, 68 y.o.   MRN: [id number]  PROGRESS NOTE    Daniel Valenzuela  XLK:[id number] DOB: 1951-04-24 DOA: 06/04/2019 PCP: Carylon Perches, MD   Brief Narrative:  68 year old male with history of insulin-dependent diabetes mellitus type 2, heart failure with reduced ejection fraction (last echocardiogram on January 2020 showed ejection fraction of 35 to 40%, hypertension, hyperlipidemia admitted with acute shortness of breath worsening bilateral and abdominal edema.  Patient endorsed about 40 pounds weight gain.  He admitted to frequent consumption of coffee and beverages, mostly unsweetened tea which has been ongoing for the past several months.  He takes Lasix p.o. 40 mg daily which appears not to be effective as a highly makes much urine after taking Lasix. In the ED, he has elevated BNP, troponin of 110 with a stable creatinine of 0.79. Chest x-ray showed cardiomegaly with bilateral pulmonary interstitial prominence consistent with fluid overload. Echocardiogram done on 06/05/2019 showed estimated ejection fraction of 30 to 35%, when compared with echo of January 2020 showed some reduction in ejection fraction.  There is moderate to severe decreased left ventricular function.  There is severe aortic stenosis with aortic valve area less than 1 cm. We will continue with guideline directed medical therapy and consult cardiology for evaluation.     Assessment & Plan:   Principal Problem:   Acute combined systolic and diastolic CHF Active Problems:   Diabetic neuropathy (HCC)   High cholesterol   Shortness of breath   Type 2 diabetes mellitus with complication, with long-term current use of insulin (HCC)   Morbid obesity with BMI of 45.0-49.9, adult (HCC)   CAD (coronary artery disease)   CHF exacerbation (HCC)  Clinical problems list 1.  Acute exacerbation of heart failure with reduced ejection fraction 2.  Diabetes mellitus type 2, with long-term  insulin use/diabetic neuropathy 3.  Hyperlipidemia 4.  Essential hypertension 5.  Elevated troponin 6.  Morbid obesity BMI 54.9  1.  Acute exacerbation of heart failure with reduced ejection fraction. Patient presenting with worsening shortness of breath, bilateral pitting pedal edema, abdominal fullness and 40 pound weight gain.  Admitted to increased fluid consumption especially coffee with unsweetened tea. Takes Lasix 40 mg daily at home which appears not to have been effective.  Echocardiogram done in January 2020 showed ejection fraction of 35 to 40% by estimation.  Repeat echocardiogram today was significant for ejection fraction of 30 to 35% with moderate to severe decrease left ventricular function and severe aortic stenosis with aortic valve area less than 1 cm. Will continue with IV Lasix 60 mg twice daily as blood pressure permits Continue with guideline directed medical therapy with aspirin, high intensity statin, beta-blocker and ACE inhibitor. Hold off on beta-blocker and ACE inhibitor due to low blood pressure with systolic of 90.  Restart as soon as blood pressure permits. 2 g sodium diet Fluid restriction to 1500 cc per 24 hours Daily weight Cardiology consulted.  Spoke with Dr. Wyline Mood  2.  Diabetes mellitus type 2, with long-term insulin use/diabetic neuropathy.  Hemoglobin A1c 8.4 Continue with sliding scale insulin Continue long-acting insulin Mealtime insulin 3 times daily Fingersticks before meals and at bedtime Hypoglycemic protocol Continue with gabapentin  3.  Hyperlipidemia Continue with atorvastatin 80 mg daily  4.  Essential hypertension.  Blood pressure currently running low without compensatory/reflex tachycardia concerning for possible cardiac ischemia. Hold antihypertensives and careful use of Lasix given current hypotension. Reinitiate antihypertensive medication with elevation  in blood pressure  5.  Elevated troponin.  High-sensitivity troponin  elevated at 110. EKG showed AV conduction delay with T wave inversion in leads V1.  A possible septal infarct.  When compared with EKG of August 10, 2018, appears not to be significant.    5.  Morbid obesity BMI 54.91 Nutritional referral   DVT prophylaxis: Lovenox subcu  Code Status: Full  Family Communication: Discussed with patient  Disposition Plan: Pending clinical improvement    Consultants:   Cardiology  Procedures: 2D echocardiogram 06/05/2019: Ejection fraction 30 to 35% with severely reduced left ventricular function.   Antimicrobials: None    Subjective: Patient is laying quietly in bed, alert and oriented x3.  Morbidly obese not in acute distress.  Denies any chest pain, palpitation or diaphoresis.  Objective: Vitals:   06/05/19 0107 06/05/19 0437 06/05/19 0733 06/05/19 1131  BP:  (!) 134/51 (!) 102/32 113/75  Pulse:  94 79 86  Resp: 20 18 20 20   Temp:  97.6 F (36.4 C) 98.6 F (37 C) 97.6 F (36.4 C)  TempSrc:  Oral Oral Oral  SpO2:  97% 93% 96%  Weight:  (!) 178.6 kg    Height:  5\' 11"  (1.803 m)      Intake/Output Summary (Last 24 hours) at 06/05/2019 1540 Last data filed at 06/05/2019 0837 Gross per 24 hour  Intake 500 ml  Output 4675 ml  Net -4175 ml   Filed Weights   06/04/19 0938 06/04/19 2322 06/05/19 0437  Weight: (!) 184.2 kg (!) 180.2 kg (!) 178.6 kg    Examination:  General exam: Appears calm and comfortable  Respiratory system: Bilateral mid and basilar crackles.    Respiratory effort normal. Cardiovascular system: S1 & S2 heard, RRR. No JVD, murmurs, rubs, gallops or clicks.  Distance heart sounds due to morbid obesity. Gastrointestinal system: Abdomen is distended, firm to touch and dull to percussion.  Unable to palpate any organ due to abdominal distention.   Central nervous system: Alert and oriented. No focal neurological deficits. Extremities: Symmetric 5 x 5 power.  Bilateral 2+ pitting pedal edema up to mid shin Skin:  Multiple ecchymotic areas of lower extremity  Psychiatry: Judgement and insight appear normal. Mood & affect appropriate.     Data Reviewed: I have personally reviewed following labs and imaging studies  CBC: Recent Labs  Lab 06/04/19 0943 06/04/19 1821 06/05/19 0410  WBC 9.0 10.6* 10.8*  HGB 12.5* 13.5 13.5  HCT 40.1 43.1 41.9  MCV 92.2 91.5 89.9  PLT 262 300 288   Basic Metabolic Panel: Recent Labs  Lab 06/04/19 0943 06/04/19 1950 06/05/19 0410  NA 142  --  141  K 4.3  --  4.3  CL 102  --  99  CO2 28  --  29  GLUCOSE 78  --  199*  BUN 8  --  11  CREATININE 0.79 0.82 0.92  CALCIUM 8.9  --  8.9   GFR: Estimated Creatinine Clearance: 132 mL/min (by C-G formula based on SCr of 0.92 mg/dL). Liver Function Tests: Recent Labs  Lab 06/05/19 0410  AST 24  ALT 19  ALKPHOS 96  BILITOT 1.1  PROT 6.6  ALBUMIN 3.0*   No results for input(s): LIPASE, AMYLASE in the last 168 hours. No results for input(s): AMMONIA in the last 168 hours. Coagulation Profile: No results for input(s): INR, PROTIME in the last 168 hours. Cardiac Enzymes: No results for input(s): CKTOTAL, CKMB, CKMBINDEX, TROPONINI in the last 168 hours.  BNP (last 3 results) No results for input(s): PROBNP in the last 8760 hours. HbA1C: Recent Labs    06/04/19 1821  HGBA1C 8.4*   CBG: Recent Labs  Lab 06/04/19 2130 06/05/19 0612 06/05/19 1130  GLUCAP 89 193* 214*   Lipid Profile: No results for input(s): CHOL, HDL, LDLCALC, TRIG, CHOLHDL, LDLDIRECT in the last 72 hours. Thyroid Function Tests: No results for input(s): TSH, T4TOTAL, FREET4, T3FREE, THYROIDAB in the last 72 hours. Anemia Panel: No results for input(s): VITAMINB12, FOLATE, FERRITIN, TIBC, IRON, RETICCTPCT in the last 72 hours. Sepsis Labs: No results for input(s): PROCALCITON, LATICACIDVEN in the last 168 hours.  Recent Results (from the past 240 hour(s))  SARS CORONAVIRUS 2 (TAT 6-24 HRS) Nasopharyngeal Nasopharyngeal Swab      Status: None   Collection Time: 06/04/19  5:22 PM   Specimen: Nasopharyngeal Swab  Result Value Ref Range Status   SARS Coronavirus 2 NEGATIVE NEGATIVE Final    Comment: (NOTE) SARS-CoV-2 target nucleic acids are NOT DETECTED. The SARS-CoV-2 RNA is generally detectable in upper and lower respiratory specimens during the acute phase of infection. Negative results do not preclude SARS-CoV-2 infection, do not rule out co-infections with other pathogens, and should not be used as the sole basis for treatment or other patient management decisions. Negative results must be combined with clinical observations, patient history, and epidemiological information. The expected result is Negative. Fact Sheet for Patients: SugarRoll.be Fact Sheet for Healthcare Providers: https://www.woods-mathews.com/ This test is not yet approved or cleared by the [place] FDA and  has been authorized for detection and/or diagnosis of SARS-CoV-2 by FDA under an Emergency Use Authorization (EUA). This EUA will remain  in effect (meaning this test can be used) for the duration of the COVID-19 declaration under Section 56 4(b)(1) of the Act, 21 U.S.C. section 360bbb-3(b)(1), unless the authorization is terminated or revoked sooner. Performed at [facility], [employer] [address], [place],  [address]          Radiology Studies: Dg Chest 2 View  Result Date: 06/04/2019 CLINICAL DATA:  Shortness of breath. EXAM: CHEST - 2 VIEW COMPARISON:  08/30/2017. FINDINGS: Cardiac monitoring device noted. Cardiomegaly with bilateral pulmonary interstitial prominence. Findings consistent with CHF. Findings have improved from prior exam. No pleural effusion or pneumothorax. No acute bony abnormality identified. Degenerative change thoracic spine. IMPRESSION: Cardiomegaly with bilateral pulmonary interstitial prominence, improved from prior exam. Findings suggest improving  CHF. Electronically Signed   By: Marcello Moores  Register   On: 06/04/2019 10:06        Scheduled Meds: . atorvastatin  40 mg Oral QPM  . buPROPion  300 mg Oral Daily  . enoxaparin (LOVENOX) injection  90 mg Subcutaneous Q24H  . escitalopram  20 mg Oral QPM  . furosemide  60 mg Intravenous BID  . gabapentin  1,200 mg Oral QHS  . gabapentin  600 mg Oral Q breakfast  . insulin aspart  0-15 Units Subcutaneous TID WC  . insulin aspart  0-5 Units Subcutaneous QHS  . insulin aspart  15 Units Subcutaneous TID WC  . insulin NPH Human  30 Units Subcutaneous BID AC & HS  . lisinopril  10 mg Oral Daily  . pantoprazole  40 mg Oral q morning - 10a   Continuous Infusions: None   LOS: 1 day    Time spent: [place], MD [facility] Pager [phone]   If 7PM-7AM, please contact night-coverage [email or web address] Password [facility] 06/05/2019, 3:40 PM

## 2019-06-05 NOTE — Progress Notes (Signed)
  Echocardiogram 2D Echocardiogram has been performed.  Daniel Valenzuela 06/05/2019, 10:49 AM

## 2019-06-05 NOTE — Plan of Care (Signed)

## 2019-06-05 NOTE — Plan of Care (Signed)
Nutrition Education Note RD working remotely.  RD consulted for nutrition education regarding new onset CHF. RD briefly spoke to patient via phone this morning. RN entered room, pt requested RD to call back later. Per chart review, patient now in Echo Lab. Time permitting, will make third attempt later today.   RD provided "Low Sodium Nutrition Therapy" handout from the [employer]. Handout attached to patient discharge instructions and provides examples on ways to decrease sodium intake in diet. Discourages intake of processed foods and use of salt shaker. Encourages fresh fruits and vegetables as well as whole grain sources of carbohydrates to maximize fiber intake.   Handout discusses why it is important for patient to adhere to diet recommendations, and emphasized the role of fluids, foods to avoid, and importance of weighing self daily.   Expect fair compliance.  Body mass index is 54.91 kg/m. Pt meets criteria for morbid obesity based on current BMI.  Current diet order is HH/CM, no meals documented at this time. Labs and medications reviewed. No further nutrition interventions warranted at this time. RD contact information provided. If additional nutrition issues arise, please re-consult RD.   Lajuan Lines, RD, LDN Clinical Nutrition [facility] Telephone [phone] After Hours/Weekend Pager: [phone]

## 2019-06-06 LAB — GLUCOSE, CAPILLARY
Glucose-Capillary: 133 mg/dL — ABNORMAL HIGH (ref 70–99)
Glucose-Capillary: 271 mg/dL — ABNORMAL HIGH (ref 70–99)
Glucose-Capillary: 135 mg/dL — ABNORMAL HIGH (ref 70–99)
Glucose-Capillary: 101 mg/dL — ABNORMAL HIGH (ref 70–99)

## 2019-06-06 LAB — CBC
HCT: 40.3 % (ref 39.0–52.0)
Hemoglobin: 13 g/dL (ref 13.0–17.0)
MCH: 29.1 pg (ref 26.0–34.0)
MCHC: 32.3 g/dL (ref 30.0–36.0)
MCV: 90.2 fL (ref 80.0–100.0)
Platelets: 267 10*3/uL (ref 150–400)
RBC: 4.47 MIL/uL (ref 4.22–5.81)
RDW: 15.2 % (ref 11.5–15.5)
WBC: 9.5 10*3/uL (ref 4.0–10.5)
nRBC: 0 % (ref 0.0–0.2)

## 2019-06-06 LAB — COMPREHENSIVE METABOLIC PANEL
ALT: 18 U/L (ref 0–44)
AST: 29 U/L (ref 15–41)
Albumin: 2.8 g/dL — ABNORMAL LOW (ref 3.5–5.0)
Alkaline Phosphatase: 92 U/L (ref 38–126)
Anion gap: 8 (ref 5–15)
BUN: 13 mg/dL (ref 8–23)
CO2: 33 mmol/L — ABNORMAL HIGH (ref 22–32)
Calcium: 8.7 mg/dL — ABNORMAL LOW (ref 8.9–10.3)
Chloride: 100 mmol/L (ref 98–111)
Creatinine, Ser: 0.96 mg/dL (ref 0.61–1.24)
GFR calc Af Amer: >60 mL/min (ref >60)
GFR calc non Af Amer: >60 mL/min (ref >60)
Glucose, Bld: 139 mg/dL — ABNORMAL HIGH (ref 70–99)
Potassium: 3.5 mmol/L (ref 3.5–5.1)
Sodium: 141 mmol/L (ref 135–145)
Total Bilirubin: 1.2 mg/dL (ref 0.3–1.2)
Total Protein: 6.2 g/dL — ABNORMAL LOW (ref 6.5–8.1)

## 2019-06-06 LAB — MAGNESIUM: Magnesium: 1.6 mg/dL — ABNORMAL LOW (ref 1.7–2.4)

## 2019-06-06 LAB — PHOSPHORUS: Phosphorus: 5.2 mg/dL — ABNORMAL HIGH (ref 2.5–4.6)

## 2019-06-06 MED ORDER — MAGNESIUM SULFATE 2 GM/50ML IV SOLN
2.0000 g | Freq: Once | INTRAVENOUS | Status: AC
  Administered 2019-06-06: 2 g via INTRAVENOUS
  Filled 2019-06-06: qty 50

## 2019-06-06 MED ORDER — FUROSEMIDE 10 MG/ML IJ SOLN
20.0000 mg | Freq: Once | INTRAMUSCULAR | Status: AC
  Administered 2019-06-06: 20 mg via INTRAVENOUS
  Filled 2019-06-06: qty 2

## 2019-06-06 MED ORDER — FUROSEMIDE 10 MG/ML IJ SOLN
40.0000 mg | Freq: Once | INTRAMUSCULAR | Status: AC
  Administered 2019-06-06: 40 mg via INTRAVENOUS
  Filled 2019-06-06: qty 4

## 2019-06-06 MED ORDER — FUROSEMIDE 10 MG/ML IJ SOLN
80.0000 mg | Freq: Two times a day (BID) | INTRAMUSCULAR | Status: DC
  Administered 2019-06-06 – 2019-06-07 (×2): 80 mg via INTRAVENOUS
  Filled 2019-06-06 (×2): qty 8

## 2019-06-06 MED ORDER — ALBUMIN HUMAN 25 % IV SOLN
25.0000 g | INTRAVENOUS | Status: AC
  Administered 2019-06-06 (×2): 25 g via INTRAVENOUS
  Filled 2019-06-06 (×2): qty 100

## 2019-06-06 MED ORDER — CHLORHEXIDINE GLUCONATE CLOTH 2 % EX PADS
6.0000 | MEDICATED_PAD | Freq: Every day | CUTANEOUS | Status: DC
  Administered 2019-06-06 – 2019-06-12 (×6): 6 via TOPICAL

## 2019-06-06 NOTE — Progress Notes (Signed)
Patient ID: Daniel Valenzuela, male   DOB: 1950-10-11, 68 y.o.   MRN: [id number]  PROGRESS NOTE    Daniel Valenzuela  NHA:[id number] DOB: 04-29-1951 DOA: 06/04/2019 PCP: Carylon Perches, MD   Brief Narrative:  69 year old male with history of insulin-dependent diabetes mellitus type 2, heart failure with reduced ejection fraction (last echocardiogram on January 2020 showed ejection fraction of 35 to 40%, hypertension, hyperlipidemia admitted with acute shortness of breath worsening bilateral and abdominal edema.  Patient endorsed about 40 pounds weight gain.  He admitted to frequent consumption of coffee and beverages, mostly unsweetened tea which has been ongoing for the past several months.  He takes Lasix p.o. 40 mg daily which appears not to be effective as a highly makes much urine after taking Lasix. In the ED, he has elevated BNP, troponin of 110 with a stable creatinine of 0.79. Chest x-ray showed cardiomegaly with bilateral pulmonary interstitial prominence consistent with fluid overload. Echocardiogram done on 06/05/2019 showed estimated ejection fraction of 30 to 35%, when compared with echo of January 2020 showed some reduction in ejection fraction.  There is moderate to severe decreased left ventricular function.  There is severe aortic stenosis with aortic valve area less than 1 cm. Will continue with guideline directed medical therapy and consult cardiology for evaluation.  Patient getting Lasix at 80 mg twice daily.  Nurse reported decreased urine output with 0 urine on bladder scan. Examined patient and performed abdominal compression to induce urge to urinate.  Repeat bladder scan showed some urine in the bladder but with limited scan due to extensive abdominal wall fat. Discussed with patient about inserting a Foley catheter to monitor adequate urine output.  Will give extra 40 mg of Lasix and continue with 80 twice daily.  Will monitor CMP and if worsening renal function, consulted nephrology  for evaluation for renal replacement therapy.     Assessment & Plan:   Principal Problem:   Acute combined systolic and diastolic CHF Active Problems:   Diabetic neuropathy (HCC)   High cholesterol   Shortness of breath   Type 2 diabetes mellitus with complication, with long-term current use of insulin (HCC)   Morbid obesity with BMI of 45.0-49.9, adult (HCC)   CAD (coronary artery disease)   CHF exacerbation (HCC)  Clinical problems list 1.  Acute exacerbation of heart failure with reduced ejection fraction 2.  Diabetes mellitus type 2, with long-term insulin use/diabetic neuropathy 3.  Hyperlipidemia 4.  Essential hypertension 5.  Elevated troponin 6.  Morbid obesity BMI 54.9  1.  Acute exacerbation of heart failure with reduced ejection fraction. Patient presenting with worsening shortness of breath, bilateral pitting pedal edema, abdominal fullness and 40 pound weight gain.  Admitted to increased fluid consumption especially coffee with unsweetened tea. Takes Lasix 40 mg daily at home which appears not to have been effective.  Echocardiogram done in January 2020 showed ejection fraction of 35 to 40% by estimation.  Repeat echocardiogram today was significant for ejection fraction of 30 to 35% with moderate to severe decrease left ventricular function and severe aortic stenosis with aortic valve area less than 1 cm. Will continue with IV Lasix at 80 mg twice daily as blood pressure permits Continue with guideline directed medical therapy with aspirin, high intensity statin, beta-blocker and ACE inhibitor. Hold off on beta-blocker and ACE inhibitor due to low blood pressure.Marland Kitchen  Restart as soon as blood pressure permits. 2 g sodium diet Fluid restriction to 1500 cc per 24 hours Daily  weight Cardiology on board.  2.  Diabetes mellitus type 2, with long-term insulin use/diabetic neuropathy.  Hemoglobin A1c 8.4 Continue with sliding scale insulin Continue long-acting insulin  Mealtime insulin 3 times daily Fingersticks before meals and at bedtime Hypoglycemic protocol Continue with gabapentin  3.  Hyperlipidemia Continue with atorvastatin 80 mg daily  4.  Essential hypertension.  Blood pressure currently running low without compensatory/reflex tachycardia concerning for possible cardiac ischemia. Hold antihypertensives and careful use of Lasix given current hypotension. Reinitiate antihypertensive medication with elevation in blood pressure  5.  Elevated troponin.  High-sensitivity troponin elevated at 110. EKG showed AV conduction delay with T wave inversion in leads V1.  A possible septal infarct.  When compared with EKG of August 10, 2018, appears not to be significant.    5.  Morbid obesity BMI 54.91 Nutritional referral   DVT prophylaxis: Lovenox subcute  Code Status: Full  Family Communication: Discussed with patient  Disposition Plan: Pending clinical improvement    Consultants:   Cardiology  Procedures: 2D echocardiogram 06/05/2019: Ejection fraction 30 to 35% with severely reduced left ventricular function.   Antimicrobials: None    Subjective: Patient is laying quietly in bed, alert and oriented x3.  Morbidly obese not in acute distress.  Denies any chest pain, palpitation or diaphoresis.  Patient has not made urine after receiving 2 doses of 80 mg of Lasix.  Bladder scan was not reliable due to extensive abdominal fat.  Discussed with patient about putting a Foley catheter to monitor input and output.  Patient is agreeable to Foley catheter   Objective: Vitals:   06/06/19 0600 06/06/19 0824 06/06/19 1037 06/06/19 1407  BP:  (!) 89/64 107/67 110/70  Pulse:  (!) 108  94  Resp: 20 20  20   Temp:  97.7 F (36.5 C)  97.6 F (36.4 C)  TempSrc:  Oral  Oral  SpO2:  98%  98%  Weight:      Height:        Intake/Output Summary (Last 24 hours) at 06/06/2019 1746 Last data filed at 06/06/2019 1332 Gross per 24 hour  Intake 1080 ml   Output 900 ml  Net 180 ml   Filed Weights   06/04/19 2322 06/05/19 0437 06/06/19 0501  Weight: (!) 180.2 kg (!) 178.6 kg (!) 172.9 kg    Examination:  General exam: Appears calm and comfortable  Respiratory system: Bilateral mid and basilar crackles.    Respiratory effort normal. Cardiovascular system: S1 & S2 heard, RRR. No JVD, murmurs, rubs, gallops or clicks.  Distance heart sounds due to morbid obesity. Gastrointestinal system: Abdomen is distended, firm to touch and dull to percussion.  Unable to palpate any organ due to abdominal distention.   Central nervous system: Alert and oriented. No focal neurological deficits. Extremities: Symmetric 5 x 5 power.  Bilateral 2+ pitting pedal edema up to mid shin Skin: Multiple ecchymotic areas of lower extremity  Psychiatry: Judgement and insight appear normal. Mood & affect appropriate.     Data Reviewed: I have personally reviewed following labs and imaging studies  CBC: Recent Labs  Lab 06/04/19 0943 06/04/19 1821 06/05/19 0410 06/06/19 0418  WBC 9.0 10.6* 10.8* 9.5  HGB 12.5* 13.5 13.5 13.0  HCT 40.1 43.1 41.9 40.3  MCV 92.2 91.5 89.9 90.2  PLT 262 300 288 267   Basic Metabolic Panel: Recent Labs  Lab 06/04/19 0943 06/04/19 1950 06/05/19 0410 06/06/19 0418  NA 142  --  141 141  K 4.3  --  4.3 3.5  CL 102  --  99 100  CO2 28  --  29 33*  GLUCOSE 78  --  199* 139*  BUN 8  --  11 13  CREATININE 0.79 0.82 0.92 0.96  CALCIUM 8.9  --  8.9 8.7*  MG  --   --   --  1.6*  PHOS  --   --   --  5.2*   GFR: Estimated Creatinine Clearance: 124 mL/min (by C-G formula based on SCr of 0.96 mg/dL). Liver Function Tests: Recent Labs  Lab 06/05/19 0410 06/06/19 0418  AST 24 29  ALT 19 18  ALKPHOS 96 92  BILITOT 1.1 1.2  PROT 6.6 6.2*  ALBUMIN 3.0* 2.8*   No results for input(s): LIPASE, AMYLASE in the last 168 hours. No results for input(s): AMMONIA in the last 168 hours. Coagulation Profile: No results for  input(s): INR, PROTIME in the last 168 hours. Cardiac Enzymes: No results for input(s): CKTOTAL, CKMB, CKMBINDEX, TROPONINI in the last 168 hours. BNP (last 3 results) No results for input(s): PROBNP in the last 8760 hours. HbA1C: Recent Labs    06/04/19 1821  HGBA1C 8.4*   CBG: Recent Labs  Lab 06/05/19 1551 06/05/19 2055 06/06/19 0607 06/06/19 1124 06/06/19 1603  GLUCAP 190* 108* 133* 271* 135*   Lipid Profile: No results for input(s): CHOL, HDL, LDLCALC, TRIG, CHOLHDL, LDLDIRECT in the last 72 hours. Thyroid Function Tests: No results for input(s): TSH, T4TOTAL, FREET4, T3FREE, THYROIDAB in the last 72 hours. Anemia Panel: No results for input(s): VITAMINB12, FOLATE, FERRITIN, TIBC, IRON, RETICCTPCT in the last 72 hours. Sepsis Labs: No results for input(s): PROCALCITON, LATICACIDVEN in the last 168 hours.  Recent Results (from the past 240 hour(s))  SARS CORONAVIRUS 2 (TAT 6-24 HRS) Nasopharyngeal Nasopharyngeal Swab     Status: None   Collection Time: 06/04/19  5:22 PM   Specimen: Nasopharyngeal Swab  Result Value Ref Range Status   SARS Coronavirus 2 NEGATIVE NEGATIVE Final    Comment: (NOTE) SARS-CoV-2 target nucleic acids are NOT DETECTED. The SARS-CoV-2 RNA is generally detectable in upper and lower respiratory specimens during the acute phase of infection. Negative results do not preclude SARS-CoV-2 infection, do not rule out co-infections with other pathogens, and should not be used as the sole basis for treatment or other patient management decisions. Negative results must be combined with clinical observations, patient history, and epidemiological information. The expected result is Negative. Fact Sheet for Patients: HairSlick.no Fact Sheet for Healthcare Providers: quierodirigir.com This test is not yet approved or cleared by the [place] FDA and  has been authorized for detection and/or  diagnosis of SARS-CoV-2 by FDA under an Emergency Use Authorization (EUA). This EUA will remain  in effect (meaning this test can be used) for the duration of the COVID-19 declaration under Section 56 4(b)(1) of the Act, 21 U.S.C. section 360bbb-3(b)(1), unless the authorization is terminated or revoked sooner. Performed at [facility], [address], [place], [place] [address]          Radiology Studies: No results found.      Scheduled Meds: . atorvastatin  80 mg Oral QPM  . buPROPion  300 mg Oral Daily  . enoxaparin (LOVENOX) injection  90 mg Subcutaneous Q24H  . escitalopram  20 mg Oral QPM  . furosemide  80 mg Intravenous BID  . gabapentin  1,200 mg Oral QHS  . gabapentin  600 mg Oral Q breakfast  . insulin aspart  0-15 Units Subcutaneous TID WC  . insulin aspart  0-5 Units Subcutaneous QHS  . insulin aspart  15 Units Subcutaneous TID WC  . insulin NPH Human  30 Units Subcutaneous BID AC & HS  . lisinopril  10 mg Oral Daily  . pantoprazole  40 mg Oral q morning - 10a   Continuous Infusions: None   LOS: 2 days    Time spent: [place], MD [facility] Pager [phone]   If 7PM-7AM, please contact night-coverage [email or web address] Password [facility] 06/06/2019, 5:46 PM

## 2019-06-06 NOTE — Progress Notes (Addendum)
Spoke with PA with cardiology to make her aware that patient has only put out 279ml of urine all day after 80mg  of IV Lasix. Will bladder scan patient to make sure he is not retaining urine.   Patient attempted to void but could not. Bladder scan only showed 20cc of urine.   Per patient he has not had any urine output today, not even the 200cc that was recorded this morning. I paged cardiology to inform them, awaiting call back. Informed Dr. Junious Dresser and he is going to check into this issue.

## 2019-06-06 NOTE — Progress Notes (Addendum)
Spoke with Dr. Junious Dresser to make sure he wants to place a foley rather than in and out cath the patient per policy. He definitely wants a foley placed for 1 day. The patient has not voided in 24 hours after 2 doses of IV Lasix and the bladder scan is only showing 20cc of urine. He is not sure if this correct due to the patient's anatomy. Will proceed with placing foley cath.   Dr Rockie Neighbours made aware that 1150cc urine came out after placing foley.

## 2019-06-06 NOTE — Progress Notes (Signed)
    Patient's nurse called to report he had minimal urine output following administration of AM Lasix. Lasix was increased to 80mg  BID today. Recommended obtaining a bladder scan. If issues with urinary retention, will place catheter. Creatinine stable at 0.96 this AM. Will repeat BMET in AM.   Signed, Erma Heritage, PA-C 06/06/2019, 1:23 PM Pager: [phone]

## 2019-06-06 NOTE — Progress Notes (Signed)
Progress Note  Patient Name: Daniel Valenzuela Date of Encounter: 06/06/2019  Primary Cardiologist: Lewayne Bunting, MD   Subjective   Ongoing SOB  Inpatient Medications    Scheduled Meds: . atorvastatin  80 mg Oral QPM  . buPROPion  300 mg Oral Daily  . enoxaparin (LOVENOX) injection  90 mg Subcutaneous Q24H  . escitalopram  20 mg Oral QPM  . furosemide  60 mg Intravenous BID  . gabapentin  1,200 mg Oral QHS  . gabapentin  600 mg Oral Q breakfast  . insulin aspart  0-15 Units Subcutaneous TID WC  . insulin aspart  0-5 Units Subcutaneous QHS  . insulin aspart  15 Units Subcutaneous TID WC  . insulin NPH Human  30 Units Subcutaneous BID AC & HS  . lisinopril  10 mg Oral Daily  . pantoprazole  40 mg Oral q morning - 10a   Continuous Infusions: . magnesium sulfate bolus IVPB     PRN Meds: HYDROcodone-acetaminophen   Vital Signs    Vitals:   06/06/19 0501 06/06/19 0600 06/06/19 0824 06/06/19 1037  BP: 102/67  (!) 89/64 107/67  Pulse: (!) 110  (!) 108   Resp: 20 20 20    Temp: 97.6 F (36.4 C)  97.7 F (36.5 C)   TempSrc: Oral  Oral   SpO2: 99%  98%   Weight: (!) 172.9 kg     Height:        Intake/Output Summary (Last 24 hours) at 06/06/2019 1104 Last data filed at 06/06/2019 0900 Gross per 24 hour  Intake 1440 ml  Output 1400 ml  Net 40 ml   Last 3 Weights 06/06/2019 06/05/2019 06/04/2019  Weight (lbs) 381 lb 1.6 oz 393 lb 11.2 oz 397 lb 3.2 oz  Weight (kg) 172.866 kg 178.581 kg 180.169 kg      Telemetry    Sinus tach, first degree AV block - Personally Reviewed  ECG    n/a - Personally Reviewed  Physical Exam   GEN: No acute distress.   Neck: No JVD Cardiac: RRR, no murmurs, rubs, or gallops.  Respiratory: Clear to auscultation bilaterally. GI: Soft, nontender, non-distended  MS: 1+ bialteral LE edema; No deformity. Neuro:  Nonfocal  Psych: Normal affect   Labs    High Sensitivity Troponin:   Recent Labs  Lab 06/04/19 0943 06/04/19 1331   TROPONINIHS 109* 110*      Chemistry Recent Labs  Lab 06/04/19 0943 06/04/19 1950 06/05/19 0410 06/06/19 0418  NA 142  --  141 141  K 4.3  --  4.3 3.5  CL 102  --  99 100  CO2 28  --  29 33*  GLUCOSE 78  --  199* 139*  BUN 8  --  11 13  CREATININE 0.79 0.82 0.92 0.96  CALCIUM 8.9  --  8.9 8.7*  PROT  --   --  6.6 6.2*  ALBUMIN  --   --  3.0* 2.8*  AST  --   --  24 29  ALT  --   --  19 18  ALKPHOS  --   --  96 92  BILITOT  --   --  1.1 1.2  GFRNONAA >60 >60 >60 >60  GFRAA >60 >60 >60 >60  ANIONGAP 12  --  13 8     Hematology Recent Labs  Lab 06/04/19 1821 06/05/19 0410 06/06/19 0418  WBC 10.6* 10.8* 9.5  RBC 4.71 4.66 4.47  HGB 13.5 13.5 13.0  HCT  43.1 41.9 40.3  MCV 91.5 89.9 90.2  MCH 28.7 29.0 29.1  MCHC 31.3 32.2 32.3  RDW 15.3 15.3 15.2  PLT 300 288 267    BNP Recent Labs  Lab 06/04/19 1331  BNP 502.7*     DDimer No results for input(s): DDIMER in the last 168 hours.   Radiology    No results found.  Cardiac Studies     Patient Profile      Daniel Valenzuela is a 68 y.o. male with a hx of chronic sysotlic HF and aortic stenosis who is being seen today for the evaluation of SOB at the request of Dr .Eliott Nine  Assessment & Plan    1. Acute on chronic systolic HF - massive weight gain, about 40 lbs since July - echo this admit LVEF 30-35%, severe AS. In Jan 2020 LVEF 35-40%, mod AS Jan 2020 cath mild to mod CAD  - negative 68m L yesterday though I/Os incomplete from night shift, and neg 3.9 L since admission, he is on lasix 60mg  IV bid. Limited objective date on I/Os, subjectivley he does not report much uop yesterday, increase lasix to 80mg  bid.   - continue IV diuresis, going to take quite some time. Net negative 2-3 L per day would be our goal - medical therapy with lisinopril 10mg . Im not sure what happened to his toprol, I dont see any documentation that it was to be discontinued. Does not have on his list. Hold at this time since  decompensated, on tele pretty long 1st degree av block as well.   - soft bp's at tmes, monitor. May transition to entresto this admission, would require ACEI washout.   2. Aortic stenosis AV mean grade 35,  AVA VTI 0.73, dimensionless index 0.2. Just misses mean gradient critiera in setting of low LVEF, fair to say his AS is indeed severe.  - will need to be evaluated by structural heart team once he is closer to euvolemic.       For questions or updates, please contact [place] Please consult [email or web address] for contact info under        Signed, Carlyle Dolly, MD  06/06/2019, 11:04 AM

## 2019-06-07 ENCOUNTER — Encounter (HOSPITAL_COMMUNITY): Payer: Self-pay | Admitting: Cardiology

## 2019-06-07 DIAGNOSIS — I5041 Acute combined systolic (congestive) and diastolic (congestive) heart failure: Secondary | ICD-10-CM

## 2019-06-07 LAB — URINALYSIS, ROUTINE W REFLEX MICROSCOPIC
Bacteria, UA: NONE SEEN
Bilirubin Urine: NEGATIVE
Glucose, UA: NEGATIVE mg/dL
Ketones, ur: NEGATIVE mg/dL
Nitrite: NEGATIVE
Protein, ur: NEGATIVE mg/dL
RBC / HPF: >50 RBC/hpf — ABNORMAL HIGH (ref 0–5)
Specific Gravity, Urine: 1.011 (ref 1.005–1.030)
pH: 5 (ref 5.0–8.0)

## 2019-06-07 LAB — COMPREHENSIVE METABOLIC PANEL
ALT: 16 U/L (ref 0–44)
AST: 26 U/L (ref 15–41)
Albumin: 3.1 g/dL — ABNORMAL LOW (ref 3.5–5.0)
Alkaline Phosphatase: 78 U/L (ref 38–126)
Anion gap: 11 (ref 5–15)
BUN: 18 mg/dL (ref 8–23)
CO2: 32 mmol/L (ref 22–32)
Calcium: 8.9 mg/dL (ref 8.9–10.3)
Chloride: 99 mmol/L (ref 98–111)
Creatinine, Ser: 1.03 mg/dL (ref 0.61–1.24)
GFR calc Af Amer: >60 mL/min (ref >60)
GFR calc non Af Amer: >60 mL/min (ref >60)
Glucose, Bld: 126 mg/dL — ABNORMAL HIGH (ref 70–99)
Potassium: 3.9 mmol/L (ref 3.5–5.1)
Sodium: 142 mmol/L (ref 135–145)
Total Bilirubin: 1 mg/dL (ref 0.3–1.2)
Total Protein: 6 g/dL — ABNORMAL LOW (ref 6.5–8.1)

## 2019-06-07 LAB — CBC
HCT: 38.4 % — ABNORMAL LOW (ref 39.0–52.0)
Hemoglobin: 12.3 g/dL — ABNORMAL LOW (ref 13.0–17.0)
MCH: 28.9 pg (ref 26.0–34.0)
MCHC: 32 g/dL (ref 30.0–36.0)
MCV: 90.4 fL (ref 80.0–100.0)
Platelets: 246 10*3/uL (ref 150–400)
RBC: 4.25 MIL/uL (ref 4.22–5.81)
RDW: 15.4 % (ref 11.5–15.5)
WBC: 10.8 10*3/uL — ABNORMAL HIGH (ref 4.0–10.5)
nRBC: 0 % (ref 0.0–0.2)

## 2019-06-07 LAB — PHOSPHORUS: Phosphorus: 4.9 mg/dL — ABNORMAL HIGH (ref 2.5–4.6)

## 2019-06-07 LAB — GLUCOSE, CAPILLARY
Glucose-Capillary: 131 mg/dL — ABNORMAL HIGH (ref 70–99)
Glucose-Capillary: 166 mg/dL — ABNORMAL HIGH (ref 70–99)
Glucose-Capillary: 104 mg/dL — ABNORMAL HIGH (ref 70–99)
Glucose-Capillary: 97 mg/dL (ref 70–99)

## 2019-06-07 LAB — MAGNESIUM: Magnesium: 1.8 mg/dL (ref 1.7–2.4)

## 2019-06-07 MED ORDER — PANTOPRAZOLE SODIUM 40 MG PO TBEC
40.0000 mg | DELAYED_RELEASE_TABLET | Freq: Two times a day (BID) | ORAL | Status: DC
  Administered 2019-06-07 – 2019-06-13 (×12): 40 mg via ORAL
  Filled 2019-06-07 (×12): qty 1

## 2019-06-07 MED ORDER — FUROSEMIDE 10 MG/ML IJ SOLN
80.0000 mg | Freq: Three times a day (TID) | INTRAMUSCULAR | Status: DC
  Administered 2019-06-07 – 2019-06-12 (×15): 80 mg via INTRAVENOUS
  Filled 2019-06-07 (×15): qty 8

## 2019-06-07 NOTE — Plan of Care (Signed)

## 2019-06-07 NOTE — Progress Notes (Addendum)
Progress Note  Patient Name: Daniel Valenzuela Date of Encounter: 06/07/2019  Primary Cardiologist: Lewayne Bunting, MD   Subjective   Multiple issues, no angina, + SOB having to sit up partly due to SOB and partly due to regurg.  Which he has at home.  Urinary retention now with foley cath.   Toe Lt great toe with bleeding now dressed.   Inpatient Medications    Scheduled Meds: . atorvastatin  80 mg Oral QPM  . buPROPion  300 mg Oral Daily  . Chlorhexidine Gluconate Cloth  6 each Topical Daily  . enoxaparin (LOVENOX) injection  90 mg Subcutaneous Q24H  . escitalopram  20 mg Oral QPM  . furosemide  80 mg Intravenous BID  . gabapentin  1,200 mg Oral QHS  . gabapentin  600 mg Oral Q breakfast  . insulin aspart  0-15 Units Subcutaneous TID WC  . insulin aspart  0-5 Units Subcutaneous QHS  . insulin aspart  15 Units Subcutaneous TID WC  . insulin NPH Human  30 Units Subcutaneous BID AC & HS  . lisinopril  10 mg Oral Daily  . pantoprazole  40 mg Oral q morning - 10a   Continuous Infusions:  PRN Meds: HYDROcodone-acetaminophen   Vital Signs    Vitals:   06/06/19 2011 06/07/19 0524 06/07/19 0600 06/07/19 0824  BP: 101/81 (!) 96/59  106/64  Pulse: 84 88    Resp: 20 18 20    Temp: 97.8 F (36.6 C) 97.7 F (36.5 C)    TempSrc: Oral Oral    SpO2: 97% 98%    Weight:  (!) 176 kg    Height:        Intake/Output Summary (Last 24 hours) at 06/07/2019 0851 Last data filed at 06/07/2019 0831 Gross per 24 hour  Intake 2230 ml  Output 750 ml  Net 1480 ml   Last 3 Weights 06/07/2019 06/06/2019 06/05/2019  Weight (lbs) 387 lb 14.4 oz 381 lb 1.6 oz 393 lb 11.2 oz  Weight (kg) 175.95 kg 172.866 kg 178.581 kg      Telemetry    Believe sR with 1st degree AV block - Personally Reviewed  ECG    No new will order - Personally Reviewed  Physical Exam   GEN: No acute distress.   Neck: + JVD Cardiac: RRR, distant heart sounds unable to hear if  murmurs, rubs, or gallops.   Respiratory: Clear to mostly diminished breath sounds to auscultation bilaterally. GI: Soft, nontender, non-distended -obese, he feels fluid still present.    MS: 1+ edema; No deformity. Lt great toe wrapped  Neuro:  Nonfocal  Psych: Normal affect   Labs    High Sensitivity Troponin:   Recent Labs  Lab 06/04/19 0943 06/04/19 1331  TROPONINIHS 109* 110*      Chemistry Recent Labs  Lab 06/05/19 0410 06/06/19 0418 06/07/19 0450  NA 141 141 142  K 4.3 3.5 3.9  CL 99 100 99  CO2 29 33* 32  GLUCOSE 199* 139* 126*  BUN 11 13 18   CREATININE 0.92 0.96 1.03  CALCIUM 8.9 8.7* 8.9  PROT 6.6 6.2* 6.0*  ALBUMIN 3.0* 2.8* 3.1*  AST 24 29 26   ALT 19 18 16   ALKPHOS 96 92 78  BILITOT 1.1 1.2 1.0  GFRNONAA >60 >60 >60  GFRAA >60 >60 >60  ANIONGAP 13 8 11      Hematology Recent Labs  Lab 06/05/19 0410 06/06/19 0418 06/07/19 0450  WBC 10.8* 9.5 10.8*  RBC 4.66  4.47 4.25  HGB 13.5 13.0 12.3*  HCT 41.9 40.3 38.4*  MCV 89.9 90.2 90.4  MCH 29.0 29.1 28.9  MCHC 32.2 32.3 32.0  RDW 15.3 15.2 15.4  PLT 288 267 246    BNP Recent Labs  Lab 06/04/19 1331  BNP 502.7*     DDimer No results for input(s): DDIMER in the last 168 hours.   Radiology    No results found.  Cardiac Studies   Echo 06/05/19 IMPRESSIONS    1. Left ventricular ejection fraction, by visual estimation, is 30 to 35%. The left ventricle has moderate to severely decreased function. There is no left ventricular hypertrophy.  2. Definity contrast agent was given IV to delineate the left ventricular endocardial borders.  3. Left ventricular diastolic parameters are indeterminate.  4. Moderately dilated left ventricular internal cavity size.  5. Diffuse hypokinesis worse in the apex and septum.  6. Global right ventricle has normal systolic function.The right ventricular size is normal. No increase in right ventricular wall thickness.  7. Left atrial size was moderately dilated.  8. Right atrial size  was not well visualized.  9. Moderate mitral annular calcification. 10. Severe calcification of the mitral valve leaflet(s). 11. Severe thickening of the mitral valve leaflet(s). 12. The mitral valve is degenerative. Trace mitral valve regurgitation. 13. The tricuspid valve is normal in structure. Tricuspid valve regurgitation is mild. 14. The aortic valve is tricuspid. Aortic valve regurgitation is not visualized. Severe aortic valve stenosis. 15. Severely calcified with restricted leaflet motion Likely low flow severe AS DVI 0.20 mean gradient increased from 28 mmHg to 35 mmHg peak increased from 47 to 51 mmHg since January. 16. The pulmonic valve was grossly normal. Pulmonic valve regurgitation is mild. 17. The interatrial septum was not well visualized.  FINDINGS  Left Ventricle: Left ventricular ejection fraction, by visual estimation, is 30 to 35%. The left ventricle has moderate to severely decreased function. Definity contrast agent was given IV to delineate the left ventricular endocardial borders. The left  ventricular internal cavity size was moderately dilated left ventricle. There is no left ventricular hypertrophy. Left ventricular diastolic parameters are indeterminate. Diffuse hypokinesis worse in the apex and septum.  Right Ventricle: The right ventricular size is normal. No increase in right ventricular wall thickness. Global RV systolic function is has normal systolic function.  Left Atrium: Left atrial size was moderately dilated.  Right Atrium: Right atrial size was not well visualized  Pericardium: There is no evidence of pericardial effusion.  Mitral Valve: The mitral valve is degenerative in appearance. There is severe thickening of the mitral valve leaflet(s). There is severe calcification of the mitral valve leaflet(s). Moderate mitral annular calcification. Trace mitral valve  regurgitation.  Tricuspid Valve: The tricuspid valve is normal in structure.  Tricuspid valve regurgitation is mild.  Aortic Valve: The aortic valve is tricuspid. Aortic valve regurgitation is not visualized. Severe aortic stenosis is present. Aortic valve mean gradient measures 35.0 mmHg. Aortic valve peak gradient measures 50.7 mmHg. Aortic valve area, by VTI measures  0.84 cm. Severely calcified with restricted leaflet motion Likely low flow severe AS DVI 0.20 mean gradient increased from 28 mmHg to 35 mmHg peak increased from 47 to 51 mmHg since January.  Pulmonic Valve: The pulmonic valve was grossly normal. Pulmonic valve regurgitation is mild.  Aorta: The aortic root is normal in size and structure.  IAS/Shunts: The interatrial septum was not well visualized.       Patient Profile  67 y.o. male with a hx of chronic sysotlic HF and aortic stenosis, non obstructive disease by cath 07/2018, hx of syncope, OSA, DM now admitted with acute systolic HF.   Assessment & Plan    1. Acute on chronic systolic HF - massive weight gain, about 40 lbs since July - echo this admit LVEF 30-35%, severe AS. In Jan 2020 LVEF 35-40%, mod AS Jan 2020 cath mild to mod CAD  - +1120 L yesterday though I/Os incomplete at times and neg 2.4 L since admission, he is on lasix 60mg  IV bid. Limited objective date on I/Os, subjectivley he does not report much uop yesterday, increased lasix to 80mg  bid.  but was + yesterday if accurate.  --wt down from 180.2 Kg on admit to 176 Kg. Down 9.2 Lbs total.  ? Add metolazone   - continue IV diuresis, going to take quite some time. Net negative 2-3 L per day would be our goal - medical therapy with lisinopril 10mg . Im not sure what happened to his toprol, I dont see any documentation that it was to be discontinued. Does not have on his list. Hold at this time since decompensated, on tele pretty long 1st degree av block as well.   - soft bp's at tmes, monitor. May transition to entresto this admission, would require ACEI washout.    2. Aortic stenosis AV mean grade 35, AVA VTI 0.73, dimensionless index 0.2. Just misses mean gradient critiera in setting of low LVEF, fair to say his AS is indeed severe.  And increased since Jan 2020 - will need to be evaluated by structural heart team once he is closer to euvolemic.  3.  Hx of syncope with Loop recorder.    4. LBBB chronic  5.  Probable SR with long 1st degree AV block difficult to see on tele, will check EKG  6  OSA with CPAP will order for use here  7.  GERD requiring pt to sit up and liquid returns will increase protonix to BID   8.  Toe bleed with prior toenail removal and in diabetic, will have wound care to see.  9.  Urinary retention with foley, check urinalysis,  Per primary team           For questions or updates, please contact [facility] Please consult [email or web address] for contact info under        Signed, Nada Boozer, NP  06/07/2019, 8:51 AM

## 2019-06-07 NOTE — Consult Note (Signed)
[facility] Nurse wound consult note Please place an image in the patient's chart for the  XXXXX wound, the [facility] nurses are performing telehealth consultation for COVID-19 rule outs and COVID-19+ patients.  Thank you.  I will follow up with my recommendations/orders once I have reviewed the patient's chart, images and potentially spoken with the bedside nurse.  Reason for Consult: Skin tear left great toe After speaking with patient he recently had a nail plate avulsion on the left great to podiatry due trauma. After removal the toenail grew back and has "not been right" he reported bleeding with him being on "blood thinners"   Wound type: fungal nail s/p nail plate avulsion  Pressure Injury POA: NA Measurement:NA Wound bed:NA Drainage (amount, consistency, odor) bloody from lateral aspect of the nail bed Periwound: mild edema; palpable distal pulses  Dressing procedure/placement/frequency: Non adherent dressing with antibacterial properties. I have also added a topical with hemostatic properties if needed for any other bleeding  If infection suspected would need imaging of the toe  Would consider podiatry consult if concerned further    Discussed POC with patient and bedside nurse.  Re consult if needed, will not follow at this time. Thanks  Machele Deihl [employer], RN,CWOCN, CNS, [place] [phone])

## 2019-06-07 NOTE — Progress Notes (Addendum)
Notify provider pt has skin tear on left great toewhen walk in room, skin open , 1 cm, stop bleeding, dressing apply on. Wound care nurse consult.Con tinue monitor

## 2019-06-07 NOTE — Progress Notes (Signed)
Patient ID: MERVIL WACKER, male   DOB: [birth year], 68 y.o.   MRN: [id number]  PROGRESS NOTE    SHONDALE QUINLEY  VHQ:[id number] DOB: 1950/08/22 DOA: 06/04/2019 PCP: Carylon Perches, MD   Brief Narrative:  68 year old male with history of insulin-dependent diabetes mellitus type 2, heart failure with reduced ejection fraction (last echocardiogram on January 2020 showed ejection fraction of 35 to 40%, hypertension, hyperlipidemia admitted with acute shortness of breath worsening bilateral and abdominal edema.  Patient endorsed about 40 pounds weight gain.  He admitted to frequent consumption of coffee and beverages, mostly unsweetened tea which has been ongoing for the past several months.  He takes Lasix p.o. 40 mg daily which appears not to be effective as a highly makes much urine after taking Lasix. In the ED, he has elevated BNP, troponin of 110 with a stable creatinine of 0.79. Chest x-ray showed cardiomegaly with bilateral pulmonary interstitial prominence consistent with fluid overload. Echocardiogram done on 06/05/2019 showed estimated ejection fraction of 30 to 35%, when compared with echo of January 2020 showed some reduction in ejection fraction.  There is moderate to severe decreased left ventricular function.  There is severe aortic stenosis with aortic valve area less than 1 cm. Will continue with guideline directed medical therapy and consult cardiology for evaluation.  Patient getting Lasix at 80 mg twice daily but now being increased to 80 mg 3 times daily.   Assessment & Plan:   Principal Problem:   Acute combined systolic and diastolic CHF Active Problems:   Diabetic neuropathy (HCC)   High cholesterol   Shortness of breath   Type 2 diabetes mellitus with complication, with long-term current use of insulin (HCC)   Morbid obesity with BMI of 45.0-49.9, adult (HCC)   CAD (coronary artery disease)   CHF exacerbation (HCC)  Clinical problems list 1.  Acute exacerbation of heart  failure with reduced ejection fraction 2.  Diabetes mellitus type 2, with long-term insulin use/diabetic neuropathy 3.  Hyperlipidemia 4.  Essential hypertension 5.  Elevated troponin 6.  Morbid obesity BMI 54.9  1.  Acute exacerbation of heart failure with reduced ejection fraction: Patient presenting with worsening shortness of breath, bilateral pitting pedal edema, abdominal fullness and 40 pound weight gain.  Admitted to increased fluid consumption especially coffee with unsweetened tea. Takes Lasix 40 mg daily at home which appears not to have been effective.  Echocardiogram done in January 2020 showed ejection fraction of 35 to 40% by estimation.  Repeat echocardiogram today was significant for ejection fraction of 30 to 35% with moderate to severe decrease left ventricular function and severe aortic stenosis with aortic valve area less than 1 cm. -Will continue with IV Lasix but cardiology would like to increase from 80 mg twice daily to 80 mg 3 times daily -Hold off on beta-blocker due to conduction disease and decompensated state of congestive heart failure.  ACE inhibitor has been restarted but will monitor closely given the patient's severe aortic stenosis -2 g sodium diet -Fluid restriction to 1500 cc per 24 hours -Daily weight -Cardiology on board.  2.  Diabetes mellitus type 2, with long-term insulin use/diabetic neuropathy.  Hemoglobin A1c 8.4 -Continue with sliding scale insulin -Continue long-acting insulin -Mealtime insulin 3 times daily -Fingersticks before meals and at bedtime -Hypoglycemic protocol -Continue with gabapentin  3.  Hyperlipidemia Continue with atorvastatin 80 mg daily  4.  Essential hypertension.  Blood pressure currently running low without compensatory/reflex tachycardia concerning for possible cardiac ischemia.  5.  Elevated troponin.  High-sensitivity troponin elevated at 110. EKG showed AV conduction delay with T wave inversion in leads V1.  A  possible septal infarct.  When compared with EKG of August 10, 2018, appears not to be significant.  6.  Morbid obesity BMI 54.91 Nutritional referral   DVT prophylaxis: Lovenox subcute  Code Status: Full  Family Communication: Discussed with patient  Disposition Plan: Pending clinical improvement    Consultants:   Cardiology  Procedures: 2D echocardiogram 06/05/2019: Ejection fraction 30 to 35% with severely reduced left ventricular function.   Antimicrobials: None    Subjective: Patient endorses some mild orthopnea but denies cough.  He states his leg swelling is minimal.  However he is very aware that he still has quite a bit of volume on board.   Objective: Vitals:   06/07/19 0600 06/07/19 0824 06/07/19 0900 06/07/19 1123  BP:  106/64  (!) 100/56  Pulse:    97  Resp: 20   20  Temp:    97.9 F (36.6 C)  TempSrc:    Oral  SpO2:   98% 97%  Weight:      Height:        Intake/Output Summary (Last 24 hours) at 06/07/2019 1526 Last data filed at 06/07/2019 1315 Gross per 24 hour  Intake 2110 ml  Output 1500 ml  Net 610 ml   Filed Weights   06/05/19 0437 06/06/19 0501 06/07/19 0524  Weight: (!) 178.6 kg (!) 172.9 kg (!) 176 kg    Examination:  General exam: Appears calm and comfortable  Respiratory system: Distant lung sounds.  Respiratory effort normal. Cardiovascular system: S1 & S2 heard, RRR. +JVD, no murmurs, rubs, gallops or clicks.  Distant heart sounds due to morbid obesity. Gastrointestinal system: Abdomen is obese soft and nontender.  Unable to palpate any organ due to abdominal distention.   Central nervous system: Alert and oriented. No focal neurological deficits. Extremities: Symmetric 5 x 5 power.  Bilateral 2+ pitting pedal edema up to mid shin Skin: Multiple ecchymotic areas of lower extremity  Psychiatry: Judgement and insight appear normal. Mood & affect appropriate.    LOS: 3 days    Time spent: [place], MD [facility]  If 7PM-7AM, please contact night-coverage [email or web address] Password [facility] 06/07/2019, 3:26 PM

## 2019-06-08 ENCOUNTER — Inpatient Hospital Stay (HOSPITAL_COMMUNITY): Payer: Medicare Other

## 2019-06-08 ENCOUNTER — Encounter (HOSPITAL_COMMUNITY): Payer: Self-pay | Admitting: Physician Assistant

## 2019-06-08 DIAGNOSIS — Z6841 Body Mass Index (BMI) 40.0 and over, adult: Secondary | ICD-10-CM

## 2019-06-08 DIAGNOSIS — I5041 Acute combined systolic (congestive) and diastolic (congestive) heart failure: Secondary | ICD-10-CM

## 2019-06-08 DIAGNOSIS — R0602 Shortness of breath: Secondary | ICD-10-CM

## 2019-06-08 DIAGNOSIS — Z01810 Encounter for preprocedural cardiovascular examination: Secondary | ICD-10-CM

## 2019-06-08 LAB — GLUCOSE, CAPILLARY
Glucose-Capillary: 128 mg/dL — ABNORMAL HIGH (ref 70–99)
Glucose-Capillary: 181 mg/dL — ABNORMAL HIGH (ref 70–99)
Glucose-Capillary: 133 mg/dL — ABNORMAL HIGH (ref 70–99)
Glucose-Capillary: 60 mg/dL — ABNORMAL LOW (ref 70–99)
Glucose-Capillary: 89 mg/dL (ref 70–99)

## 2019-06-08 LAB — BASIC METABOLIC PANEL
Anion gap: 13 (ref 5–15)
BUN: 18 mg/dL (ref 8–23)
CO2: 28 mmol/L (ref 22–32)
Calcium: 8.6 mg/dL — ABNORMAL LOW (ref 8.9–10.3)
Chloride: 100 mmol/L (ref 98–111)
Creatinine, Ser: 0.97 mg/dL (ref 0.61–1.24)
GFR calc Af Amer: >60 mL/min (ref >60)
GFR calc non Af Amer: >60 mL/min (ref >60)
Glucose, Bld: 123 mg/dL — ABNORMAL HIGH (ref 70–99)
Potassium: 3.4 mmol/L — ABNORMAL LOW (ref 3.5–5.1)
Sodium: 141 mmol/L (ref 135–145)

## 2019-06-08 MED ORDER — TAMSULOSIN HCL 0.4 MG PO CAPS
0.4000 mg | ORAL_CAPSULE | Freq: Every day | ORAL | Status: DC
  Administered 2019-06-08 – 2019-06-12 (×5): 0.4 mg via ORAL
  Filled 2019-06-08 (×5): qty 1

## 2019-06-08 MED ORDER — POTASSIUM CHLORIDE CRYS ER 20 MEQ PO TBCR
40.0000 meq | EXTENDED_RELEASE_TABLET | Freq: Once | ORAL | Status: AC
  Administered 2019-06-08: 11:00:00 40 meq via ORAL
  Filled 2019-06-08: qty 2

## 2019-06-08 NOTE — Evaluation (Signed)
Physical Therapy Evaluation Patient Details Name: MILTON SAGONA MRN: [id number] DOB: 24-Dec-1950 Today's Date: 06/08/2019   History of Present Illness  68 year old male with history of insulin-dependent diabetes mellitus type 2, heart failure with reduced ejection fraction (last echocardiogram on January 2020 showed ejection fraction of 35 to 40%, hypertension, hyperlipidemia admitted with acute shortness of breath worsening bilateral and abdominal edema  Clinical Impression  Pt demonstrates deficits in balance, gait, functional mobility, power, and endurance, although he does not appear to be far from his baseline. Pt is able to ambulate household distances with use of RW, demonstrating increased sway and balance deviations. Pt with noted increased work of breathing during ambulation although SpO2 remains stable. Pt with increased balance deviations during turns. Pt will benefit from continued acute PT services to reduce falls risk and restore independence in mobility. Pt will benefit from outpatient cardiopulmonary rehab upon discharge to improve activity tolerance.    Follow Up Recommendations Outpatient PT(cardiopulmonary rehab)    Equipment Recommendations  None recommended by PT(owns necessary DME)    Recommendations for Other Services       Precautions / Restrictions Precautions Precautions: Fall Restrictions Weight Bearing Restrictions: No      Mobility  Bed Mobility Overal bed mobility: Needs Assistance Bed Mobility: Sit to Supine       Sit to supine: Supervision      Transfers Overall transfer level: Needs assistance Equipment used: Rolling walker (2 wheeled) Transfers: Sit to/from Stand Sit to Stand: Supervision            Ambulation/Gait Ambulation/Gait assistance: Supervision Gait Distance (Feet): 60 Feet Assistive device: Rolling walker (2 wheeled) Gait Pattern/deviations: Step-through pattern;Wide base of support Gait velocity: reduced Gait  velocity interpretation: 1.31 - 2.62 ft/sec, indicative of limited community ambulator General Gait Details: pt with shortened step through gait with widened BOS, increased lateral trunk sway  Stairs            Wheelchair Mobility    Modified Rankin (Stroke Patients Only)       Balance Overall balance assessment: Needs assistance Sitting-balance support: No upper extremity supported;Feet supported Sitting balance-Leahy Scale: Good     Standing balance support: Bilateral upper extremity supported Standing balance-Leahy Scale: Good Standing balance comment: supervision of 1 person for static and limited dynamic balance                             Pertinent Vitals/Pain Pain Assessment: No/denies pain    Home Living Family/patient expects to be discharged to:: Private residence Living Arrangements: Spouse/significant other Available Help at Discharge: Family;Available 24 hours/day Type of Home: House Home Access: Stairs to enter Entrance Stairs-Rails: Can reach both Entrance Stairs-Number of Steps: 3 Home Layout: Multi-level Home Equipment: Walker - 2 wheels;Walker - 4 wheels;Cane - single point      Prior Function Level of Independence: Independent with assistive device(s)         Comments: pt ambulates household distances furniture surfing and utilizes cane for limited community distances, requiring frequent breaks     Hand Dominance        Extremity/Trunk Assessment   Upper Extremity Assessment Upper Extremity Assessment: Overall WFL for tasks assessed    Lower Extremity Assessment Lower Extremity Assessment: RLE deficits/detail;LLE deficits/detail RLE Sensation: decreased light touch;history of peripheral neuropathy LLE Sensation: decreased light touch;history of peripheral neuropathy    Cervical / Trunk Assessment Cervical / Trunk Assessment: Normal  Communication   Communication: No  difficulties  Cognition Arousal/Alertness:  Awake/alert Behavior During Therapy: WFL for tasks assessed/performed Overall Cognitive Status: Within Functional Limits for tasks assessed                                        General Comments General comments (skin integrity, edema, etc.): VSS, elevated HR into 120s with ambulation, SpO2 remains stable at 97% at end of ambulation    Exercises     Assessment/Plan    PT Assessment Patient needs continued PT services  PT Problem List Decreased activity tolerance;Decreased balance;Decreased mobility;Decreased knowledge of use of DME;Cardiopulmonary status limiting activity       PT Treatment Interventions DME instruction;Gait training;Stair training;Functional mobility training;Therapeutic activities;Therapeutic exercise;Balance training;Neuromuscular re-education;Patient/family education    PT Goals (Current goals can be found in the Care Plan section)  Acute Rehab PT Goals Patient Stated Goal: To improve activity tolerance PT Goal Formulation: With patient Time For Goal Achievement: 06/22/19 Potential to Achieve Goals: Good    Frequency Min 3X/week   Barriers to discharge        Co-evaluation               AM-PAC PT "6 Clicks" Mobility  Outcome Measure Help needed turning from your back to your side while in a flat bed without using bedrails?: None Help needed moving from lying on your back to sitting on the side of a flat bed without using bedrails?: None Help needed moving to and from a bed to a chair (including a wheelchair)?: None Help needed standing up from a chair using your arms (e.g., wheelchair or bedside chair)?: None Help needed to walk in hospital room?: None Help needed climbing 3-5 steps with a railing? : A Little 6 Click Score: 23    End of Session Equipment Utilized During Treatment: (none) Activity Tolerance: Patient limited by fatigue Patient left: in bed;with call bell/phone within reach Nurse Communication: Mobility  status PT Visit Diagnosis: Other abnormalities of gait and mobility (R26.89)    Time: 4481-8563 PT Time Calculation (min) (ACUTE ONLY): 15 min   Charges:   PT Evaluation $PT Eval Low Complexity: 1 Low          Arlyss Gandy, PT, DPT Acute Rehabilitation Pager: [phone]   Arlyss Gandy 06/08/2019, 3:51 PM

## 2019-06-08 NOTE — Progress Notes (Addendum)
Pt ref CPAP for the night  

## 2019-06-08 NOTE — Progress Notes (Signed)
Progress Note  Patient Name: Daniel Valenzuela Date of Encounter: 06/08/2019  Primary Cardiologist: Lewayne Bunting, MD   Subjective   No chest pain and improved breathing, he was able to sleep on back for 5 hours, flat last night without CPAP    Inpatient Medications    Scheduled Meds: . atorvastatin  80 mg Oral QPM  . buPROPion  300 mg Oral Daily  . Chlorhexidine Gluconate Cloth  6 each Topical Daily  . enoxaparin (LOVENOX) injection  90 mg Subcutaneous Q24H  . escitalopram  20 mg Oral QPM  . furosemide  80 mg Intravenous TID  . gabapentin  1,200 mg Oral QHS  . gabapentin  600 mg Oral Q breakfast  . insulin aspart  0-15 Units Subcutaneous TID WC  . insulin aspart  0-5 Units Subcutaneous QHS  . insulin aspart  15 Units Subcutaneous TID WC  . insulin NPH Human  30 Units Subcutaneous BID AC & HS  . lisinopril  10 mg Oral Daily  . pantoprazole  40 mg Oral BID   Continuous Infusions:  PRN Meds: HYDROcodone-acetaminophen   Vital Signs    Vitals:   06/08/19 0342 06/08/19 0543 06/08/19 0926 06/08/19 0928  BP:  (!) 119/93 (!) 148/134 125/76  Pulse:  93 92 89  Resp:  18    Temp:  98.4 F (36.9 C)    TempSrc:  Oral    SpO2:  94%    Weight: (!) 176.1 kg     Height:        Intake/Output Summary (Last 24 hours) at 06/08/2019 1120 Last data filed at 06/08/2019 1009 Gross per 24 hour  Intake 840 ml  Output 2200 ml  Net -1360 ml   Last 3 Weights 06/08/2019 06/07/2019 06/06/2019  Weight (lbs) 388 lb 4.8 oz 387 lb 14.4 oz 381 lb 1.6 oz  Weight (kg) 176.132 kg 175.95 kg 172.866 kg      Telemetry    Presumed SR with 1st degree AV block it is regular, + PVCs  - Personally Reviewed  ECG    No new - Personally Reviewed  Physical Exam   GEN: No acute distress.   Neck: No JVD Cardiac: RRR, + murmur but muffled, no rubs, or gallops.  Respiratory: Clear to auscultation bilaterally though muffled. GI: Soft, nontender, non-distended  MS: tr to 1+ edema of lower ext; No  deformity. Neuro:  Nonfocal  Psych: Normal affect   Labs    High Sensitivity Troponin:   Recent Labs  Lab 06/04/19 0943 06/04/19 1331  TROPONINIHS 109* 110*      Chemistry Recent Labs  Lab 06/05/19 0410 06/06/19 0418 06/07/19 0450 06/08/19 0417  NA 141 141 142 141  K 4.3 3.5 3.9 3.4*  CL 99 100 99 100  CO2 29 33* 32 28  GLUCOSE 199* 139* 126* 123*  BUN 11 13 18 18   CREATININE 0.92 0.96 1.03 0.97  CALCIUM 8.9 8.7* 8.9 8.6*  PROT 6.6 6.2* 6.0*  --   ALBUMIN 3.0* 2.8* 3.1*  --   AST 24 29 26   --   ALT 19 18 16   --   ALKPHOS 96 92 78  --   BILITOT 1.1 1.2 1.0  --   GFRNONAA >60 >60 >60 >60  GFRAA >60 >60 >60 >60  ANIONGAP 13 8 11 13      Hematology Recent Labs  Lab 06/05/19 0410 06/06/19 0418 06/07/19 0450  WBC 10.8* 9.5 10.8*  RBC 4.66 4.47 4.25  HGB 13.5  13.0 12.3*  HCT 41.9 40.3 38.4*  MCV 89.9 90.2 90.4  MCH 29.0 29.1 28.9  MCHC 32.2 32.3 32.0  RDW 15.3 15.2 15.4  PLT 288 267 246    BNP Recent Labs  Lab 06/04/19 1331  BNP 502.7*     DDimer No results for input(s): DDIMER in the last 168 hours.   Radiology    No results found.  Cardiac Studies    Echo 06/05/19 IMPRESSIONS   1. Left ventricular ejection fraction, by visual estimation, is 30 to 35%. The left ventricle has moderate to severely decreased function. There is no left ventricular hypertrophy. 2. Definity contrast agent was given IV to delineate the left ventricular endocardial borders. 3. Left ventricular diastolic parameters are indeterminate. 4. Moderately dilated left ventricular internal cavity size. 5. Diffuse hypokinesis worse in the apex and septum. 6. Global right ventricle has normal systolic function.The right ventricular size is normal. No increase in right ventricular wall thickness. 7. Left atrial size was moderately dilated. 8. Right atrial size was not well visualized. 9. Moderate mitral annular calcification. 10. Severe calcification of the mitral  valve leaflet(s). 11. Severe thickening of the mitral valve leaflet(s). 12. The mitral valve is degenerative. Trace mitral valve regurgitation. 13. The tricuspid valve is normal in structure. Tricuspid valve regurgitation is mild. 14. The aortic valve is tricuspid. Aortic valve regurgitation is not visualized. Severe aortic valve stenosis. 15. Severely calcified with restricted leaflet motion Likely low flow severe AS DVI 0.20 mean gradient increased from 28 mmHg to 35 mmHg peak increased from 47 to 51 mmHg since January. 16. The pulmonic valve was grossly normal. Pulmonic valve regurgitation is mild. 17. The interatrial septum was not well visualized.  FINDINGS Left Ventricle: Left ventricular ejection fraction, by visual estimation, is 30 to 35%. The left ventricle has moderate to severely decreased function. Definity contrast agent was given IV to delineate the left ventricular endocardial borders. The left  ventricular internal cavity size was moderately dilated left ventricle. There is no left ventricular hypertrophy. Left ventricular diastolic parameters are indeterminate. Diffuse hypokinesis worse in the apex and septum.  Right Ventricle: The right ventricular size is normal. No increase in right ventricular wall thickness. Global RV systolic function is has normal systolic function.  Left Atrium: Left atrial size was moderately dilated.  Right Atrium: Right atrial size was not well visualized  Pericardium: There is no evidence of pericardial effusion.  Mitral Valve: The mitral valve is degenerative in appearance. There is severe thickening of the mitral valve leaflet(s). There is severe calcification of the mitral valve leaflet(s). Moderate mitral annular calcification. Trace mitral valve  regurgitation.  Tricuspid Valve: The tricuspid valve is normal in structure. Tricuspid valve regurgitation is mild.  Aortic Valve: The aortic valve is tricuspid. Aortic valve  regurgitation is not visualized. Severe aortic stenosis is present. Aortic valve mean gradient measures 35.0 mmHg. Aortic valve peak gradient measures 50.7 mmHg. Aortic valve area, by VTI measures 0.84 cm. Severely calcified with restricted leaflet motion Likely low flow severe AS DVI 0.20 mean gradient increased from 28 mmHg to 35 mmHg peak increased from 47 to 51 mmHg since January.  Pulmonic Valve: The pulmonic valve was grossly normal. Pulmonic valve regurgitation is mild.  Aorta: The aortic root is normal in size and structure.  IAS/Shunts: The interatrial septum was not well visualized.   Patient Profile     68 y.o. male with a hx of chronic sysotlic HF and aortic stenosis, non obstructive disease by cath  07/2018, hx of syncope, OSA, DM now admitted with acute systolic HF  Assessment & Plan    1. Acute on chronic systolic HF - massive weight gain, about 40 lbs since July - echo this admit LVEF 30-35%, severe AS. In Jan 2020 LVEF 35-40%, mod AS Jan 2020 cath mild to mod CAD  - neg 840 ml yesterday though I/Os incomplete at times and neg 3.8 L since admission, he is on lasix 60mg  IV bid.Limited objective date on I/Os, increased lasix to 80mg  tid yessterday and now neg again  --wt down from 180.2 Kg on admit to 176 Kg. Down 9.2 Lbs total.  ? Add metolazone  On fluid restriction 1500 cc per day and low Na diet.    - continue IV diuresis, going to take quite some time. Net negative 2-3 L per day would be our goal - medical therapy with lisinopril 10mg . Im not sure what happened to his toprol, I dont see any documentation that it was to be discontinued. Does not have on his list. Hold at this time since decompensated, on tele pretty long 1st degree av block as well.  - soft bp's at tmes, monitor. May transition to entresto this admission, would require ACEI washout.   2. Aortic stenosis AV mean grade 35, AVA VTI 0.73, dimensionless index 0.2. Just misses mean gradient  critiera in setting of low LVEF, fair to say his AS is indeed severe.  And increased since Jan 2020 - will need to be evaluated by structural heart team once he is closer to [facility] ask today to follow along  3.  Hx of syncope with Loop recorder.    4. LBBB chronic  5.  Probable SR with long 1st degree AV block difficult to see on tele, on EKG long first degree  6  OSA with CPAP will order for use here  7.  GERD requiring pt to sit up and liquid returns will increase protonix to BID per primary team  8.  Toe bleed with prior toenail removal and in diabetic, will have wound care to see. Per primary team  9.  Urinary retention with foley, check urinalysis,  Per primary team       For questions or updates, please contact [facility] Please consult [email or web address] for contact info under        Signed, Nada Boozer, NP  06/08/2019, 11:20 AM

## 2019-06-08 NOTE — Care Management Important Message (Signed)
Important Message  Patient Details  Name: Daniel Valenzuela MRN: [id number] Date of Birth: [birth year]   Medicare Important Message Given:  Yes     Shelda Altes 06/08/2019, 1:18 PM

## 2019-06-08 NOTE — TOC Progression Note (Addendum)
Transition of Care [facility]) - Progression Note    Patient Details  Name: Daniel Valenzuela MRN: [id number] Date of Birth: 12/30/1950  Transition of Care [facility]) CM/SW Contact  Zenon Mayo, RN Phone Number: 06/08/2019, 10:18 AM  Clinical Narrative:    From home with wife, NCM offered [facility] for CHF , patient states he does not need a HHRN, he has cpap and walker at home.  TOC team will cont to follow for TOC needs. Per pt eval rec Cardiopulmonary rehab.         Expected Discharge Plan and Services                                                 Social Determinants of Health (SDOH) Interventions    Readmission Risk Interventions No flowsheet data found.

## 2019-06-08 NOTE — Progress Notes (Signed)
Patient ID: Daniel Valenzuela, male   DOB: 12-05-1950, 68 y.o.   MRN: [id number]  PROGRESS NOTE    Daniel Valenzuela  Daniel Valenzuela DOB: September 05, 1950 DOA: 06/04/2019 PCP: Carylon Perches, MD   Brief Narrative:  68 year old male with history of insulin-dependent diabetes mellitus type 2, heart failure with reduced ejection fraction (last echocardiogram on January 2020 showed ejection fraction of 35 to 40%, hypertension, hyperlipidemia admitted with acute shortness of breath worsening bilateral and abdominal edema.  Patient endorsed about 40 pounds weight gain.  He admitted to frequent consumption of coffee and beverages, mostly unsweetened tea which has been ongoing for the past several months.  He takes Lasix p.o. 40 mg daily which appears not to be effective as a highly makes much urine after taking Lasix. In the ED, he has elevated BNP, troponin of 110 with a stable creatinine of 0.79. Chest x-ray showed cardiomegaly with bilateral pulmonary interstitial prominence consistent with fluid overload. Echocardiogram done on 06/05/2019 showed estimated ejection fraction of 30 to 35%, when compared with echo of January 2020 showed some reduction in ejection fraction.  There is moderate to severe decreased left ventricular function.  There is severe aortic stenosis with aortic valve area less than 1 cm. Will continue with guideline directed medical therapy and consult cardiology for evaluation.  Patient getting increasing doses of IV Lasix  Assessment & Plan:   Principal Problem:   Acute combined systolic and diastolic CHF Active Problems:   Diabetic neuropathy (HCC)   High cholesterol   Shortness of breath   Type 2 diabetes mellitus with complication, with long-term current use of insulin (HCC)   Morbid obesity with BMI of 45.0-49.9, adult (HCC)   CAD (coronary artery disease)   CHF exacerbation (HCC)  Clinical problems list 1.  Acute exacerbation of heart failure with reduced ejection fraction 2.   Diabetes mellitus type 2, with long-term insulin use/diabetic neuropathy 3.  Hyperlipidemia 4.  Essential hypertension 5.  Elevated troponin 6.  Morbid obesity BMI 54.9  1.  Acute exacerbation of heart failure with reduced ejection fraction: Patient presenting with worsening shortness of breath, bilateral pitting pedal edema, abdominal fullness and 40 pound weight gain.  Admitted to increased fluid consumption especially coffee with unsweetened tea. Takes Lasix 40 mg daily at home which appears not to have been effective.  Echocardiogram done in January 2020 showed ejection fraction of 35 to 40% by estimation.  Repeat echocardiogram today was significant for ejection fraction of 30 to 35% with moderate to severe decrease left ventricular function and severe aortic stenosis with aortic valve area less than 1 cm. -Will continue with IV Lasix 80 mg 3 times daily -Hold off on beta-blocker due to conduction disease and decompensated state of congestive heart failure.  ACE inhibitor has been restarted but will monitor closely given the patient's severe aortic stenosis -2 g sodium diet -Fluid restriction to 1500 cc per 24 hours -Daily weight -Cardiology on board.  2.  Diabetes mellitus type 2, with long-term insulin use/diabetic neuropathy.  Hemoglobin A1c 8.4 -Continue with sliding scale insulin -Continue long-acting insulin -Mealtime insulin 3 times daily -Fingersticks before meals and at bedtime -Hypoglycemic protocol -Continue with gabapentin  3.  Hyperlipidemia Continue with atorvastatin 80 mg daily  4.  Essential hypertension.  Blood pressure currently running low without compensatory/reflex tachycardia concerning for possible cardiac ischemia.  5.  Elevated troponin.  High-sensitivity troponin elevated at 110. EKG showed AV conduction delay with T wave inversion in leads V1.  A possible  septal infarct.  When compared with EKG of August 10, 2018, appears not to be significant.  6.   Morbid obesity BMI 54.91 Nutritional referral  7.  Severe aortic stenosis -Cardiology following and recommends structural heart disease evaluation once patient is closer to being euvolemic   DVT prophylaxis: Lovenox subcute  Code Status: Full  Family Communication: Discussed with patient  Disposition Plan: Pending clinical improvement    Consultants:   Cardiology  Procedures: 2D echocardiogram 06/05/2019: Ejection fraction 30 to 35% with severely reduced left ventricular function.   Antimicrobials: None    Subjective: Patient endorses significant urine output and Foley catheter yesterday with increased dose of IV Lasix.  He also states for the first time he was able to sleep 5 hours continuously on his back for as long as he can remember.  Objective: Vitals:   06/08/19 0543 06/08/19 0926 06/08/19 0928 06/08/19 1341  BP: (!) 119/93 (!) 148/134 125/76 91/79  Pulse: 93 92 89 100  Resp: 18   20  Temp: 98.4 F (36.9 C)   97.8 F (36.6 C)  TempSrc: Oral   Oral  SpO2: 94%   93%  Weight:      Height:        Intake/Output Summary (Last 24 hours) at 06/08/2019 1406 Last data filed at 06/08/2019 1009 Gross per 24 hour  Intake 600 ml  Output 1450 ml  Net -850 ml   Filed Weights   06/06/19 0501 06/07/19 0524 06/08/19 0342  Weight: (!) 172.9 kg (!) 176 kg (!) 176.1 kg    Examination:  General exam: Appears calm and comfortable  Respiratory system: Distant lung sounds.  Respiratory effort normal. Cardiovascular system: S1 & S2 heard, RRR. +JVD, no murmurs, rubs, gallops or clicks.  Distant heart sounds due to morbid obesity. Gastrointestinal system: Abdomen is obese soft and nontender.  Unable to palpate any organ due to abdominal distention.   Central nervous system: Alert and oriented. No focal neurological deficits. Extremities: Symmetric 5 x 5 power.  Bilateral 1+ pitting pedal edema up to mid shin Skin: Multiple ecchymotic areas of lower extremity  Psychiatry:  Judgement and insight appear normal. Mood & affect appropriate.    LOS: 4 days    Time spent: [place], MD [facility]  If 7PM-7AM, please contact night-coverage [email or web address] Password [email or web address] 06/08/2019, 2:06 PM

## 2019-06-08 NOTE — Consult Note (Addendum)
[facility]VE TEAM  Inpatient TAVR Consultation:   Patient ID: Daniel Valenzuela; [id number]; 1950/08/12   Admit date: 06/04/2019 Date of Consult: 06/08/2019  Primary Care Provider: Asencion Noble, MD Primary Cardiologist/EP: Dr. Lovena Le   Patient Profile:   URIE Valenzuela is a 68 y.o. male with a hx of HTN, morbid obesity s/p gastric banding in 2009 (BMI 25), IDDMT2, OSA not on CPAP, conduction disease (RBBB, 1st deg AV block), syncope s/p loop recorder, GERD, chronic combined S/D CHF and aortic stenosis who is being seen today for the evaluation of severe AS at the request of Dr. Harrell Gave.  History of Present Illness:   [name] lives in [place] with his wife.  He has 1 biological daughter and 1 stepson as well as 4 dogs.  He previously worked in Engineer, civil (consulting) work and Lobbyist.  His job was very physically demanding and about 9 years ago he was forced to go on disability due to chronic back issues, peripheral neuropathy and carpal tunnel syndrome.  He had remained very active cutting the grass and weed eating for himself and several other neighbors until recently. Over the past year or so he has had progressive worsening of fatigue and shortness of breath to the point where he cannot even do his own yard work. He gets dyspnea on exertion with minimal exertion. Of note, the patient has all of his natural teeth.  He has several missing teeth and has not seen a dentist in over 7 years.  His cardiac history dates back to 08/2017 when he was admitted after a syncopal episode. Etiology felt to be vasovagal, but given underlying RBBB and long 1st deg AV block (PR 350 ms), he underwent loop recorder implantation. Verapamil was discontinued. Echo at that time showed EF 60-65%, mild LVH, mild AS with mean gradient of 15 mm Hg. He has been followed by Dr. Lovena Le since that time with no recurrence of syncope or evidence of conduction system disease on Loop  recorder.   He was admitted 07/2018 with acute CHF with massive volume overload (admit weight 400lbs--> DC weight 350 lbs). Echo at that time showed new LV dysfunction with EF 35-40%, mod-severe AS (mean gradient 28 mmHg; AVA by continuity equation 1.34 cm2; however dimensionless index of 0.23 suggestive of severe AS) but study was technically difficult. L/RHC showed mild-mod non obst CAD, mild-moderately elevated left heart filling pressures, prominent RA tracings (? Significant TR), low normal Fick cardiac output/index and moderate AS ( mean gradient: 23 mmHg, Peak-to-peak gradient: 30 mmHg, Valve area: 1.4 cm^2.) His AS was felt to be moderate at that time and continued monitoring was recommended. He was discharged on Toprol XL. No ACEi/ARB was added given low BPs.   He was seen in the office by Daniel Ransom PA-C on 06/04/19 for evaluation of worsening dyspnea and LE edema. Weight up to 406lbs despite self increasing home lasix. He was sent to the [facility] ED for admission. In the ER, K 4.3, Cr 0.79, BUN 8, WBC 9, Hgb 12.5, Plt 262, BNP 502, Hgb A1c 8.4, HS trop 109-->110, EKG SR with chronic RBBB, CXR showed cardiomegaly with pulm edema. Repeat echo showed EF 30-35%, severe MAC, mild TR, severe AS with mean gradient 35 mm hg, peak gradient 96m Hg, DVI 0.20, AVA 0.83 cm2. He has been on IV lasix 846mTID and diuresed net neg 4.3L. The structural heart team is consultation for consideration of TAVR.  He  is seen sitting up in bed. He is feeling better with diuresis. LE edema improving. No chest pain, orthopnea, PND. Still having significant dyspnea even when walking to the bathroom.    Past Medical History:  Diagnosis Date  . Aortic stenosis, moderate 07/2018   felt to be moderate at cath Jan 2020  . CAD (coronary artery disease) 07/2018   mild non obstructive  . CHF (congestive heart failure) ([place])   . Chronic back pain    "mainly lower right now" (08/12/2018)  . Depression   . Diabetic neuropathy ([place])    . GERD (gastroesophageal reflux disease)   . High cholesterol   . Hypertension   . Morbid obesity with BMI of 45.0-49.9, adult ([place])   . NICM (nonischemic cardiomyopathy) ([place]) 07/2018   EF 35-40%  . Rheumatoid arteritis ([place])    "hands" (08/12/2018)  . Sleep apnea    "lost weight; not on CPAP anymore" (08/12/2018)  . Spinal stenosis of lumbar region    "w/slipped disc" (08/12/2018)  . Syncope    Felt to be secondary to autonomic dysfunction-s/p Loop recorder Feb 2019  . Type II diabetes mellitus ([place])     Past Surgical History:  Procedure Laterality Date  . HERNIA REPAIR    . LAPAROSCOPIC GASTRIC BANDING    . LOOP RECORDER INSERTION N/A 09/01/2017   Procedure: LOOP RECORDER INSERTION;  Surgeon: Deboraha Sprang, MD;  Location: [facility] CV LAB;  Service: Cardiovascular;  Laterality: N/A;  . RIGHT/LEFT HEART CATH AND CORONARY ANGIOGRAPHY N/A 08/14/2018   Procedure: RIGHT/LEFT HEART CATH AND CORONARY ANGIOGRAPHY;  Surgeon: Nelva Bush, MD;  Location: [facility] LAB;  Service: Cardiovascular;  Laterality: N/A;  . SHOULDER ARTHROSCOPY Right   . TONSILLECTOMY    . UMBILICAL HERNIA REPAIR       Inpatient Medications: Scheduled Meds: . atorvastatin  80 mg Oral QPM  . buPROPion  300 mg Oral Daily  . Chlorhexidine Gluconate Cloth  6 each Topical Daily  . enoxaparin (LOVENOX) injection  90 mg Subcutaneous Q24H  . escitalopram  20 mg Oral QPM  . furosemide  80 mg Intravenous TID  . gabapentin  1,200 mg Oral QHS  . gabapentin  600 mg Oral Q breakfast  . insulin aspart  0-15 Units Subcutaneous TID WC  . insulin aspart  0-5 Units Subcutaneous QHS  . insulin aspart  15 Units Subcutaneous TID WC  . insulin NPH Human  30 Units Subcutaneous BID AC & HS  . lisinopril  10 mg Oral Daily  . pantoprazole  40 mg Oral BID   Continuous Infusions:  PRN Meds: HYDROcodone-acetaminophen  Allergies:    Allergies  Allergen Reactions  . Methyldopa-Hydrochlorothiazide Other (See  Comments)    GYNOCOMASTIA  . Motrin [Ibuprofen] Other (See Comments)    GYNOCOMASTIA  . Nsaids Other (See Comments)    Patient has had lap band surgery- cannot tolerate this class of meds  . Amoxicillin Rash    Social History:   Social History   Socioeconomic History  . Marital status: Married    Spouse name: Not on file  . Number of children: Not on file  . Years of education: Not on file  . Highest education level: Not on file  Occupational History  . Not on file  Social Needs  . Financial resource strain: Not on file  . Food insecurity    Worry: Not on file    Inability: Not on file  . Transportation needs    Medical:  Not on file    Non-medical: Not on file  Tobacco Use  . Smoking status: Never Smoker  . Smokeless tobacco: Never Used  Substance and Sexual Activity  . Alcohol use: No    Frequency: Never  . Drug use: Never  . Sexual activity: Not Currently  Lifestyle  . Physical activity    Days per week: Not on file    Minutes per session: Not on file  . Stress: Not on file  Relationships  . Social Herbalist on phone: Not on file    Gets together: Not on file    Attends religious service: Not on file    Active member of club or organization: Not on file    Attends meetings of clubs or organizations: Not on file    Relationship status: Not on file  . Intimate partner violence    Fear of current or ex partner: Not on file    Emotionally abused: Not on file    Physically abused: Not on file    Forced sexual activity: Not on file  Other Topics Concern  . Not on file  Social History Narrative  . Not on file    Family History:   The patient's family history includes Cancer in his mother; Diabetes in his mother; Heart failure in his father; Parkinson's disease in his father.  ROS:  Please see the history of present illness.  ROS  All other ROS reviewed and negative.     Physical Exam/Data:   Vitals:   06/08/19 0543 06/08/19 0926 06/08/19  0928 06/08/19 1341  BP: (!) 119/93 (!) 148/134 125/76 91/79  Pulse: 93 92 89 100  Resp: 18   20  Temp: 98.4 F (36.9 C)   97.8 F (36.6 C)  TempSrc: Oral   Oral  SpO2: 94%   93%  Weight:      Height:        Intake/Output Summary (Last 24 hours) at 06/08/2019 1518 Last data filed at 06/08/2019 1009 Gross per 24 hour  Intake 600 ml  Output 1450 ml  Net -850 ml   Filed Weights   06/06/19 0501 06/07/19 0524 06/08/19 0342  Weight: (!) 172.9 kg (!) 176 kg (!) 176.1 kg   Body mass index is 54.16 kg/m.  General:  Well nourished, well developed, in no acute distress, morbidly obese HEENT: normal Lymph: no adenopathy Neck: no JVD Endocrine:  No thryomegaly Cardiac:  normal S1, S2; RRR; 2/6 harsh SEM, distant heart sounds Lungs:  clear to auscultation bilaterally, no wheezing, rhonchi or rales  Abd: soft, nontender, no hepatomegaly  Ext: 1-2 + bilateral LE edema. Musculoskeletal:  No deformities, BUE and BLE strength normal and equal Skin: warm and dry  Neuro:  CNs 2-12 intact, no focal abnormalities noted Psych:  Normal affect   EKG:  The EKG was personally reviewed and demonstrates: RBBB, sinus vs junctional tach Telemetry:  Telemetry was personally reviewed and demonstrates: sinus vs junctional tachy with PVCs, 1 st deg AV block  Relevant CV Studies: 08/14/18 Procedures RIGHT/LEFT HEART CATH AND CORONARY ANGIOGRAPHY   Conclusions: 1. Mild to moderate, non-obstructive CAD. 2. Mildly to moderately elevated left heart filling pressures. 3. Moderately elevated right heart and pulmonary artery pressures. Prominent RA tracing V-waves raise possibility of significant tricuspid regurgitation. 4. Low normal Fick cardiac output/index. 5. Moderate aortic stenosis. Recommendations: 1. Continue diuresis. 2. Further workup and management of aortic stenosis per structural heart team. 3. Medical therapy to prevent progression  of coronary artery disease.  Nelva Bush, MD [facility]  HeartCare Pager: [phone]    _________________   Echo 06/05/19 1. Left ventricular ejection fraction, by visual estimation, is 30 to 35%. The left ventricle has moderate to severely decreased function. There is no left ventricular hypertrophy. 2. Definity contrast agent was given IV to delineate the left ventricular endocardial borders. 3. Left ventricular diastolic parameters are indeterminate. 4. Moderately dilated left ventricular internal cavity size. 5. Diffuse hypokinesis worse in the apex and septum. 6. Global right ventricle has normal systolic function.The right ventricular size is normal. No increase in right ventricular wall thickness. 7. Left atrial size was moderately dilated. 8. Right atrial size was not well visualized. 9. Moderate mitral annular calcification. 10. Severe calcification of the mitral valve leaflet(s). 11. Severe thickening of the mitral valve leaflet(s). 12. The mitral valve is degenerative. Trace mitral valve regurgitation. 13. The tricuspid valve is normal in structure. Tricuspid valve regurgitation is mild. 14. The aortic valve is tricuspid. Aortic valve regurgitation is not visualized. Severe aortic valve stenosis. 15. Severely calcified with restricted leaflet motion Likely low flow severe AS DVI 0.20 mean gradient increased from 28 mmHg to 35 mmHg peak increased from 47 to 51 mmHg since January. 16. The pulmonic valve was grossly normal. Pulmonic valve regurgitation is mild. 17. The interatrial septum was not well visualized. Left Ventricle: Left ventricular ejection fraction, by visual estimation, is 30 to   Laboratory Data:  Chemistry Recent Labs  Lab 06/06/19 0418 06/07/19 0450 06/08/19 0417  NA 141 142 141  K 3.5 3.9 3.4*  CL 100 99 100  CO2 33* 32 28  GLUCOSE 139* 126* 123*  BUN _0 CREATININE 0.96 1.03 0.97  CALCIUM 8.7* 8.9 8.6*  GFRNONAA >60 >60 >60  GFRAA >60 >60 >60  ANIONGAP _1 Recent Labs  Lab  06/05/19 0410 06/06/19 0418 06/07/19 0450  PROT 6.6 6.2* 6.0*  ALBUMIN 3.0* 2.8* 3.1*  AST _2 ALT _3 ALKPHOS 96 92 78  BILITOT 1.1 1.2 1.0   Hematology Recent Labs  Lab 06/05/19 0410 06/06/19 0418 06/07/19 0450  WBC 10.8* 9.5 10.8*  RBC 4.66 4.47 4.25  HGB 13.5 13.0 12.3*  HCT 41.9 40.3 38.4*  MCV 89.9 90.2 90.4  MCH 29.0 29.1 28.9  MCHC 32.2 32.3 32.0  RDW 15.3 15.2 15.4  PLT 288 267 246   Cardiac EnzymesNo results for input(s): TROPONINI in the last 168 hours. No results for input(s): TROPIPOC in the last 168 hours.  BNP Recent Labs  Lab 06/04/19 1331  BNP 502.7*    DDimer No results for input(s): DDIMER in the last 168 hours.  Radiology/Studies:  No results found.   STS Risk Calculator:  Procedure: Isolated AVR  Risk of Mortality :2.947% Renal Failure: 3.334% Permanent Stroke: 0.810% Prolonged Ventilation: 15.215% DSW Infection: 0.448% Reoperation: 3.174% Morbidity or Mortality: 21.110% Short Length of Stay: 27.966% Long Length of Stay: 10.493%  ________________  [place]  [id number] 06/08/2019  1 a. Ability to shower/bathe Other, Did not do  1 b. Ability to walk 1 block Extremely limited  1 c. Ability to hurry/jog Other, Did not do  2. Edema feet/ankles/legs Every morning  3. Limited by fatigue All of the time  4. Limited by dyspnea All of the time  5. Sitting up / on 3+ pillows Never over the past 2 weeks  6. Limited enjoyment of life Extremely  limited  7. Rest of life w/ symptoms Not at all satisfied  8 a. Participation in hobbies Severely limited  8 b. Participation in chores Severely limited  8 c. Visiting family/friends N/A, did not do for other reasons      Assessment and Plan:   TAVARIS EUDY is a 68 y.o. male with symptoms of severe, stage D aortic stenosis with NYHA Class IV symptoms. I have reviewed the patient's recent echocardiogram which is notable for reduced LV systolic function  (EF 51-70%) and severe aortic stenosis with peak gradient of 50.7 and mean transvalvular gradient of 35. The patient's dimensionless index is 0.2 and calculated aortic valve area is 0.83 cm. L/RHC from 07/2018 showed mild-mod non obst CAD, mild-moderately elevated left heart filling pressures, prominent RA tracings (? Significant TR), low normal Fick cardiac output/index and moderate AS ( mean gradient: 23 mmHg, Peak-to-peak gradient: 30 mmHg, Valve area: 1.4 cm^2.)    I have reviewed the natural history of aortic stenosis with the patient. We have discussed the limitations of medical therapy and the poor prognosis associated with symptomatic aortic stenosis. We have reviewed potential treatment options, including palliative medical therapy, conventional surgical aortic valve replacement, and transcatheter aortic valve replacement. We discussed treatment options in the context of this patient's specific comorbid medical conditions.   The patient has a history of mod-severe AS. He has had progressive fatigue and DOE over the past year and now admitted for acute combined S/D CHF. Repeat echo shows progression to severe AS.    The patient's predicted risk of mortality with conventional aortic valve replacement is 2.974% primarily based on his LV dysfunction, acute CHF, morbid obesity, HTN, IDDM, and OSA. TAVR seems like a reasonable treatment option for this patient pending formal cardiac surgical consultation. We discussed typical evaluation which will require a gated cardiac CTA and a CTA of the chest/abdomen/pelvis to evaluate both his cardiac anatomy and peripheral vasculature. Follow-up testing will be arranged as an outpatient. We will try to get the scans done tomorrow. Will follow orthopantogram and see if dental consultation is warranted. The patient is also counseled that with his underlying conduction disease (RBBB and 1st deg AV block) he would be at very high risk of HAVB after TAVR requiring PPM  implantation.   Dr. Angelena Form to follow.     Signed, Angelena Form, PA-C  06/08/2019 3:18 PM   I have personally seen and examined this patient with Angelena Form, PA-C. I agree with the assessment and plan as outlined above. Mr. Storlie is a pleasant 68 yo male with history of morbid obesity, HTN, chronic systolic CHF, non-ischemic cardiomyopathy, DM, sleep apnea and severe aortic stenosis. He has been followed over the past year for moderate aortic stenosis. He is now admitted with acute volume overload in the setting of chronic CHF. Echo during this admission is reviewed and while poor quality, shows moderate LV systolic dysfunction with severe aortic stenosis. (mean gradient 35 mmHg, peak 51 mmHg, DI 0.2, AVA 0.8 cm2). He is being diuresed and reports improvement in dyspnea.  Labs reviewed by me.  EKG reviewed by me and shows junctional rhythm vs sinus with long PR interval. RBBB  My exam:  General: Morbidly obese male. NAD  HEENT: OP clear, mucus membranes moist  SKIN: warm, dry. No rashes. Neuro: No focal deficits  Musculoskeletal: Muscle strength 5/5 all ext  Psychiatric: Mood and affect normal  Neck: No JVD, no carotid bruits, no thyromegaly, no lymphadenopathy.  Lungs:Clear bilaterally, no  wheezes, rhonci, crackles Cardiovascular: Regular rate and rhythm. Systolic murmur.  Abdomen:Soft. Bowel sounds present. Non-tender.  Extremities: 1-2+ bilateral lower extremity edema.   Severe aortic valve stenosis: He has severe, stage D aortic valve stenosis. I have personally reviewed the echo images. The aortic valve is thickened, calcified with limited leaflet mobility. I think he would benefit from AVR. Given his morbid obesity, he is not a good candidate for conventional AVR by surgical approach. I think he may be a good candidate for TAVR.  I have reviewed the natural history of aortic stenosis with the patient and their family members  who are present today. We have discussed the  limitations of medical therapy and the poor prognosis associated with symptomatic aortic stenosis. We have reviewed potential treatment options, including palliative medical therapy, conventional surgical aortic valve replacement, and transcatheter aortic valve replacement. We discussed treatment options in the context of the patient's specific comorbid medical conditions.   He would like to proceed with planning for TAVR. He will not need a repeat cardiac catheterization as this was performed January 2020 and showed mild non-obstructive disease. Risks and benefits of the TAVR procedure reviewed with the patient. We will go ahead and arrange his cardiac CT, CTA of the chest/abdomen and pelvis and carotid artery dopplers while he is hospitalized. We will then have him seen by one of the CT surgeons on our TAVR team. This could be done as an outpatient. We will follow with you this week.   Lauree Chandler 06/08/2019 4:37 PM

## 2019-06-09 ENCOUNTER — Inpatient Hospital Stay (HOSPITAL_COMMUNITY): Payer: Medicare Other

## 2019-06-09 ENCOUNTER — Encounter (HOSPITAL_COMMUNITY): Payer: Self-pay | Admitting: Dentistry

## 2019-06-09 DIAGNOSIS — I350 Nonrheumatic aortic (valve) stenosis: Secondary | ICD-10-CM

## 2019-06-09 LAB — BASIC METABOLIC PANEL
Anion gap: 12 (ref 5–15)
BUN: 19 mg/dL (ref 8–23)
CO2: 29 mmol/L (ref 22–32)
Calcium: 8.3 mg/dL — ABNORMAL LOW (ref 8.9–10.3)
Chloride: 100 mmol/L (ref 98–111)
Creatinine, Ser: 1 mg/dL (ref 0.61–1.24)
GFR calc Af Amer: >60 mL/min (ref >60)
GFR calc non Af Amer: >60 mL/min (ref >60)
Glucose, Bld: 175 mg/dL — ABNORMAL HIGH (ref 70–99)
Potassium: 3.5 mmol/L (ref 3.5–5.1)
Sodium: 141 mmol/L (ref 135–145)

## 2019-06-09 LAB — GLUCOSE, CAPILLARY
Glucose-Capillary: 149 mg/dL — ABNORMAL HIGH (ref 70–99)
Glucose-Capillary: 239 mg/dL — ABNORMAL HIGH (ref 70–99)
Glucose-Capillary: 100 mg/dL — ABNORMAL HIGH (ref 70–99)
Glucose-Capillary: 46 mg/dL — ABNORMAL LOW (ref 70–99)
Glucose-Capillary: 66 mg/dL — ABNORMAL LOW (ref 70–99)
Glucose-Capillary: 195 mg/dL — ABNORMAL HIGH (ref 70–99)

## 2019-06-09 MED ORDER — METOPROLOL TARTRATE 50 MG PO TABS
50.0000 mg | ORAL_TABLET | Freq: Once | ORAL | Status: AC
  Administered 2019-06-09: 50 mg via ORAL
  Filled 2019-06-09: qty 1

## 2019-06-09 MED ORDER — METOPROLOL TARTRATE 5 MG/5ML IV SOLN
10.0000 mg | Freq: Once | INTRAVENOUS | Status: AC
  Administered 2019-06-09: 11:00:00 10 mg via INTRAVENOUS

## 2019-06-09 MED ORDER — POTASSIUM CHLORIDE CRYS ER 20 MEQ PO TBCR
40.0000 meq | EXTENDED_RELEASE_TABLET | Freq: Once | ORAL | Status: AC
  Administered 2019-06-09: 40 meq via ORAL
  Filled 2019-06-09: qty 2

## 2019-06-09 MED ORDER — IOHEXOL 350 MG/ML SOLN
150.0000 mL | Freq: Once | INTRAVENOUS | Status: AC | PRN
  Administered 2019-06-09: 150 mL via INTRAVENOUS

## 2019-06-09 MED ORDER — METOPROLOL TARTRATE 5 MG/5ML IV SOLN
INTRAVENOUS | Status: AC
  Administered 2019-06-09: 11:00:00 10 mg via INTRAVENOUS
  Filled 2019-06-09: qty 10

## 2019-06-09 NOTE — Progress Notes (Signed)
Patient blood sugar was 49 at 3:50. After two cycles of orange juices and graham crackers every 15 min, we rechecked and patient's current blood sugar is 100.

## 2019-06-09 NOTE — Consult Note (Signed)
DENTAL CONSULTATION  Date of Consultation:  06/09/2019 Patient Name:   Daniel Valenzuela Date of Birth:   02-08-1951 Medical Record Number: [id number]  COVID 19 SCREENING: The patient does not symptoms concerning for COVID-19 infection (Including fever, chills, cough, or new SHORTNESS OF BREATH).    VITALS: BP 110/84 (BP Location: Left Arm)   Pulse 71   Temp 98 F (36.7 C) (Oral)   Resp 18   Ht 5\' 11"  (1.803 m)   Wt (!) 175.9 kg   SpO2 96%   BMI 54.07 kg/m   CHIEF COMPLAINT: The patient was referred by Dr. Angelena Form for dental consultation.  HPI: HALLIS MEDITZ is a 68 year old male recently diagnosed with severe aortic stenosis.  Patient with anticipated aortic valve replacement.  Patient is now seen as part of a preheart valve surgery dental protocol examination to rule out dental infection that may affect the patient stomach health and anticipated heart valve surgery.  The patient currently denies acute toothaches, swellings, or abscesses.  Patient was last seen by a dentist approximately 7 years ago and [place], [place].  This was Dr. Alla German.  The patient had a tooth extracted at that time with no complications.  Patient subsequently lost his dental insurance and has not been going to the dentist due to economic concerns.  Patient denies having partial dentures.  Patient denies having dental phobia.  PROBLEM LIST: Patient Active Problem List   Diagnosis Date Noted  . Hyperkalemia 06/04/2019  . CHF exacerbation ([place]) 06/04/2019  . Morbid obesity with BMI of 45.0-49.9, adult ([place]) 08/31/2018  . Acute combined systolic and diastolic CHF   . Aortic valve stenosis   . Shortness of breath 08/10/2018  . Sleep apnea 08/10/2018  . Type 2 diabetes mellitus with complication, with long-term current use of insulin ([place]) 08/10/2018  . CHF (congestive heart failure) ([place]) 08/10/2018  . CAD (coronary artery disease) 07/29/2018  . Aortic stenosis, moderate 07/29/2018  . Syncope  08/30/2017  . RBBB (right bundle branch block with left anterior fascicular block) 08/30/2017  . Diabetic neuropathy ([place])   . Obesity   . Chronic back pain   . Spinal stenosis   . High cholesterol     PMH: Past Medical History:  Diagnosis Date  . Aortic stenosis, moderate 07/2018   felt to be moderate at cath Jan 2020  . CAD (coronary artery disease) 07/2018   mild non obstructive  . CHF (congestive heart failure) ([place])   . Chronic back pain    "mainly lower right now" (08/12/2018)  . Depression   . Diabetic neuropathy ([place])   . GERD (gastroesophageal reflux disease)   . High cholesterol   . Hypertension   . Morbid obesity with BMI of 45.0-49.9, adult ([facility] [place])   . NICM (nonischemic cardiomyopathy) ([place]) 07/2018   EF 35-40%  . Rheumatoid arteritis ([place])    "hands" (08/12/2018)  . Sleep apnea    "lost weight; not on CPAP anymore" (08/12/2018)  . Spinal stenosis of lumbar region    "w/slipped disc" (08/12/2018)  . Syncope    Felt to be secondary to autonomic dysfunction-s/p Loop recorder Feb 2019  . Type II diabetes mellitus ([facility])     PSH: Past Surgical History:  Procedure Laterality Date  . HERNIA REPAIR    . LAPAROSCOPIC GASTRIC BANDING    . LOOP RECORDER INSERTION N/A 09/01/2017   Procedure: LOOP RECORDER INSERTION;  Surgeon: Deboraha Sprang, MD;  Location: [facility];  Service: Cardiovascular;  Laterality: N/A;  . RIGHT/LEFT HEART CATH AND CORONARY ANGIOGRAPHY N/A 08/14/2018   Procedure: RIGHT/LEFT HEART CATH AND CORONARY ANGIOGRAPHY;  Surgeon: Yvonne Kendall, MD;  Location: [facility];  Service: Cardiovascular;  Laterality: N/A;  . SHOULDER ARTHROSCOPY Right   . TONSILLECTOMY    . UMBILICAL HERNIA REPAIR      ALLERGIES: Allergies  Allergen Reactions  . Methyldopa-Hydrochlorothiazide Other (See Comments)    GYNOCOMASTIA  . Motrin [Ibuprofen] Other (See Comments)    GYNOCOMASTIA  . Nsaids Other (See Comments)    Patient has had lap band surgery-  cannot tolerate this class of meds  . Amoxicillin Rash    MEDICATIONS: Current Facility-Administered Medications  Medication Dose Route Frequency Provider Last Rate Last Dose  . atorvastatin (LIPITOR) tablet 80 mg  80 mg Oral QPM Aquilla Hacker, MD   80 mg at 06/08/19 1644  . buPROPion (WELLBUTRIN XL) 24 hr tablet 300 mg  300 mg Oral Daily Jerald Kief, MD   300 mg at 06/09/19 0851  . Chlorhexidine Gluconate Cloth 2 % PADS 6 each  6 each Topical Daily Aquilla Hacker, MD   6 each at 06/09/19 1119  . enoxaparin (LOVENOX) injection 90 mg  90 mg Subcutaneous Q24H Jerald Kief, MD   90 mg at 06/08/19 1641  . escitalopram (LEXAPRO) tablet 20 mg  20 mg Oral QPM Jerald Kief, MD   20 mg at 06/08/19 1644  . furosemide (LASIX) injection 80 mg  80 mg Intravenous TID Jodelle Red, MD   80 mg at 06/09/19 0906  . gabapentin (NEURONTIN) tablet 1,200 mg  1,200 mg Oral QHS Jerald Kief, MD   1,200 mg at 06/08/19 2142  . gabapentin (NEURONTIN) tablet 600 mg  600 mg Oral Q breakfast Jerald Kief, MD   600 mg at 06/09/19 0850  . HYDROcodone-acetaminophen (NORCO/VICODIN) 5-325 MG per tablet 1 tablet  1 tablet Oral Q4H PRN Jerald Kief, MD      . insulin aspart (novoLOG) injection 0-15 Units  0-15 Units Subcutaneous TID WC Jerald Kief, MD   5 Units at 06/09/19 1205  . insulin aspart (novoLOG) injection 0-5 Units  0-5 Units Subcutaneous QHS Jerald Kief, MD      . insulin aspart (novoLOG) injection 15 Units  15 Units Subcutaneous TID WC Jerald Kief, MD   15 Units at 06/09/19 1204  . insulin NPH Human (NOVOLIN N) injection 30 Units  30 Units Subcutaneous BID AC & HS Jerald Kief, MD   30 Units at 06/09/19 0900  . lisinopril (ZESTRIL) tablet 10 mg  10 mg Oral Daily Jerald Kief, MD   10 mg at 06/09/19 0850  . pantoprazole (PROTONIX) EC tablet 40 mg  40 mg Oral BID Leone Brand, NP   40 mg at 06/09/19 0850  . tamsulosin (FLOMAX) capsule 0.4 mg  0.4 mg Oral QPC supper  Jodelle Red, MD   0.4 mg at 06/08/19 1820    LABS: Lab Results  Component Value Date   WBC 10.8 (H) 06/07/2019   HGB 12.3 (L) 06/07/2019   HCT 38.4 (L) 06/07/2019   MCV 90.4 06/07/2019   PLT 246 06/07/2019      Component Value Date/Time   NA 141 06/09/2019 0337   NA 140 09/15/2018 0803   K 3.5 06/09/2019 0337   CL 100 06/09/2019 0337   CO2 29 06/09/2019 0337   GLUCOSE 175 (H) 06/09/2019 0337   BUN 19  06/09/2019 0337   BUN 11 09/15/2018 0803   CREATININE 1.00 06/09/2019 0337   CALCIUM 8.3 (L) 06/09/2019 0337   GFRNONAA >60 06/09/2019 0337   GFRAA >60 06/09/2019 0337   No results found for: INR, PROTIME No results found for: PTT  SOCIAL HISTORY: Social History   Socioeconomic History  . Marital status: Married    Spouse name: Not on file  . Number of children: Not on file  . Years of education: Not on file  . Highest education level: Not on file  Occupational History  . Not on file  Social Needs  . Financial resource strain: Not on file  . Food insecurity    Worry: Not on file    Inability: Not on file  . Transportation needs    Medical: Not on file    Non-medical: Not on file  Tobacco Use  . Smoking status: Never Smoker  . Smokeless tobacco: Never Used  Substance and Sexual Activity  . Alcohol use: No    Frequency: Never  . Drug use: Never  . Sexual activity: Not Currently  Lifestyle  . Physical activity    Days per week: Not on file    Minutes per session: Not on file  . Stress: Not on file  Relationships  . Social Musician on phone: Not on file    Gets together: Not on file    Attends religious service: Not on file    Active member of club or organization: Not on file    Attends meetings of clubs or organizations: Not on file    Relationship status: Not on file  . Intimate partner violence    Fear of current or ex partner: Not on file    Emotionally abused: Not on file    Physically abused: Not on file    Forced sexual  activity: Not on file  Other Topics Concern  . Not on file  Social History Narrative  . Not on file    FAMILY HISTORY: Family History  Problem Relation Age of Onset  . Cancer Mother   . Diabetes Mother   . Parkinson's disease Father   . Heart failure Father     REVIEW OF SYSTEMS: Reviewed with the patient as per History of present illness. Psych: Patient denies having dental phobia.  DENTAL HISTORY: CHIEF COMPLAINT: The patient was referred by Dr. Clifton James for dental consultation.  HPI: RAYSHUN MIKLOS is a 68 year old male recently diagnosed with severe aortic stenosis.  Patient with anticipated aortic valve replacement.  Patient is now seen as part of a preheart valve surgery dental protocol examination to rule out dental infection that may affect the patient stomach health and anticipated heart valve surgery.  The patient currently denies acute toothaches, swellings, or abscesses.  Patient was last seen by a dentist approximately 7 years ago and [place], [place].  This was Dr. Jule Ser.  The patient had a tooth extracted at that time with no complications.  Patient subsequently lost his dental insurance and has not been going to the dentist due to economic concerns.  Patient denies having partial dentures.  Patient denies having dental phobia.  DENTAL EXAMINATION: GENERAL: The patient is a well-developed, obese male in no acute distress. HEAD AND NECK: There is no palpable neck lymphadenopathy.  The patient denies acute TMJ symptoms. INTRAORAL EXAM: Patient has mild to moderate xerostomia.  There is no evidence of oral abscess formation. DENTITION: Patient with multiple missing teeth numbers 15,  18, 30, and 31. PERIODONTAL: The patient has chronic periodontitis with plaque and calculus accumulations, gingival recession, and incipient a moderate bone loss.  No significant tooth mobility is noted. DENTAL CARIES/SUBOPTIMAL RESTORATIONS: Patient has multiple incipient dental  caries at the gumline.  I do not see any extensive dental caries clinically.  Ideally the patient would have a full series of dental radiographs to further identify dental caries. ENDODONTIC: The patient currently denies acute pulpitis symptoms.  There is no evidence of periapical pathology noted per CT scan review. CROWN AND BRIDGE: There are no crown or bridge restorations. PROSTHODONTIC: Patient denies having partial dentures. OCCLUSION: Patient has a poor occlusal scheme secondary to multiple missing teeth, supra eruption drifting of the unopposed teeth into the edentulous areas, lack replacing the missing teeth with dental prostheses.  RADIOGRAPHIC INTERPRETATION: An orthopantogram was unable to be obtained secondary to body habitus and inability for the orthopantogram to maneuver around the patient's shoulders. CT scan was reviewed.  There are multiple missing teeth.  There is no evidence of periapical pathology or radiolucency.  There is incipient a moderate bone loss noted.  CT scan report indicated that there was no evidence of periapical pathology.  CT MAXILLOFACIAL WITHOUT CONTRAST; 3-DIMENSIONAL CT IMAGE RENDERING ON ACQUISITION WORKSTATION  TECHNIQUE: Multidetector CT imaging of the maxillofacial structures was performed. Multiplanar CT image reconstructions were also generated. Three-dimensional volume rendering was performed on the acquisition workstation for better evaluation the mandible and teeth.  COMPARISON:  None  FINDINGS: Osseous: Streak artifact from dental amalgam. The left middle maxillary and mandibular molars are absent. There is a single right mandibular molar. No periapical lucencies. No sclerotic or destructive osseous lesion. Temporomandibular joints are unremarkable. Mastoid air cells are clear.  Orbits: Unremarkable.  Sinuses: Minor paranasal sinus mucosal thickening.  Soft tissues: Negative.  Limited intracranial: No significant or  unexpected finding.  IMPRESSION: No significant dental disease identified within limitation of streak artifact.   Electronically Signed   By: Guadlupe SpanishPraneil  Patel M.D.   On: 06/09/2019 15:05  ASSESSMENTS: 1.  Severe aortic stenosis 2.  Preheart valve surgery dental protocol 3.  Mild to moderate xerostomia 4.  Incipient dental caries  5.  Chronic periodontitis with bone loss 6.  Gingival recession 7.  Accretions 8.  Multiple missing teeth 9.  Supra eruption and drifting of the unopposed teeth into the edentulous areas 10.  Poor occlusal scheme and malocclusion 11.  Need for antibiotic premedication prior to invasive dental procedures after the anticipated heart valve surgery as per American Heart Association guidelines.    PLAN/RECOMMENDATIONS: 1. I discussed the risks, benefits, and complications of various treatment options with the patient in relationship to his medical and dental conditions, anticipated heart valve surgery, and risk for endocarditis. We discussed various treatment options to include no treatment, multiple extractions with alveoloplasty, pre-prosthetic surgery as indicated, periodontal therapy, dental restorations, root canal therapy, crown and bridge therapy, implant therapy, and replacement of missing teeth as indicated.  We discussed all these treatment options in relationship to the inability to obtain an orthopantogram at this time.  As the patient has lack of acute dental symptoms and review of the CT scan by radiologist indicated no obvious periapical pathology, the patient currently wishes to DEFER any dental treatment at this time.  The patient may consider obtaining a dental cleaning at his primary dentist office in [place] as time and space permits prior to the anticipated heart valve surgery.  It was recommended that he have antibiotic  premedication prior to the dental cleaning appointment.  Patient is aware that after the heart valve surgery, he will  need antibiotic premedication prior to invasive dental procedures.  The patient, therefore, is cleared for heart valve surgery at this time.  A prescription for chlorhexidine rinse was provided with instructions to use 15 mL and a swish and spit manner twice daily after breakfast and at bedtime.  Patient was instructed to brush after meals and at bedtime and to maintain better oral hygiene to reduce risk for cavities and other dental disease.  The patient expressed understanding.   2. Discussion of findings with medical team and coordination of future medical and dental care as needed.   Charlynne Pander, DDS

## 2019-06-09 NOTE — Progress Notes (Signed)
Pt has a home CPAP that has been trying to get used to.  Pt states he doesn't want to wear CPAP for tonight.

## 2019-06-09 NOTE — Progress Notes (Signed)
Progress Note  Patient Name: Daniel Valenzuela Date of Encounter: 06/09/2019  Primary Cardiologist: Lewayne Bunting, MD   Subjective   Gradually improving. Able to walk more with PT today. No chest pain. Diuresing well.    Inpatient Medications    Scheduled Meds: . atorvastatin  80 mg Oral QPM  . buPROPion  300 mg Oral Daily  . Chlorhexidine Gluconate Cloth  6 each Topical Daily  . enoxaparin (LOVENOX) injection  90 mg Subcutaneous Q24H  . escitalopram  20 mg Oral QPM  . furosemide  80 mg Intravenous TID  . gabapentin  1,200 mg Oral QHS  . gabapentin  600 mg Oral Q breakfast  . insulin aspart  0-15 Units Subcutaneous TID WC  . insulin aspart  0-5 Units Subcutaneous QHS  . insulin aspart  15 Units Subcutaneous TID WC  . insulin NPH Human  30 Units Subcutaneous BID AC & HS  . lisinopril  10 mg Oral Daily  . pantoprazole  40 mg Oral BID  . tamsulosin  0.4 mg Oral QPC supper   Continuous Infusions:  PRN Meds: HYDROcodone-acetaminophen   Vital Signs    Vitals:   06/09/19 0829 06/09/19 0849 06/09/19 1113 06/09/19 1138  BP:  119/81  110/84  Pulse: (!) 105 (!) 107 75 71  Resp:  18  18  Temp:  98 F (36.7 C)  98 F (36.7 C)  TempSrc:  Oral  Oral  SpO2:  92%  96%  Weight:      Height:        Intake/Output Summary (Last 24 hours) at 06/09/2019 1327 Last data filed at 06/09/2019 1314 Gross per 24 hour  Intake 1440 ml  Output 2425 ml  Net -985 ml   Last 3 Weights 06/09/2019 06/08/2019 06/07/2019  Weight (lbs) 387 lb 11.2 oz 388 lb 4.8 oz 387 lb 14.4 oz  Weight (kg) 175.86 kg 176.132 kg 175.95 kg      Telemetry    Sr, 1st degree AV block (very long PR) - Personally Reviewed  ECG    No new - Personally Reviewed  Physical Exam   GEN: Well nourished, well developed in no acute distress HEENT: Normal, moist mucous membranes NECK: No JVD visible sitting upright CARDIAC: regular rhythm, distant heart sounds but regular. VASCULAR: Radial and DP pulses 2+  bilaterally. No carotid bruits RESPIRATORY:  Clear to auscultation though distant breath sounds ABDOMEN: Soft, non-tender, non-distended MUSCULOSKELETAL:  Ambulates independently SKIN: Warm and dry, trace bilateral LE edema NEUROLOGIC:  Alert and oriented x 3. No focal neuro deficits noted. PSYCHIATRIC:  Normal affect   Labs    High Sensitivity Troponin:   Recent Labs  Lab 06/04/19 0943 06/04/19 1331  TROPONINIHS 109* 110*      Chemistry Recent Labs  Lab 06/05/19 0410 06/06/19 0418 06/07/19 0450 06/08/19 0417 06/09/19 0337  NA 141 141 142 141 141  K 4.3 3.5 3.9 3.4* 3.5  CL 99 100 99 100 100  CO2 29 33* 32 28 29  GLUCOSE 199* 139* 126* 123* 175*  BUN 11 13 18 18 19   CREATININE 0.92 0.96 1.03 0.97 1.00  CALCIUM 8.9 8.7* 8.9 8.6* 8.3*  PROT 6.6 6.2* 6.0*  --   --   ALBUMIN 3.0* 2.8* 3.1*  --   --   AST 24 29 26   --   --   ALT 19 18 16   --   --   ALKPHOS 96 92 78  --   --  BILITOT 1.1 1.2 1.0  --   --   GFRNONAA >60 >60 >60 >60 >60  GFRAA >60 >60 >60 >60 >60  ANIONGAP 13 8 11 13 12      Hematology Recent Labs  Lab 06/05/19 0410 06/06/19 0418 06/07/19 0450  WBC 10.8* 9.5 10.8*  RBC 4.66 4.47 4.25  HGB 13.5 13.0 12.3*  HCT 41.9 40.3 38.4*  MCV 89.9 90.2 90.4  MCH 29.0 29.1 28.9  MCHC 32.2 32.3 32.0  RDW 15.3 15.2 15.4  PLT 288 267 246    BNP Recent Labs  Lab 06/04/19 1331  BNP 502.7*     DDimer No results for input(s): DDIMER in the last 168 hours.   Radiology    Vas 13/06/20 Carotid  Result Date: 06/08/2019 Carotid Arterial Duplex Study Indications: Pre TAVR. Limitations  Today's exam was limited due to the body habitus of the patient. Performing Technologist: 13/04/2019 RDMS, RVT  Examination Guidelines: A complete evaluation includes B-mode imaging, spectral Doppler, color Doppler, and power Doppler as needed of all accessible portions of each vessel. Bilateral testing is considered an integral part of a complete examination. Limited  examinations for reoccurring indications may be performed as noted.  Right Carotid Findings: +----------+--------+--------+--------+------------------+--------+           PSV cm/sEDV cm/sStenosisPlaque DescriptionComments +----------+--------+--------+--------+------------------+--------+ CCA Prox  57      12                                         +----------+--------+--------+--------+------------------+--------+ CCA Distal29      8                                          +----------+--------+--------+--------+------------------+--------+ ICA Prox  67      25                                         +----------+--------+--------+--------+------------------+--------+ ICA Distal63      28                                         +----------+--------+--------+--------+------------------+--------+ ECA       62      17                                         +----------+--------+--------+--------+------------------+--------+ +----------+--------+-------+----------------+-------------------+           PSV cm/sEDV cmsDescribe        Arm Pressure (mmHG) +----------+--------+-------+----------------+-------------------+ Levada Schilling             Multiphasic, WNL                    +----------+--------+-------+----------------+-------------------+ +---------+--------+--+--------+--+---------+ VertebralPSV cm/s32EDV cm/s11Antegrade +---------+--------+--+--------+--+---------+  Left Carotid Findings: +----------+--------+--------+--------+------------------+--------+           PSV cm/sEDV cm/sStenosisPlaque DescriptionComments +----------+--------+--------+--------+------------------+--------+ CCA Prox  56      14                                         +----------+--------+--------+--------+------------------+--------+  CCA Distal39      13                                         +----------+--------+--------+--------+------------------+--------+  ICA Prox  66      28                                         +----------+--------+--------+--------+------------------+--------+ ICA Distal71      29                                         +----------+--------+--------+--------+------------------+--------+ ECA       53      7                                          +----------+--------+--------+--------+------------------+--------+ +----------+--------+--------+----------------+-------------------+           PSV cm/sEDV cm/sDescribe        Arm Pressure (mmHG) +----------+--------+--------+----------------+-------------------+ Subclavian109             Multiphasic, WNL                    +----------+--------+--------+----------------+-------------------+ +---------+--------+--+--------+-+---------+ VertebralPSV cm/s28EDV cm/s9Antegrade +---------+--------+--+--------+-+---------+  Summary: Right Carotid: Velocities in the right ICA are consistent with a 1-39% stenosis. Left Carotid: Velocities in the left ICA are consistent with a 1-39% stenosis. Vertebrals: Bilateral vertebral arteries demonstrate antegrade flow. *See table(s) above for measurements and observations.  Electronically signed by Coral Else MD on 06/08/2019 at 5:53:09 PM.    Final     Cardiac Studies   Echo 06/05/19 1. Left ventricular ejection fraction, by visual estimation, is 30 to 35%. The left ventricle has moderate to severely decreased function. There is no left ventricular hypertrophy. 2. Definity contrast agent was given IV to delineate the left ventricular endocardial borders. 3. Left ventricular diastolic parameters are indeterminate. 4. Moderately dilated left ventricular internal cavity size. 5. Diffuse hypokinesis worse in the apex and septum. 6. Global right ventricle has normal systolic function.The right ventricular size is normal. No increase in right ventricular wall thickness. 7. Left atrial size was moderately dilated.  8. Right atrial size was not well visualized. 9. Moderate mitral annular calcification. 10. Severe calcification of the mitral valve leaflet(s). 11. Severe thickening of the mitral valve leaflet(s). 12. The mitral valve is degenerative. Trace mitral valve regurgitation. 13. The tricuspid valve is normal in structure. Tricuspid valve regurgitation is mild. 14. The aortic valve is tricuspid. Aortic valve regurgitation is not visualized. Severe aortic valve stenosis. 15. Severely calcified with restricted leaflet motion Likely low flow severe AS DVI 0.20 mean gradient increased from 28 mmHg to 35 mmHg peak increased from 47 to 51 mmHg since January. 16. The pulmonic valve was grossly normal. Pulmonic valve regurgitation is mild. 17. The interatrial septum was not well visualized.    Patient Profile     68 y.o. male with a hx of chronic sysotlic HF and aortic stenosis, non obstructive disease by cath 07/2018, hx of syncope, OSA, DM now admitted with acute systolic HF  Assessment & Plan    Acute on  chronic systolic HF - massive weight gain, about 40 lbs since July - echo this admit LVEF 30-35%, severe AS. In Jan 2020 LVEF 35-40%, mod AS Jan 2020 cath mild to mod CAD - I/O and weights trending down. Net negative -4.8 L, wt today 175.9kg from 180.2 kg -continue IV diuresis, likely to take significant time given weight gain. Difficult to assess by physical exam -no metoprolol given conduction disease and prior syncope -tolerating lisinopril. If renal function remains stable, could consider entresto with lisinopril washout  Aortic stenosis, now severe: -appreciate evaluation of structural heart team  Conduction disease, with very long PR: suspect he may need pacemaker with TAVR  Per primary team: OSA with CPAP  GERD Urinary retention: had foley placed, started flomax. Remove when able.  Type II diabetes on insulin  For questions or updates, please contact [facility] Please  consult [email or web address] for contact info under     Signed, Buford Dresser, MD  06/09/2019, 1:27 PM

## 2019-06-09 NOTE — Progress Notes (Signed)
Physical Therapy Treatment Patient Details Name: Daniel Valenzuela MRN: [id number] DOB: 06/20/1951 Today's Date: 06/09/2019    History of Present Illness 68 year old male with history of insulin-dependent diabetes mellitus type 2, heart failure with reduced ejection fraction (last echocardiogram on January 2020 showed ejection fraction of 35 to 40%, hypertension, hyperlipidemia admitted with acute shortness of breath worsening bilateral and abdominal edema    PT Comments    Patient received sitting at EOB with nurse tech present, pleasant and willing to participate in pre-TAVR assessment today. Able to complete functional transfers with RW and S, VC for safety, also progressed gait today to 163f with RW but very laborious and with significant DOE following this distance, VSS on room air however. See TAVR write up for further details. He was left sitting at EOB with all needs met and RN present and attending, bed alarm active this morning.     Follow Up Recommendations  Outpatient PT(cardiopulmonary)     Equipment Recommendations  None recommended by PT(has necessary DME)    Recommendations for Other Services       Precautions / Restrictions Precautions Precautions: Fall Restrictions Weight Bearing Restrictions: No    Mobility  Bed Mobility               General bed mobility comments: sitting at EOB  Transfers Overall transfer level: Needs assistance Equipment used: Rolling walker (2 wheeled) Transfers: Sit to/from Stand Sit to Stand: Supervision         General transfer comment: cues for hand placement and safety  Ambulation/Gait Ambulation/Gait assistance: Supervision Gait Distance (Feet): 140 Feet Assistive device: Rolling walker (2 wheeled) Gait Pattern/deviations: Step-through pattern;Wide base of support;Decreased stride length;Trunk flexed Gait velocity: reduced   General Gait Details: short step/stride lengths and flexed trunk, gait improved but  continues to be limited by back pain and weakness   Stairs             Wheelchair Mobility    Modified Rankin (Stroke Patients Only)       Balance Overall balance assessment: Needs assistance Sitting-balance support: No upper extremity supported;Feet supported Sitting balance-Leahy Scale: Good     Standing balance support: Bilateral upper extremity supported Standing balance-Leahy Scale: Good Standing balance comment: supervision of 1 person for static and limited dynamic balance                            Cognition Arousal/Alertness: Awake/alert Behavior During Therapy: WFL for tasks assessed/performed Overall Cognitive Status: Within Functional Limits for tasks assessed                                        Exercises      General Comments General comments (skin integrity, edema, etc.): VSS but significant DOE and back pain with exertion      Pertinent Vitals/Pain Pain Assessment: No/denies pain    Home Living                      Prior Function            PT Goals (current goals can now be found in the care plan section) Acute Rehab PT Goals Patient Stated Goal: To improve activity tolerance PT Goal Formulation: With patient Time For Goal Achievement: 06/22/19 Potential to Achieve Goals: Good Progress towards PT goals: Progressing toward goals  Frequency    Min 3X/week      PT Plan Current plan remains appropriate    Co-evaluation              AM-PAC PT "6 Clicks" Mobility   Outcome Measure  Help needed turning from your back to your side while in a flat bed without using bedrails?: None Help needed moving from lying on your back to sitting on the side of a flat bed without using bedrails?: None Help needed moving to and from a bed to a chair (including a wheelchair)?: None Help needed standing up from a chair using your arms (e.g., wheelchair or bedside chair)?: None Help needed to walk in  hospital room?: A Little Help needed climbing 3-5 steps with a railing? : A Little 6 Click Score: 22    End of Session   Activity Tolerance: Patient limited by fatigue Patient left: in bed;with call bell/phone within reach Nurse Communication: Mobility status PT Visit Diagnosis: Other abnormalities of gait and mobility (R26.89)     Time: 1132-1206 PT Time Calculation (min) (ACUTE ONLY): 34 min  Charges:  $Gait Training: 8-22 mins $Therapeutic Activity: 8-22 mins                     Windell Norfolk, DPT, CBIS  Supplemental Physical Therapist [place]    Pager [phone] Acute Rehab Office [phone]

## 2019-06-09 NOTE — Progress Notes (Signed)
06/09/2019 PT TAVR Pre-Assessment  HPI:   68 year old male with history of insulin-dependent diabetes mellitus type 2, heart failure with reduced ejection fraction (last echocardiogram on January 2020 showed ejection fraction of 35 to 40%, hypertension, hyperlipidemia admitted with acute shortness of breath worsening bilateral and abdominal edema      Clinical Impression Statement: Pt is a 67y.o.male being assessed for pre-TAVR.  Pt reports symptoms of shortness of breath and weakness with activity.  Pt has WNL strength, WNL ROM, and good balance.  Pt ambulated 1109ft during the 2 minute walk test (unable to tolerate 6MWT) requiring 0 rest breaks with max HR of 99, lowest O2 sat 99%, BP 107/79 during mobility.  5 meter walk test produced an average gait speed of 1.65ft/second which indicates high fall risk.  RPE was 13 and dyspnea was moderate during mobility.  Pt was limited by back pain, weakness, and shortness of breath.  Pt's frailty rating was 4 which is considered vulnerable.  Pt would benefit from continued PT in the acute care setting due to the above listed deficits in balance, strength, ROM, endurance and activity tolerance.    General UE/LE Strength and ROM:  Strength (0-5/5) ROM (limited/full)  R UE 5/5  full   L UE 5/5   full   R LE Hip flexor 3+/5, otherwise 5/5  Full   L LE Hip flexor 3+/5, otherwise 5/5  Full     2 Minute Walk Test:   Total Distance Walked:140 ft.    Did the pt need a rest break? No If yes, why? Pain:Yes; Fatigue:Yes; Dyspnea/O2 saturations: No Comments: limited by severe back pain with mobility (which is his baseline), LE weakness. Unable to tolerate 6MWT.    Pre-Test Post-Test  BP 96/58 107/79  HR 74 99  O2 saturations (indicated RA or L/min [place]) 97% on room air  99% on room air   Modified Borg Dyspnea Scale (0 none-10 maximal) 1 4  RPE (6 very light-20 very hard) 7 13  Comments: progressed since PT session yesterday but very laborious   5 Meter  Walk Test:  Trial 1 11 seconds  Trial 2 Unable to tolerate- back pain   Trial 3 Unable to tolerate- back pain   3 Trial Average/Gait Speed Unable to tolerate- back pain/ gait speed 1.50ft/sec (<1.8 ft/sec indicates high fall risk)  Comments: tolerated 1 trial of 5 meter walk test due to back pain  Clinical Frailty Scale (1 very fit - 9 terminally ill):  4 (</= 5/12 is considered frail)   Ann Lions PT, DPT, CBIS  Supplemental Physical Therapist [place]    Pager [phone] Acute Rehab Office [phone]

## 2019-06-09 NOTE — Progress Notes (Signed)
Patient ID: Daniel Valenzuela, male   DOB: 1951-03-24, 68 y.o.   MRN: [id number]  PROGRESS NOTE    ANDREA COLGLAZIER  IWP:[id number] DOB: [birth year] DOA: 06/04/2019 PCP: Asencion Noble, MD   Brief Narrative:  68 year old male with history of insulin-dependent diabetes mellitus type 2, heart failure with reduced ejection fraction (last echocardiogram on January 2020 showed ejection fraction of 35 to 40%, hypertension, hyperlipidemia admitted with acute shortness of breath worsening bilateral and abdominal edema.  Patient endorsed about 40 pounds weight gain.  He admitted to frequent consumption of coffee and beverages, mostly unsweetened tea which has been ongoing for the past several months.  He takes Lasix p.o. 40 mg daily which appears not to be effective as a highly makes much urine after taking Lasix. In the ED, he has elevated BNP, troponin of 110 with a stable creatinine of 0.79. Chest x-ray showed cardiomegaly with bilateral pulmonary interstitial prominence consistent with fluid overload. Echocardiogram done on 06/05/2019 showed estimated ejection fraction of 30 to 35%, when compared with echo of January 2020 showed some reduction in ejection fraction.  There is moderate to severe decreased left ventricular function.  There is severe aortic stenosis with aortic valve area less than 1 cm. Will continue with guideline directed medical therapy and consult cardiology for evaluation.  Patient getting increasing doses of IV Lasix  Assessment & Plan:   Principal Problem:   Acute combined systolic and diastolic CHF Active Problems:   Diabetic neuropathy (HCC)   High cholesterol   Shortness of breath   Type 2 diabetes mellitus with complication, with long-term current use of insulin (HCC)   Morbid obesity with BMI of 45.0-49.9, adult (HCC)   CAD (coronary artery disease)   CHF exacerbation ([place])  Clinical problems list 1.  Acute exacerbation of heart failure with reduced ejection fraction 2.   Diabetes mellitus type 2, with long-term insulin use/diabetic neuropathy 3.  Hyperlipidemia 4.  Essential hypertension 5.  Elevated troponin 6.  Morbid obesity BMI 54.9 7.  Severe aortic stenosis  1.  Acute exacerbation of heart failure with reduced ejection fraction: Patient presenting with worsening shortness of breath, bilateral pitting pedal edema, abdominal fullness and 40 pound weight gain.  Admitted to increased fluid consumption especially coffee with unsweetened tea. Takes Lasix 40 mg daily at home which appears not to have been effective.  Echocardiogram done in January 2020 showed ejection fraction of 35 to 40% by estimation.  Repeat echocardiogram today was significant for ejection fraction of 30 to 35% with moderate to severe decrease left ventricular function and severe aortic stenosis with aortic valve area less than 1 cm. -Will continue with IV Lasix 80 mg 3 times daily -Hold off on beta-blocker due to conduction disease and decompensated state of congestive heart failure.  ACE inhibitor has been restarted but will monitor closely given the patient's severe aortic stenosis -2 g sodium diet -Fluid restriction to 1500 cc per 24 hours -Daily weight -Cardiology on board.  2.  Diabetes mellitus type 2, with long-term insulin use/diabetic neuropathy.  Hemoglobin A1c 8.4 -Continue with sliding scale insulin -Continue long-acting insulin -Mealtime insulin 3 times daily -Fingersticks before meals and at bedtime -Hypoglycemic protocol -Continue with gabapentin  3.  Hyperlipidemia Continue with atorvastatin 80 mg daily  4.  Essential hypertension.  Blood pressure currently running low without compensatory/reflex tachycardia concerning for possible cardiac ischemia.  5.  Elevated troponin.  High-sensitivity troponin elevated at 110. EKG showed AV conduction delay with T wave inversion in  leads V1.  A possible septal infarct.  When compared with EKG of August 10, 2018, appears not  to be significant.  6.  Morbid obesity BMI 54.91 Nutritional referral  7.  Severe aortic stenosis -Structural heart team consulted; they have begun the evaluation process for possible TAVR including carotid ultrasound, CT of the chest abdomen and pelvis as well as a cardiac CT   DVT prophylaxis: Lovenox subcute  Code Status: Full  Family Communication: Discussed with patient  Disposition Plan: Pending clinical improvement    Consultants:   Cardiology  Procedures: 2D echocardiogram 06/05/2019: Ejection fraction 30 to 35% with severely reduced left ventricular function.   Antimicrobials: None    Subjective: Patient continues to diurese well but does endorse some dyspnea on exertion when working with physical therapy and ambulating down the hall with his walker today.  He does feel his leg swelling continues to improve.  Objective: Vitals:   06/09/19 0829 06/09/19 0849 06/09/19 1113 06/09/19 1138  BP:  119/81  110/84  Pulse: (!) 105 (!) 107 75 71  Resp:  18  18  Temp:  98 F (36.7 C)  98 F (36.7 C)  TempSrc:  Oral  Oral  SpO2:  92%  96%  Weight:      Height:        Intake/Output Summary (Last 24 hours) at 06/09/2019 1505 Last data filed at 06/09/2019 1314 Gross per 24 hour  Intake 1440 ml  Output 2425 ml  Net -985 ml   Filed Weights   06/07/19 0524 06/08/19 0342 06/09/19 0035  Weight: (!) 176 kg (!) 176.1 kg (!) 175.9 kg    Examination:  General exam: Appears calm and comfortable  Respiratory system: Distant lung sounds.  Respiratory effort normal. Cardiovascular system: S1 & S2 heard, RRR. +JVD, no murmurs, rubs, gallops or clicks.  Distant heart sounds due to morbid obesity. Gastrointestinal system: Abdomen is obese soft and nontender.  Unable to palpate any organ due to abdominal distention.   Central nervous system: Alert and oriented. No focal neurological deficits. Extremities: Symmetric 5 x 5 power.  Bilateral 1+ pitting pedal edema up to mid shin  Skin: Multiple ecchymotic areas of lower extremity  Psychiatry: Judgement and insight appear normal. Mood & affect appropriate.    LOS: 5 days    Time spent: 35    Rae Halsted, MD [facility]  If 7PM-7AM, please contact night-coverage [email or web address] Password [email or web address] 06/09/2019, 3:05 PM

## 2019-06-09 NOTE — Progress Notes (Signed)
Patient refused CPAP. Informed patient if he changed his mind to have RN to contact RT.

## 2019-06-09 NOTE — Plan of Care (Signed)

## 2019-06-09 NOTE — Plan of Care (Signed)
RD was consulted for CHF education.  Pt was provided with "Low Sodium Nutrition Therapy" handout. Education was provided on a low sodium diet. Pt stated he typically eats oatmeal with blueberries for breakfast and about 12 hours later will eat a meal of mac and cheese, fried pork chop and greens. I reviewed the pts diet recall with him, provided examples of low sodium foods, discouraged processed foods and use of the salt shaker. I encouraged fresh fruit and vegetable consumption and provided education on rinsing canned items to reduce sodium intake.   Spoke with pt about the importance of diet adherence to reduce fluid retention.  Pt was able to teach back about rinsing canned vegetables, limiting the use of the salt shaker and making a favorite dish a different way to reduce the sodium.  Expect fair to good compliance.  Pts BMI is 54.07 kg/m, obesity class 3, and is currently on a heart healthy/carb controlled diet. Eating 100% of meals.  Medications reviewed: lipitor 19m, wellbutrin 3031m lovenox 90 mg, lexapro 2068mlasix 80 mg TID, neurontin  600m27movolog 15 units, novolin 30 units, zestril 10mg30motonix.  Labs reviewed: glucose 100, Hgb A1C 8.4  No further nutrition intervention warranted at this time, please consult if further nutrition needs arise.  CynthAllen Norrisetic Intern Pager # [phone]

## 2019-06-09 NOTE — Progress Notes (Signed)
Foley catheter is removed.

## 2019-06-10 ENCOUNTER — Other Ambulatory Visit: Payer: Self-pay | Admitting: Physician Assistant

## 2019-06-10 DIAGNOSIS — R339 Retention of urine, unspecified: Secondary | ICD-10-CM

## 2019-06-10 LAB — BASIC METABOLIC PANEL
Anion gap: 12 (ref 5–15)
BUN: 22 mg/dL (ref 8–23)
CO2: 30 mmol/L (ref 22–32)
Calcium: 8.4 mg/dL — ABNORMAL LOW (ref 8.9–10.3)
Chloride: 99 mmol/L (ref 98–111)
Creatinine, Ser: 1.03 mg/dL (ref 0.61–1.24)
GFR calc Af Amer: >60 mL/min (ref >60)
GFR calc non Af Amer: >60 mL/min (ref >60)
Glucose, Bld: 168 mg/dL — ABNORMAL HIGH (ref 70–99)
Potassium: 3.4 mmol/L — ABNORMAL LOW (ref 3.5–5.1)
Sodium: 141 mmol/L (ref 135–145)

## 2019-06-10 LAB — CBC
HCT: 35.5 % — ABNORMAL LOW (ref 39.0–52.0)
Hemoglobin: 11.2 g/dL — ABNORMAL LOW (ref 13.0–17.0)
MCH: 29.1 pg (ref 26.0–34.0)
MCHC: 31.5 g/dL (ref 30.0–36.0)
MCV: 92.2 fL (ref 80.0–100.0)
Platelets: 235 10*3/uL (ref 150–400)
RBC: 3.85 MIL/uL — ABNORMAL LOW (ref 4.22–5.81)
RDW: 15.5 % (ref 11.5–15.5)
WBC: 9 10*3/uL (ref 4.0–10.5)
nRBC: 0 % (ref 0.0–0.2)

## 2019-06-10 LAB — GLUCOSE, CAPILLARY
Glucose-Capillary: 171 mg/dL — ABNORMAL HIGH (ref 70–99)
Glucose-Capillary: 183 mg/dL — ABNORMAL HIGH (ref 70–99)
Glucose-Capillary: 217 mg/dL — ABNORMAL HIGH (ref 70–99)
Glucose-Capillary: 96 mg/dL (ref 70–99)
Glucose-Capillary: 66 mg/dL — ABNORMAL LOW (ref 70–99)
Glucose-Capillary: 94 mg/dL (ref 70–99)

## 2019-06-10 MED ORDER — CHLORHEXIDINE GLUCONATE 0.12 % MT SOLN
15.0000 mL | Freq: Two times a day (BID) | OROMUCOSAL | Status: DC
  Administered 2019-06-10 – 2019-06-13 (×7): 15 mL via OROMUCOSAL
  Filled 2019-06-10 (×7): qty 15

## 2019-06-10 MED ORDER — CHLORHEXIDINE GLUCONATE 0.12 % MT SOLN: 15.0000 mL | Freq: Two times a day (BID) | OROMUCOSAL | 6 refills | Status: AC

## 2019-06-10 MED ORDER — POTASSIUM CHLORIDE CRYS ER 20 MEQ PO TBCR
20.0000 meq | EXTENDED_RELEASE_TABLET | Freq: Every day | ORAL | Status: DC
  Administered 2019-06-10 – 2019-06-13 (×4): 20 meq via ORAL
  Filled 2019-06-10 (×4): qty 1

## 2019-06-10 NOTE — Plan of Care (Signed)
  Problem: Education: Goal: Knowledge of General Education information will improve Description: Including pain rating scale, medication(s)/side effects and non-pharmacologic comfort measures Outcome: Progressing   Problem: Health Behavior/Discharge Planning: Goal: Ability to manage health-related needs will improve Outcome: Progressing   Problem: Clinical Measurements: Goal: Ability to maintain clinical measurements within normal limits will improve Outcome: Progressing   Problem: Nutrition: Goal: Adequate nutrition will be maintained Outcome: Progressing   Problem: Safety: Goal: Ability to remain free from injury will improve Outcome: Progressing   

## 2019-06-10 NOTE — Plan of Care (Signed)

## 2019-06-10 NOTE — Progress Notes (Signed)
Inpatient Diabetes Program Recommendations  AACE/ADA: New Consensus Statement on Inpatient Glycemic Control (2015)  Target Ranges:  Prepandial:   less than 140 mg/dL      Peak postprandial:   less than 180 mg/dL (1-2 hours)      Critically ill patients:  140 - 180 mg/dL   Lab Results  Component Value Date   GLUCAP 183 (H) 06/10/2019   HGBA1C 8.4 (H) 06/04/2019    Review of Glycemic Control  Results for REINHARD, SCHACK (MRN [id number]) as of 06/10/2019 10:37  Ref. Range 06/09/2019 16:04 06/09/2019 16:23 06/09/2019 21:08 06/10/2019 05:58 06/10/2019 09:44  Glucose-Capillary Latest Ref Range: 70 - 99 mg/dL 66 (L)  100 (H) 195 (H) NPH 20units 171 (H) Novolog 3units 183 (H) Novolg 15units + NPH 30units      Diabetes history: DM2 Outpatient Diabetes medications: NPH 30u BID; Novolog 0-15 TID with meals and 0-5 HS; Novolog 15u TID with meals Current orders for Inpatient glycemic control: NPH 30 BID before breakfast and HS; Novolog 0-15 TID with meals; 0-5 QHS  Inpatient Diabetes Program Recommendations:     -Please consider decreasing correction to sensitive scale 0-9 to avoid lows  Thanks, Geoffry Paradise, RN, BSN Diabetes Coordinator [phone] (8a-5p)

## 2019-06-10 NOTE — Progress Notes (Signed)
Patient ID: Daniel Valenzuela, male   DOB: 01/07/1951, 68 y.o.   MRN: [id number]  PROGRESS NOTE    BOSTON COOKSON  [id number]:[id number] DOB: Jul 07, 1951 DOA: 06/04/2019 PCP: Asencion Noble, MD   Brief Narrative:  68 year old male with history of insulin-dependent diabetes mellitus type 2, heart failure with reduced ejection fraction (last echocardiogram on January 2020 showed ejection fraction of 35 to 40%, hypertension, hyperlipidemia admitted with acute shortness of breath worsening bilateral and abdominal edema.  Patient endorsed about 40 pounds weight gain.  He admitted to frequent consumption of coffee and beverages, mostly unsweetened tea which has been ongoing for the past several months.  He takes Lasix p.o. 40 mg daily which appears not to be effective as a highly makes much urine after taking Lasix. In the ED, he has elevated BNP, troponin of 110 with a stable creatinine of 0.79. Chest x-ray showed cardiomegaly with bilateral pulmonary interstitial prominence consistent with fluid overload. Echocardiogram done on 06/05/2019 showed estimated ejection fraction of 30 to 35%, when compared with echo of January 2020 showed some reduction in ejection fraction.  There is moderate to severe decreased left ventricular function.  There is severe aortic stenosis with aortic valve area less than 1 cm. Will continue with guideline directed medical therapy and consult cardiology for evaluation.  Patient getting increasing doses of IV Lasix  Assessment & Plan:   Principal Problem:   Acute combined systolic and diastolic CHF Active Problems:   Diabetic neuropathy (HCC)   High cholesterol   Shortness of breath   Type 2 diabetes mellitus with complication, with long-term current use of insulin (HCC)   Morbid obesity with BMI of 45.0-49.9, adult (HCC)   CAD (coronary artery disease)   CHF exacerbation ([place])  Clinical problems list 1.  Acute exacerbation of heart failure with reduced ejection fraction 2.   Diabetes mellitus type 2, with long-term insulin use/diabetic neuropathy 3.  Hyperlipidemia 4.  Essential hypertension 5.  Elevated troponin 6.  Morbid obesity BMI 54.9 7.  Severe aortic stenosis  1.  Acute exacerbation of heart failure with reduced ejection fraction: Patient presenting with worsening shortness of breath, bilateral pitting pedal edema, abdominal fullness and 40 pound weight gain.  Admitted to increased fluid consumption especially coffee with unsweetened tea. Takes Lasix 40 mg daily at home which appears not to have been effective.  Echocardiogram done in January 2020 showed ejection fraction of 35 to 40% by estimation.  Repeat echocardiogram today was significant for ejection fraction of 30 to 35% with moderate to severe decrease left ventricular function and severe aortic stenosis with aortic valve area less than 1 cm. -IV Lasix 80 mg 3 times daily -Hold off on beta-blocker due to conduction disease and decompensated state of congestive heart failure.  ACE inhibitor has been restarted but will monitor closely given the patient's severe aortic stenosis -2 g sodium diet -Fluid restriction to 1500 cc per 24 hours -Daily weight -Cardiology on board.  2.  Diabetes mellitus type 2, with long-term insulin use/diabetic neuropathy.  Hemoglobin A1c 8.4 -Continue with sliding scale insulin -Continue long-acting insulin -Mealtime insulin 3 times daily -Fingersticks before meals and at bedtime -Hypoglycemic protocol -Continue with gabapentin  3.  Hyperlipidemia Continue with atorvastatin 80 mg daily  4.  Essential hypertension.  Blood pressure currently running low without compensatory/reflex tachycardia concerning for possible cardiac ischemia.  5.  Elevated troponin.  High-sensitivity troponin elevated at 110. EKG showed AV conduction delay with T wave inversion in leads V1.  A possible septal infarct.  When compared with EKG of August 10, 2018, appears not to be significant.   6.  Morbid obesity BMI 54.91 Nutritional referral  7.  Severe aortic stenosis -Structural heart team consulted; TAVR work-up has been completed  8. Urinary retention: foley recently removed but urine output decreased so is likely retaining again -tamsulosin started this admission; monitor post-void residual   DVT prophylaxis: Lovenox subcute  Code Status: Full  Family Communication: Discussed with patient  Disposition Plan: Pending clinical improvement    Consultants:   Cardiology  Procedures: 2D echocardiogram 06/05/2019: Ejection fraction 30 to 35% with severely reduced left ventricular function.   Antimicrobials: None    Subjective: Patient denies any acute complaints and continues to work with physical therapy.  He does endorse dyspnea on exertion but he states this is improving with each passing day.  Since removal of the Foley catheter patient admits to having some issues with voiding again but not as bad as prior.  Objective: Vitals:   06/09/19 2030 06/10/19 0501 06/10/19 0805 06/10/19 1124  BP: 114/63 122/79 114/74 119/88  Pulse: 69 73 72 73  Resp: 17 16 18 20   Temp: 97.7 F (36.5 C) (!) 97.5 F (36.4 C) 97.6 F (36.4 C) 97.6 F (36.4 C)  TempSrc: Oral Oral Oral Oral  SpO2: (!) 87% 94% 95% 94%  Weight:  (!) 176.7 kg    Height:        Intake/Output Summary (Last 24 hours) at 06/10/2019 1544 Last data filed at 06/10/2019 1543 Gross per 24 hour  Intake 1440 ml  Output 1000 ml  Net 440 ml   Filed Weights   06/08/19 0342 06/09/19 0035 06/10/19 0501  Weight: (!) 176.1 kg (!) 175.9 kg (!) 176.7 kg    Examination:  General exam: Appears calm and comfortable  Respiratory system: Distant lung sounds.  Respiratory effort normal. Cardiovascular system: S1 & S2 heard, RRR. +JVD, no murmurs, rubs, gallops or clicks.  Distant heart sounds due to morbid obesity. Gastrointestinal system: Abdomen is obese soft and nontender.  Unable to palpate any organ due to  abdominal distention.   Central nervous system: Alert and oriented. No focal neurological deficits. Extremities: Symmetric 5 x 5 power.  Trace pitting pedal edema up to mid shin Skin: Multiple ecchymotic areas of lower extremity  Psychiatry: Judgement and insight appear normal. Mood & affect appropriate.    LOS: 6 days    Time spent: 25 minutes    13/12/20, MD [facility]  If 7PM-7AM, please contact night-coverage [email or web address] Password [facility] 06/10/2019, 3:44 PM

## 2019-06-10 NOTE — Progress Notes (Signed)
Progress Note  Patient Name: Daniel Valenzuela Date of Encounter: 06/10/2019  Primary Cardiologist: Lewayne Bunting, MD   Subjective   Foley catheter removed yesterday. Last night, felt that he had to urinate, had difficulty going. Was unable to use urinal. Was able to get some flow with standing and using bedside commode. Unsure if he had any residual. No documented output yet today but feels like he has to urinate.   No chest pain. Breathing stable.     Inpatient Medications    Scheduled Meds:  atorvastatin  80 mg Oral QPM   buPROPion  300 mg Oral Daily   chlorhexidine  15 mL Mouth/Throat BID   Chlorhexidine Gluconate Cloth  6 each Topical Daily   enoxaparin (LOVENOX) injection  90 mg Subcutaneous Q24H   escitalopram  20 mg Oral QPM   furosemide  80 mg Intravenous TID   gabapentin  1,200 mg Oral QHS   gabapentin  600 mg Oral Q breakfast   insulin aspart  0-15 Units Subcutaneous TID WC   insulin aspart  0-5 Units Subcutaneous QHS   insulin aspart  15 Units Subcutaneous TID WC   insulin NPH Human  30 Units Subcutaneous BID AC & HS   lisinopril  10 mg Oral Daily   pantoprazole  40 mg Oral BID   potassium chloride  20 mEq Oral Daily   tamsulosin  0.4 mg Oral QPC supper   Continuous Infusions:  PRN Meds: HYDROcodone-acetaminophen   Vital Signs    Vitals:   06/09/19 2030 06/10/19 0501 06/10/19 0805 06/10/19 1124  BP: 114/63 122/79 114/74 119/88  Pulse: 69 73 72 73  Resp: 17 16 18 20   Temp: 97.7 F (36.5 C) (!) 97.5 F (36.4 C) 97.6 F (36.4 C) 97.6 F (36.4 C)  TempSrc: Oral Oral Oral Oral  SpO2: (!) 87% 94% 95% 94%  Weight:  (!) 176.7 kg    Height:        Intake/Output Summary (Last 24 hours) at 06/10/2019 1223 Last data filed at 06/10/2019 1200 Gross per 24 hour  Intake 1200 ml  Output 1200 ml  Net 0 ml   Last 3 Weights 06/10/2019 06/09/2019 06/08/2019  Weight (lbs) 389 lb 8 oz 387 lb 11.2 oz 388 lb 4.8 oz  Weight (kg) 176.676 kg 175.86  kg 176.132 kg      Telemetry    Sr, 1st degree AV block (very long PR) - Personally Reviewed  ECG    No new - Personally Reviewed  Physical Exam   GEN: Well nourished, well developed in no acute distress HEENT: Normal, moist mucous membranes NECK: No JVD visible sitting up. CARDIAC: regular rhythm, normal S1 and S2 though distant VASCULAR: Radial and DP pulses 2+ bilaterally. No carotid bruits RESPIRATORY:  Clear to auscultation though distant breath sounds ABDOMEN: Soft, non-tender, non-distended MUSCULOSKELETAL:  Ambulates independently SKIN: Warm and dry, trace bilateral LE edema NEUROLOGIC:  Alert and oriented x 3. No focal neuro deficits noted. PSYCHIATRIC:  Normal affect   Labs    High Sensitivity Troponin:   Recent Labs  Lab 06/04/19 0943 06/04/19 1331  TROPONINIHS 109* 110*      Chemistry Recent Labs  Lab 06/05/19 0410 06/06/19 0418 06/07/19 0450 06/08/19 0417 06/09/19 0337 06/10/19 0429  NA 141 141 142 141 141 141  K 4.3 3.5 3.9 3.4* 3.5 3.4*  CL 99 100 99 100 100 99  CO2 29 33* 32 28 29 30   GLUCOSE 199* 139* 126* 123* 175* 168*  BUN 11 13 18 18 19 22   CREATININE 0.92 0.96 1.03 0.97 1.00 1.03  CALCIUM 8.9 8.7* 8.9 8.6* 8.3* 8.4*  PROT 6.6 6.2* 6.0*  --   --   --   ALBUMIN 3.0* 2.8* 3.1*  --   --   --   AST 24 29 26   --   --   --   ALT 19 18 16   --   --   --   ALKPHOS 96 92 78  --   --   --   BILITOT 1.1 1.2 1.0  --   --   --   GFRNONAA >60 >60 >60 >60 >60 >60  GFRAA >60 >60 >60 >60 >60 >60  ANIONGAP 13 8 11 13 12 12      Hematology Recent Labs  Lab 06/06/19 0418 06/07/19 0450 06/10/19 0429  WBC 9.5 10.8* 9.0  RBC 4.47 4.25 3.85*  HGB 13.0 12.3* 11.2*  HCT 40.3 38.4* 35.5*  MCV 90.2 90.4 92.2  MCH 29.1 28.9 29.1  MCHC 32.3 32.0 31.5  RDW 15.2 15.4 15.5  PLT 267 246 235    BNP Recent Labs  Lab 06/04/19 1331  BNP 502.7*     DDimer No results for input(s): DDIMER in the last 168 hours.   Radiology    Ct 3d Recon At  Scanner  Result Date: 06/09/2019 CLINICAL DATA:  Dental disease EXAM: CT MAXILLOFACIAL WITHOUT CONTRAST; 3-DIMENSIONAL CT IMAGE RENDERING ON ACQUISITION WORKSTATION TECHNIQUE: Multidetector CT imaging of the maxillofacial structures was performed. Multiplanar CT image reconstructions were also generated. Three-dimensional volume rendering was performed on the acquisition workstation for better evaluation the mandible and teeth. COMPARISON:  None FINDINGS: Osseous: Streak artifact from dental amalgam. The left middle maxillary and mandibular molars are absent. There is a single right mandibular molar. No periapical lucencies. No sclerotic or destructive osseous lesion. Temporomandibular joints are unremarkable. Mastoid air cells are clear. Orbits: Unremarkable. Sinuses: Minor paranasal sinus mucosal thickening. Soft tissues: Negative. Limited intracranial: No significant or unexpected finding. IMPRESSION: No significant dental disease identified within limitation of streak artifact. Electronically Signed   By: Guadlupe Spanish M.D.   On: 06/09/2019 15:05   Ct Coronary Morph W/cta Cor W/score W/ca W/cm &/or Wo/cm  Addendum Date: 06/09/2019   ADDENDUM REPORT: 06/09/2019 16:32 EXAM: OVER-READ INTERPRETATION  CT CHEST The following report is an over-read performed by radiologist Dr. Royal Piedra [facility] on 06/09/2019. This over-read does not include interpretation of cardiac or coronary anatomy or pathology. The cardiac CTA interpretation by the cardiologist is attached. COMPARISON:  None. FINDINGS: Extracardiac findings were described separately under dictation for contemporaneously obtained CTA chest, abdomen and pelvis. IMPRESSION: Please see separate dictation for contemporaneously obtained CTA chest, abdomen and pelvis 06/09/2019 for full description of relevant extracardiac findings. Electronically Signed   By: Trudie Reed M.D.   On: 06/09/2019 16:32   Result Date:  06/09/2019 CLINICAL DATA:  68 year old male with severe aortic stenosis being evaluated for a TAVR procedure. EXAM: Cardiac TAVR CT TECHNIQUE: The patient was scanned on a [employer]. A 120 kV retrospective scan was triggered in the descending thoracic aorta at 111 HU's. Gantry rotation speed was 250 msecs and collimation was .6 mm. No beta blockade or nitro were given. The 3D data set was reconstructed in 5% intervals of the R-R cycle. Systolic and diastolic phases were analyzed on a dedicated work station using MPR, MIP and VRT modes. The patient received 80 cc of contrast. FINDINGS: Aortic  Valve: Trileaflet, with severely calcified and thickened leaflets and severely restricted leaflet opening. Aorta: Normal size with minimal atherosclerotic plaque and calcifications. Sinotubular Junction: 32 x 31 mm Ascending Thoracic Aorta: 38 x 37 mm Aortic Arch: 32 x 31 mm Descending Thoracic Aorta: 28 x 26 mm Sinus of Valsalva Measurements: Non-coronary: 35 mm Right -coronary: 34 mm Left -coronary: 35 mm Coronary Artery Height above Annulus: Left Main: 15 mm Right Coronary: 17 mm Virtual Basal Annulus Measurements: Maximum/Minimum Diameter: 31.5 x 28.4 mm Mean Diameter: 29.8 mm Perimeter: 94.5 mm Area: 696 mm2 Optimum Fluoroscopic Angle for Delivery: RAO 8 CRA 8. IMPRESSION: 1. Trileaflet, with severely calcified and thickened leaflets and severely restricted leaflet opening. Aortic valve calcium score is 3437 consistent with severe aortic stenosis. Patient's scan quality is affected by patient's size, annular size might be overestimated and appears too large for a 29 mm Edwards-SAPIEN 3 valve. Given patient's size, this patient might benefit from a 34 mm Evolut R CoreValve. 2. Sufficient coronary to annulus distance. 3. Optimum Fluoroscopic Angle for Delivery: RAO 8 CRA 8. 4. No thrombus in the left atrial appendage. 5. Mildly dilated pulmonary artery measuring 33 mm. Electronically Signed: By: Tobias Alexander  On: 06/09/2019 15:19   Ct Angio Chest Aorta W/cm &/or Wo/cm  Result Date: 06/09/2019 CLINICAL DATA:  68 year old male with history of severe aortic valve stenosis. Preprocedural study prior to potential transcatheter aortic valve replacement (TAVR) procedure. EXAM: CT ANGIOGRAPHY CHEST, ABDOMEN AND PELVIS TECHNIQUE: Multidetector CT imaging through the chest, abdomen and pelvis was performed using the standard protocol during bolus administration of intravenous contrast. Multiplanar reconstructed images and MIPs were obtained and reviewed to evaluate the vascular anatomy. CONTRAST:  OMNIPAQUE IOHEXOL 350 MG/ML SOLN COMPARISON:  No priors. FINDINGS: CTA CHEST FINDINGS Cardiovascular: Heart size is enlarged. There is no significant pericardial fluid, thickening or pericardial calcification. There is aortic atherosclerosis, as well as atherosclerosis of the great vessels of the mediastinum and the coronary arteries, including calcified atherosclerotic plaque in the left main, left anterior descending, left circumflex and right coronary arteries. Severe thickening calcification of the aortic valve. Moderate calcifications of the mitral annulus. Mediastinum/Lymph Nodes: Multiple prominent borderline enlarged and mildly enlarged mediastinal lymph nodes, largest of which measures 1.5 cm in short axis in the right paratracheal nodal station. No hilar lymphadenopathy. Patulous distal esophagus. No axillary lymphadenopathy. Lungs/Pleura: Patchy multifocal areas of peribronchovascular ground-glass attenuation throughout the right lung with some peribronchovascular micronodularity, largest of which is in the right lower lobe measuring 8 x 5 mm (mean diameter of 6.5 mm). No other larger more suspicious appearing pulmonary nodules or masses are noted. No pleural effusions. Musculoskeletal/Soft Tissues: There are no aggressive appearing lytic or blastic lesions noted in the visualized portions of the skeleton. CTA  ABDOMEN AND PELVIS FINDINGS Hepatobiliary: No suspicious cystic or solid hepatic lesions. No intra or extrahepatic biliary ductal dilatation. Gallbladder is normal in appearance. Pancreas: No pancreatic mass. No pancreatic ductal dilatation. No pancreatic or peripancreatic fluid collections or inflammatory changes. Spleen: Unremarkable. Adrenals/Urinary Tract: 2.1 cm intermediate attenuation (38 HU) lesion (axial image 136 of series 15) in the posterior aspect of the interpolar region of the right kidney, incompletely characterized. Left kidney and bilateral adrenal glands are normal in appearance. No hydroureteronephrosis. Urinary bladder is nearly decompressed, with a Foley balloon catheter in place, and small amount of gas non dependently in the lumen of the urinary bladder which is presumably iatrogenic. Stomach/Bowel: LapBand in place around the proximal stomach. Stomach is  otherwise unremarkable in appearance. No pathologic dilatation of small bowel or colon. Normal appendix. Vascular/Lymphatic: Mild aortic atherosclerosis, without evidence of aneurysm or dissection in the abdominal or pelvic vasculature. Vascular findings and measurements pertinent to potential TAVR procedure, as detailed below. No lymphadenopathy noted in the abdomen or pelvis. Reproductive: Prostate gland and seminal vesicles are unremarkable in appearance. Other: No significant volume of ascites.  No pneumoperitoneum. Musculoskeletal: There are no aggressive appearing lytic or blastic lesions noted in the visualized portions of the skeleton. VASCULAR MEASUREMENTS PERTINENT TO TAVR: AORTA: Minimal Aortic Diameter-18 x 18 mm Severity of Aortic Calcification-mild RIGHT PELVIS: Right Common Iliac Artery - Minimal Diameter-13.1 x 11.5 mm Tortuosity-mild Calcification-minimal Right External Iliac Artery - Minimal Diameter-8.4 x 7.9 mm Tortuosity-moderate to severe Calcification-none Right Common Femoral Artery - Minimal Diameter-10.0 x 9.2 mm  Tortuosity-mild Calcification - none LEFT PELVIS: Left Common Iliac Artery - Minimal Diameter-13.4 x 11.6 mm Tortuosity-mild Calcification-minimal Left External Iliac Artery - Minimal Diameter-8.3 x 8.6 mm Tortuosity-moderate Calcification - none Left Common Femoral Artery - Minimal Diameter-7.8 x 7.7 mm Tortuosity-mild Calcification - none Review of the MIP images confirms the above findings. IMPRESSION: 1. Vascular findings and measurements pertinent to potential TAVR procedure, as detailed above. 2. Severe thickening calcification of the aortic valve, compatible with the reported clinical history of severe aortic stenosis. 3. Aortic atherosclerosis, in addition to left main and 3 vessel coronary artery disease. 4. Cardiomegaly. 5. Multiple borderline enlarged and mildly enlarged mediastinal lymph nodes measuring up to 1.5 cm in the right paratracheal nodal station. This is nonspecific, but favored to be reactive given the findings in the right lung which likely reflect mild multilobar bronchopneumonia. 6. Multiple small pulmonary nodules in the right lung, largest of which has a mean diameter of 6.5 mm in the right lower lobe. Non-contrast chest CT at 3-6 months is recommended. If the nodules are stable at time of repeat CT, then future CT at 18-24 months (from today's scan) is considered optional for low-risk patients, but is recommended for high-risk patients. This recommendation follows the consensus statement: Guidelines for Management of Incidental Pulmonary Nodules Detected on CT Images: From the Fleischner Society 2017; Radiology 2017; 284:228-243. 7. 2.1 cm intermediate attenuation lesion in the right kidney, incompletely characterized on today's examination. The possibility of a small renal neoplasm is not excluded, and further characterization with nonemergent abdominal MRI with and without IV gadolinium is recommended in the near future. Electronically Signed   By: Vinnie Langton M.D.   On:  06/09/2019 15:35   Vas US Carotid  Result Date: 06/08/2019 Carotid Arterial Duplex Study Indications: Pre TAVR. Limitations  Today's exam was limited due to the body habitus of the patient. Performing Technologist: Antonieta Pert RDMS, RVT  Examination Guidelines: A complete evaluation includes B-mode imaging, spectral Doppler, color Doppler, and power Doppler as needed of all accessible portions of each vessel. Bilateral testing is considered an integral part of a complete examination. Limited examinations for reoccurring indications may be performed as noted.  Right Carotid Findings: +----------+--------+--------+--------+------------------+--------+             PSV cm/s EDV cm/s Stenosis Plaque Description Comments  +----------+--------+--------+--------+------------------+--------+  CCA Prox   57       12                                             +----------+--------+--------+--------+------------------+--------+  CCA Distal 29       8                                              +----------+--------+--------+--------+------------------+--------+  ICA Prox   67       25                                             +----------+--------+--------+--------+------------------+--------+  ICA Distal 63       28                                             +----------+--------+--------+--------+------------------+--------+  ECA        62       17                                             +----------+--------+--------+--------+------------------+--------+ +----------+--------+-------+----------------+-------------------+             PSV cm/s EDV cms Describe         Arm Pressure (mmHG)  +----------+--------+-------+----------------+-------------------+  Subclavian 78               Multiphasic, WNL                      +----------+--------+-------+----------------+-------------------+ +---------+--------+--+--------+--+---------+  Vertebral PSV cm/s 32 EDV cm/s 11 Antegrade   +---------+--------+--+--------+--+---------+  Left Carotid Findings: +----------+--------+--------+--------+------------------+--------+             PSV cm/s EDV cm/s Stenosis Plaque Description Comments  +----------+--------+--------+--------+------------------+--------+  CCA Prox   56       14                                             +----------+--------+--------+--------+------------------+--------+  CCA Distal 39       13                                             +----------+--------+--------+--------+------------------+--------+  ICA Prox   66       28                                             +----------+--------+--------+--------+------------------+--------+  ICA Distal 71       29                                             +----------+--------+--------+--------+------------------+--------+  ECA        53       7                                              +----------+--------+--------+--------+------------------+--------+ +----------+--------+--------+----------------+-------------------+  PSV cm/s EDV cm/s Describe         Arm Pressure (mmHG)  +----------+--------+--------+----------------+-------------------+  Subclavian 109               Multiphasic, WNL                      +----------+--------+--------+----------------+-------------------+ +---------+--------+--+--------+-+---------+  Vertebral PSV cm/s 28 EDV cm/s 9 Antegrade  +---------+--------+--+--------+-+---------+  Summary: Right Carotid: Velocities in the right ICA are consistent with a 1-39% stenosis. Left Carotid: Velocities in the left ICA are consistent with a 1-39% stenosis. Vertebrals: Bilateral vertebral arteries demonstrate antegrade flow. *See table(s) above for measurements and observations.  Electronically signed by Coral ElseVance Brabham MD on 06/08/2019 at 5:53:09 PM.    Final    Ct Maxillofacial Wo Contrast  Result Date: 06/09/2019 CLINICAL DATA:  Dental disease EXAM: CT MAXILLOFACIAL WITHOUT CONTRAST;  3-DIMENSIONAL CT IMAGE RENDERING ON ACQUISITION WORKSTATION TECHNIQUE: Multidetector CT imaging of the maxillofacial structures was performed. Multiplanar CT image reconstructions were also generated. Three-dimensional volume rendering was performed on the acquisition workstation for better evaluation the mandible and teeth. COMPARISON:  None FINDINGS: Osseous: Streak artifact from dental amalgam. The left middle maxillary and mandibular molars are absent. There is a single right mandibular molar. No periapical lucencies. No sclerotic or destructive osseous lesion. Temporomandibular joints are unremarkable. Mastoid air cells are clear. Orbits: Unremarkable. Sinuses: Minor paranasal sinus mucosal thickening. Soft tissues: Negative. Limited intracranial: No significant or unexpected finding. IMPRESSION: No significant dental disease identified within limitation of streak artifact. Electronically Signed   By: Guadlupe SpanishPraneil  Patel M.D.   On: 06/09/2019 15:05   Ct Angio Abd/pel W/ And/or W/o  Result Date: 06/09/2019 CLINICAL DATA:  68 year old male with history of severe aortic valve stenosis. Preprocedural study prior to potential transcatheter aortic valve replacement (TAVR) procedure. EXAM: CT ANGIOGRAPHY CHEST, ABDOMEN AND PELVIS TECHNIQUE: Multidetector CT imaging through the chest, abdomen and pelvis was performed using the standard protocol during bolus administration of intravenous contrast. Multiplanar reconstructed images and MIPs were obtained and reviewed to evaluate the vascular anatomy. CONTRAST:  150mL OMNIPAQUE IOHEXOL 350 MG/ML SOLN COMPARISON:  No priors. FINDINGS: CTA CHEST FINDINGS Cardiovascular: Heart size is enlarged. There is no significant pericardial fluid, thickening or pericardial calcification. There is aortic atherosclerosis, as well as atherosclerosis of the great vessels of the mediastinum and the coronary arteries, including calcified atherosclerotic plaque in the left main, left anterior  descending, left circumflex and right coronary arteries. Severe thickening calcification of the aortic valve. Moderate calcifications of the mitral annulus. Mediastinum/Lymph Nodes: Multiple prominent borderline enlarged and mildly enlarged mediastinal lymph nodes, largest of which measures 1.5 cm in short axis in the right paratracheal nodal station. No hilar lymphadenopathy. Patulous distal esophagus. No axillary lymphadenopathy. Lungs/Pleura: Patchy multifocal areas of peribronchovascular ground-glass attenuation throughout the right lung with some peribronchovascular micronodularity, largest of which is in the right lower lobe measuring 8 x 5 mm (mean diameter of 6.5 mm). No other larger more suspicious appearing pulmonary nodules or masses are noted. No pleural effusions. Musculoskeletal/Soft Tissues: There are no aggressive appearing lytic or blastic lesions noted in the visualized portions of the skeleton. CTA ABDOMEN AND PELVIS FINDINGS Hepatobiliary: No suspicious cystic or solid hepatic lesions. No intra or extrahepatic biliary ductal dilatation. Gallbladder is normal in appearance. Pancreas: No pancreatic mass. No pancreatic ductal dilatation. No pancreatic or peripancreatic fluid collections or inflammatory changes. Spleen: Unremarkable. Adrenals/Urinary Tract: 2.1 cm intermediate attenuation (38 HU) lesion (axial image 136 of series  15) in the posterior aspect of the interpolar region of the right kidney, incompletely characterized. Left kidney and bilateral adrenal glands are normal in appearance. No hydroureteronephrosis. Urinary bladder is nearly decompressed, with a Foley balloon catheter in place, and small amount of gas non dependently in the lumen of the urinary bladder which is presumably iatrogenic. Stomach/Bowel: LapBand in place around the proximal stomach. Stomach is otherwise unremarkable in appearance. No pathologic dilatation of small bowel or colon. Normal appendix. Vascular/Lymphatic:  Mild aortic atherosclerosis, without evidence of aneurysm or dissection in the abdominal or pelvic vasculature. Vascular findings and measurements pertinent to potential TAVR procedure, as detailed below. No lymphadenopathy noted in the abdomen or pelvis. Reproductive: Prostate gland and seminal vesicles are unremarkable in appearance. Other: No significant volume of ascites.  No pneumoperitoneum. Musculoskeletal: There are no aggressive appearing lytic or blastic lesions noted in the visualized portions of the skeleton. VASCULAR MEASUREMENTS PERTINENT TO TAVR: AORTA: Minimal Aortic Diameter-18 x 18 mm Severity of Aortic Calcification-mild RIGHT PELVIS: Right Common Iliac Artery - Minimal Diameter-13.1 x 11.5 mm Tortuosity-mild Calcification-minimal Right External Iliac Artery - Minimal Diameter-8.4 x 7.9 mm Tortuosity-moderate to severe Calcification-none Right Common Femoral Artery - Minimal Diameter-10.0 x 9.2 mm Tortuosity-mild Calcification - none LEFT PELVIS: Left Common Iliac Artery - Minimal Diameter-13.4 x 11.6 mm Tortuosity-mild Calcification-minimal Left External Iliac Artery - Minimal Diameter-8.3 x 8.6 mm Tortuosity-moderate Calcification - none Left Common Femoral Artery - Minimal Diameter-7.8 x 7.7 mm Tortuosity-mild Calcification - none Review of the MIP images confirms the above findings. IMPRESSION: 1. Vascular findings and measurements pertinent to potential TAVR procedure, as detailed above. 2. Severe thickening calcification of the aortic valve, compatible with the reported clinical history of severe aortic stenosis. 3. Aortic atherosclerosis, in addition to left main and 3 vessel coronary artery disease. 4. Cardiomegaly. 5. Multiple borderline enlarged and mildly enlarged mediastinal lymph nodes measuring up to 1.5 cm in the right paratracheal nodal station. This is nonspecific, but favored to be reactive given the findings in the right lung which likely reflect mild multilobar  bronchopneumonia. 6. Multiple small pulmonary nodules in the right lung, largest of which has a mean diameter of 6.5 mm in the right lower lobe. Non-contrast chest CT at 3-6 months is recommended. If the nodules are stable at time of repeat CT, then future CT at 18-24 months (from today's scan) is considered optional for low-risk patients, but is recommended for high-risk patients. This recommendation follows the consensus statement: Guidelines for Management of Incidental Pulmonary Nodules Detected on CT Images: From the Fleischner Society 2017; Radiology 2017; 284:228-243. 7. 2.1 cm intermediate attenuation lesion in the right kidney, incompletely characterized on today's examination. The possibility of a small renal neoplasm is not excluded, and further characterization with nonemergent abdominal MRI with and without IV gadolinium is recommended in the near future. Electronically Signed   By: Trudie Reed M.D.   On: 06/09/2019 15:35    Cardiac Studies   Echo 06/05/19 1. Left ventricular ejection fraction, by visual estimation, is 30 to 35%. The left ventricle has moderate to severely decreased function. There is no left ventricular hypertrophy. 2. Definity contrast agent was given IV to delineate the left ventricular endocardial borders. 3. Left ventricular diastolic parameters are indeterminate. 4. Moderately dilated left ventricular internal cavity size. 5. Diffuse hypokinesis worse in the apex and septum. 6. Global right ventricle has normal systolic function.The right ventricular size is normal. No increase in right ventricular wall thickness. 7. Left atrial size was moderately  dilated. 8. Right atrial size was not well visualized. 9. Moderate mitral annular calcification. 10. Severe calcification of the mitral valve leaflet(s). 11. Severe thickening of the mitral valve leaflet(s). 12. The mitral valve is degenerative. Trace mitral valve regurgitation. 13. The tricuspid valve is  normal in structure. Tricuspid valve regurgitation is mild. 14. The aortic valve is tricuspid. Aortic valve regurgitation is not visualized. Severe aortic valve stenosis. 15. Severely calcified with restricted leaflet motion Likely low flow severe AS DVI 0.20 mean gradient increased from 28 mmHg to 35 mmHg peak increased from 47 to 51 mmHg since January. 16. The pulmonic valve was grossly normal. Pulmonic valve regurgitation is mild. 17. The interatrial septum was not well visualized.    Patient Profile     68 y.o. male with a hx of chronic sysotlic HF and aortic stenosis, non obstructive disease by cath 07/2018, hx of syncope, OSA, DM now admitted with acute systolic HF  Assessment & Plan    Acute on chronic systolic and diastolic heart failure - massive weight gain, about 40 lbs since July - echo this admit LVEF 30-35%, severe AS. In Jan 2020 LVEF 35-40%, mod AS Jan 2020 cath mild to mod CAD - I/O and weights difficult. Net negative -3.6 L total, wt today up slightly to 176.7 from 175.9kg, peak 180.2 kg -per chart, only 700 cc urine plus one uncharted yesterday. None yet charted today. -I am concerned for urinary retention again given his significant difficulty yesterday and drop in urine output. He will try to urinate now, and I've asked for a post void residual afterward. Would like to avoid indwelling catheter, but if he continues to have retention may not have a choice. I started tamsulosin this admission but has only received for a few days. -continue IV diuresis, which is complicated by his urinary issues -no metoprolol given conduction disease and prior syncope -tolerating lisinopril. If renal function remains stable, could consider entresto with lisinopril washout  Aortic stenosis, now severe: -appreciate evaluation of structural heart team -ct tavr done 11/11  Conduction disease, with very long PR: suspect he may need pacemaker with TAVR  Per primary team: OSA with  CPAP  GERD Urinary retention: as above Type II diabetes on insulin  For questions or updates, please contact [facility] Please consult [email or web address] for contact info under     Signed, Jodelle Red, MD  06/10/2019, 12:23 PM

## 2019-06-10 NOTE — Progress Notes (Signed)
Physical Therapy Treatment Patient Details Name: Daniel Valenzuela MRN: [id number] DOB: [birth year] Today's Date: 06/10/2019    History of Present Illness 68 year old male with history of insulin-dependent diabetes mellitus type 2, heart failure with reduced ejection fraction (last echocardiogram on January 2020 showed ejection fraction of 35 to 40%, hypertension, hyperlipidemia admitted with acute shortness of breath worsening bilateral and abdominal edema    PT Comments    Pt tolerated treatment well, ambulating for increased distance without significant symptoms. Pt does report feeling weak in the legs with prolonged ambulation, possibly due to generalized LE weakness and impaired endurance. Pt will benefit from continued acute PT services to assess stair negotiation and improve tolerance for functional mobility.   Follow Up Recommendations  Outpatient PT(cardiopulmonary PT)     Equipment Recommendations  None recommended by PT    Recommendations for Other Services       Precautions / Restrictions Precautions Precautions: Fall Restrictions Weight Bearing Restrictions: No    Mobility  Bed Mobility Overal bed mobility: (pt sitting at edge of bed upon arrival)                Transfers Overall transfer level: Modified independent Equipment used: Rolling walker (2 wheeled) Transfers: Sit to/from Stand Sit to Stand: Modified independent (Device/Increase time)            Ambulation/Gait Ambulation/Gait assistance: Supervision Gait Distance (Feet): 150 Feet Assistive device: Rolling walker (2 wheeled) Gait Pattern/deviations: Step-to pattern;Wide base of support;Trunk flexed Gait velocity: reduced Gait velocity interpretation: 1.31 - 2.62 ft/sec, indicative of limited community ambulator General Gait Details: pt with short step through gait, widened BOS, increased lateral sway during turns   Optometrist    Modified Rankin  (Stroke Patients Only)       Balance Overall balance assessment: Needs assistance Sitting-balance support: No upper extremity supported;Feet supported Sitting balance-Leahy Scale: Normal     Standing balance support: Bilateral upper extremity supported Standing balance-Leahy Scale: Good Standing balance comment: supervision for dynamic balance                            Cognition Arousal/Alertness: Awake/alert Behavior During Therapy: WFL for tasks assessed/performed Overall Cognitive Status: Within Functional Limits for tasks assessed                                        Exercises      General Comments General comments (skin integrity, edema, etc.): VSS, pt reports increased work of breathing at end of ambulation but recovers immediately with sitting      Pertinent Vitals/Pain Pain Assessment: No/denies pain    Home Living                      Prior Function            PT Goals (current goals can now be found in the care plan section) Acute Rehab PT Goals Patient Stated Goal: To improve activity tolerance Progress towards PT goals: Progressing toward goals    Frequency    Min 3X/week      PT Plan Current plan remains appropriate    Co-evaluation              AM-PAC PT "6 Clicks" Mobility   Outcome Measure  Help needed turning from your back to your side while in a flat bed without using bedrails?: None Help needed moving from lying on your back to sitting on the side of a flat bed without using bedrails?: None Help needed moving to and from a bed to a chair (including a wheelchair)?: None Help needed standing up from a chair using your arms (e.g., wheelchair or bedside chair)?: None Help needed to walk in hospital room?: None Help needed climbing 3-5 steps with a railing? : A Little 6 Click Score: 23    End of Session Equipment Utilized During Treatment: (none) Activity Tolerance: Patient tolerated  treatment well Patient left: in bed;with call bell/phone within reach Nurse Communication: Mobility status PT Visit Diagnosis: Other abnormalities of gait and mobility (R26.89)     Time: 3704-8889 PT Time Calculation (min) (ACUTE ONLY): 9 min  Charges:  $Gait Training: 8-22 mins                     Zenaida Niece, PT, DPT Acute Rehabilitation Pager: [phone]    Zenaida Niece 06/10/2019, 4:40 PM

## 2019-06-11 DIAGNOSIS — Z6841 Body Mass Index (BMI) 40.0 and over, adult: Secondary | ICD-10-CM

## 2019-06-11 LAB — BASIC METABOLIC PANEL
Anion gap: 12 (ref 5–15)
BUN: 19 mg/dL (ref 8–23)
CO2: 31 mmol/L (ref 22–32)
Calcium: 8.7 mg/dL — ABNORMAL LOW (ref 8.9–10.3)
Chloride: 97 mmol/L — ABNORMAL LOW (ref 98–111)
Creatinine, Ser: 0.97 mg/dL (ref 0.61–1.24)
GFR calc Af Amer: >60 mL/min (ref >60)
GFR calc non Af Amer: >60 mL/min (ref >60)
Glucose, Bld: 133 mg/dL — ABNORMAL HIGH (ref 70–99)
Potassium: 3.8 mmol/L (ref 3.5–5.1)
Sodium: 140 mmol/L (ref 135–145)

## 2019-06-11 LAB — GLUCOSE, CAPILLARY
Glucose-Capillary: 126 mg/dL — ABNORMAL HIGH (ref 70–99)
Glucose-Capillary: 216 mg/dL — ABNORMAL HIGH (ref 70–99)
Glucose-Capillary: 106 mg/dL — ABNORMAL HIGH (ref 70–99)
Glucose-Capillary: 125 mg/dL — ABNORMAL HIGH (ref 70–99)

## 2019-06-11 MED ORDER — ENOXAPARIN SODIUM 80 MG/0.8ML ~~LOC~~ SOLN
80.0000 mg | SUBCUTANEOUS | Status: DC
  Administered 2019-06-11 – 2019-06-12 (×2): 80 mg via SUBCUTANEOUS
  Filled 2019-06-11 (×2): qty 0.8

## 2019-06-11 MED ORDER — INSULIN ASPART 100 UNIT/ML ~~LOC~~ SOLN
12.0000 [IU] | Freq: Three times a day (TID) | SUBCUTANEOUS | Status: DC
  Administered 2019-06-12 (×2): 12 [IU] via SUBCUTANEOUS

## 2019-06-11 MED ORDER — INSULIN NPH (HUMAN) (ISOPHANE) 100 UNIT/ML ~~LOC~~ SUSP
20.0000 [IU] | Freq: Every day | SUBCUTANEOUS | Status: DC
  Administered 2019-06-11: 20 [IU] via SUBCUTANEOUS
  Filled 2019-06-11: qty 10

## 2019-06-11 MED ORDER — INSULIN NPH (HUMAN) (ISOPHANE) 100 UNIT/ML ~~LOC~~ SUSP
25.0000 [IU] | Freq: Every day | SUBCUTANEOUS | Status: DC
  Administered 2019-06-12 – 2019-06-13 (×2): 25 [IU] via SUBCUTANEOUS
  Filled 2019-06-11: qty 10

## 2019-06-11 NOTE — Progress Notes (Signed)
Progress Note  Patient Name: Daniel Valenzuela Date of Encounter: 06/11/2019  Primary Cardiologist: Lewayne Bunting, MD   Subjective   Doing well. Feels he is making good urine, still with some mild flow issues but feels that he fully empties. Eating well. Reviewed that his I/O and weights do not seem to match.  Inpatient Medications    Scheduled Meds:  atorvastatin  80 mg Oral QPM   buPROPion  300 mg Oral Daily   chlorhexidine  15 mL Mouth/Throat BID   Chlorhexidine Gluconate Cloth  6 each Topical Daily   enoxaparin (LOVENOX) injection  80 mg Subcutaneous Q24H   escitalopram  20 mg Oral QPM   furosemide  80 mg Intravenous TID   gabapentin  1,200 mg Oral QHS   gabapentin  600 mg Oral Q breakfast   insulin aspart  0-15 Units Subcutaneous TID WC   insulin aspart  0-5 Units Subcutaneous QHS   insulin aspart  12 Units Subcutaneous TID WC   insulin NPH Human  20 Units Subcutaneous QHS   [START ON 06/12/2019] insulin NPH Human  25 Units Subcutaneous QAC breakfast   lisinopril  10 mg Oral Daily   pantoprazole  40 mg Oral BID   potassium chloride  20 mEq Oral Daily   tamsulosin  0.4 mg Oral QPC supper   Continuous Infusions:  PRN Meds: HYDROcodone-acetaminophen   Vital Signs    Vitals:   06/10/19 1815 06/10/19 1939 06/11/19 0447 06/11/19 1147  BP: 109/74 119/81 (!) 122/56 120/84  Pulse: 86 85 87 98  Resp: Temp: 98.4 F (36.9 C) 97.8 F (36.6 C) 97.7 F (36.5 C) 98.2 F (36.8 C)  TempSrc: Oral Oral Oral Oral  SpO2: 97% 94% 97% 96%  Weight:   (!) 175.1 kg   Height:        Intake/Output Summary (Last 24 hours) at 06/11/2019 1429 Last data filed at 06/11/2019 1148 Gross per 24 hour  Intake 2380 ml  Output 1575 ml  Net 805 ml   Last 3 Weights 06/11/2019 06/10/2019 06/09/2019  Weight (lbs) 386 lb 389 lb 8 oz 387 lb 11.2 oz  Weight (kg) 175.088 kg 176.676 kg 175.86 kg      Telemetry    Sr, 1st degree AV block (very long PR) -  Personally Reviewed  ECG    No new - Personally Reviewed  Physical Exam   GEN: Well nourished, well developed in no acute distress HEENT: Normal, moist mucous membranes NECK: No JVD visible CARDIAC: regular rhythm, normal S1 and S2, no rubs or gallops. No murmurs. VASCULAR: Radial and DP pulses 2+ bilaterally. No carotid bruits RESPIRATORY:  Clear to auscultation though distant ABDOMEN: Soft, non-tender, non-distended MUSCULOSKELETAL:  Ambulates independently SKIN: Warm and dry, trace edema NEUROLOGIC:  Alert and oriented x 3. No focal neuro deficits noted. PSYCHIATRIC:  Normal affect   Labs    High Sensitivity Troponin:   Recent Labs  Lab 06/04/19 0943 06/04/19 1331  TROPONINIHS 109* 110*      Chemistry Recent Labs  Lab 06/05/19 0410 06/06/19 0418 06/07/19 0450  06/09/19 0337 06/10/19 0429 06/11/19 0458  NA 141 141 142   < > 141 141 140  K 4.3 3.5 3.9   < > 3.5 3.4* 3.8  CL 99 100 99   < > 100 99 97*  CO2 29 33* 32   < > GLUCOSE 199* 139* 126*   < > 175* 168* 133*  BUN 11 13 18    < > 19 22 19   CREATININE 0.92 0.96 1.03   < > 1.00 1.03 0.97  CALCIUM 8.9 8.7* 8.9   < > 8.3* 8.4* 8.7*  PROT 6.6 6.2* 6.0*  --   --   --   --   ALBUMIN 3.0* 2.8* 3.1*  --   --   --   --   AST 24 29 26   --   --   --   --   ALT 19 18 16   --   --   --   --   ALKPHOS 96 92 78  --   --   --   --   BILITOT 1.1 1.2 1.0  --   --   --   --   GFRNONAA >60 >60 >60   < > >60 >60 >60  GFRAA >60 >60 >60   < > >60 >60 >60  ANIONGAP 13 8 11    < > 12 12 12    < > = values in this interval not displayed.     Hematology Recent Labs  Lab 06/06/19 0418 06/07/19 0450 06/10/19 0429  WBC 9.5 10.8* 9.0  RBC 4.47 4.25 3.85*  HGB 13.0 12.3* 11.2*  HCT 40.3 38.4* 35.5*  MCV 90.2 90.4 92.2  MCH 29.1 28.9 29.1  MCHC 32.3 32.0 31.5  RDW 15.2 15.4 15.5  PLT 267 246 235    BNP No results for input(s): BNP, PROBNP in the last 168 hours.   DDimer No results for input(s): DDIMER in the  last 168 hours.   Radiology    Ct 3d Recon At Scanner  Result Date: 06/09/2019 CLINICAL DATA:  Dental disease EXAM: CT MAXILLOFACIAL WITHOUT CONTRAST; 3-DIMENSIONAL CT IMAGE RENDERING ON ACQUISITION WORKSTATION TECHNIQUE: Multidetector CT imaging of the maxillofacial structures was performed. Multiplanar CT image reconstructions were also generated. Three-dimensional volume rendering was performed on the acquisition workstation for better evaluation the mandible and teeth. COMPARISON:  None FINDINGS: Osseous: Streak artifact from dental amalgam. The left middle maxillary and mandibular molars are absent. There is a single right mandibular molar. No periapical lucencies. No sclerotic or destructive osseous lesion. Temporomandibular joints are unremarkable. Mastoid air cells are clear. Orbits: Unremarkable. Sinuses: Minor paranasal sinus mucosal thickening. Soft tissues: Negative. Limited intracranial: No significant or unexpected finding. IMPRESSION: No significant dental disease identified within limitation of streak artifact. Electronically Signed   By: Guadlupe SpanishPraneil  Patel M.D.   On: 06/09/2019 15:05   Ct Coronary Morph W/cta Cor W/score W/ca W/cm &/or Wo/cm  Addendum Date: 06/09/2019   ADDENDUM REPORT: 06/09/2019 16:32 EXAM: OVER-READ INTERPRETATION  CT CHEST The following report is an over-read performed by radiologist Dr. Royal Piedraaniel Entrikinof [facility] on 06/09/2019. This over-read does not include interpretation of cardiac or coronary anatomy or pathology. The cardiac CTA interpretation by the cardiologist is attached. COMPARISON:  None. FINDINGS: Extracardiac findings were described separately under dictation for contemporaneously obtained CTA chest, abdomen and pelvis. IMPRESSION: Please see separate dictation for contemporaneously obtained CTA chest, abdomen and pelvis 06/09/2019 for full description of relevant extracardiac findings. Electronically Signed   By: Trudie Reedaniel  Entrikin M.D.   On:  06/09/2019 16:32   Result Date: 06/09/2019 CLINICAL DATA:  68 year old male with severe aortic stenosis being evaluated for a TAVR procedure. EXAM: Cardiac TAVR CT TECHNIQUE: The patient was scanned on a [employer] scanner. A 120 kV retrospective scan was triggered in the descending thoracic aorta at 111 HU's. Gantry rotation speed was  250 msecs and collimation was .6 mm. No beta blockade or nitro were given. The 3D data set was reconstructed in 5% intervals of the R-R cycle. Systolic and diastolic phases were analyzed on a dedicated work station using MPR, MIP and VRT modes. The patient received 80 cc of contrast. FINDINGS: Aortic Valve: Trileaflet, with severely calcified and thickened leaflets and severely restricted leaflet opening. Aorta: Normal size with minimal atherosclerotic plaque and calcifications. Sinotubular Junction: 32 x 31 mm Ascending Thoracic Aorta: 38 x 37 mm Aortic Arch: 32 x 31 mm Descending Thoracic Aorta: 28 x 26 mm Sinus of Valsalva Measurements: Non-coronary: 35 mm Right -coronary: 34 mm Left -coronary: 35 mm Coronary Artery Height above Annulus: Left Main: 15 mm Right Coronary: 17 mm Virtual Basal Annulus Measurements: Maximum/Minimum Diameter: 31.5 x 28.4 mm Mean Diameter: 29.8 mm Perimeter: 94.5 mm Area: 696 mm2 Optimum Fluoroscopic Angle for Delivery: RAO 8 CRA 8. IMPRESSION: 1. Trileaflet, with severely calcified and thickened leaflets and severely restricted leaflet opening. Aortic valve calcium score is 3437 consistent with severe aortic stenosis. Patient's scan quality is affected by patient's size, annular size might be overestimated and appears too large for a 29 mm Edwards-SAPIEN 3 valve. Given patient's size, this patient might benefit from a 34 mm Evolut R CoreValve. 2. Sufficient coronary to annulus distance. 3. Optimum Fluoroscopic Angle for Delivery: RAO 8 CRA 8. 4. No thrombus in the left atrial appendage. 5. Mildly dilated pulmonary artery measuring 33 mm.  Electronically Signed: By: Tobias Alexander On: 06/09/2019 15:19   Ct Angio Chest Aorta W/cm &/or Wo/cm  Result Date: 06/09/2019 CLINICAL DATA:  68 year old male with history of severe aortic valve stenosis. Preprocedural study prior to potential transcatheter aortic valve replacement (TAVR) procedure. EXAM: CT ANGIOGRAPHY CHEST, ABDOMEN AND PELVIS TECHNIQUE: Multidetector CT imaging through the chest, abdomen and pelvis was performed using the standard protocol during bolus administration of intravenous contrast. Multiplanar reconstructed images and MIPs were obtained and reviewed to evaluate the vascular anatomy. CONTRAST:  OMNIPAQUE IOHEXOL 350 MG/ML SOLN COMPARISON:  No priors. FINDINGS: CTA CHEST FINDINGS Cardiovascular: Heart size is enlarged. There is no significant pericardial fluid, thickening or pericardial calcification. There is aortic atherosclerosis, as well as atherosclerosis of the great vessels of the mediastinum and the coronary arteries, including calcified atherosclerotic plaque in the left main, left anterior descending, left circumflex and right coronary arteries. Severe thickening calcification of the aortic valve. Moderate calcifications of the mitral annulus. Mediastinum/Lymph Nodes: Multiple prominent borderline enlarged and mildly enlarged mediastinal lymph nodes, largest of which measures 1.5 cm in short axis in the right paratracheal nodal station. No hilar lymphadenopathy. Patulous distal esophagus. No axillary lymphadenopathy. Lungs/Pleura: Patchy multifocal areas of peribronchovascular ground-glass attenuation throughout the right lung with some peribronchovascular micronodularity, largest of which is in the right lower lobe measuring 8 x 5 mm (mean diameter of 6.5 mm). No other larger more suspicious appearing pulmonary nodules or masses are noted. No pleural effusions. Musculoskeletal/Soft Tissues: There are no aggressive appearing lytic or blastic lesions noted in the  visualized portions of the skeleton. CTA ABDOMEN AND PELVIS FINDINGS Hepatobiliary: No suspicious cystic or solid hepatic lesions. No intra or extrahepatic biliary ductal dilatation. Gallbladder is normal in appearance. Pancreas: No pancreatic mass. No pancreatic ductal dilatation. No pancreatic or peripancreatic fluid collections or inflammatory changes. Spleen: Unremarkable. Adrenals/Urinary Tract: 2.1 cm intermediate attenuation (38 HU) lesion (axial image 136 of series 15) in the posterior aspect of the interpolar region of the right kidney, incompletely  characterized. Left kidney and bilateral adrenal glands are normal in appearance. No hydroureteronephrosis. Urinary bladder is nearly decompressed, with a Foley balloon catheter in place, and small amount of gas non dependently in the lumen of the urinary bladder which is presumably iatrogenic. Stomach/Bowel: LapBand in place around the proximal stomach. Stomach is otherwise unremarkable in appearance. No pathologic dilatation of small bowel or colon. Normal appendix. Vascular/Lymphatic: Mild aortic atherosclerosis, without evidence of aneurysm or dissection in the abdominal or pelvic vasculature. Vascular findings and measurements pertinent to potential TAVR procedure, as detailed below. No lymphadenopathy noted in the abdomen or pelvis. Reproductive: Prostate gland and seminal vesicles are unremarkable in appearance. Other: No significant volume of ascites.  No pneumoperitoneum. Musculoskeletal: There are no aggressive appearing lytic or blastic lesions noted in the visualized portions of the skeleton. VASCULAR MEASUREMENTS PERTINENT TO TAVR: AORTA: Minimal Aortic Diameter-18 x 18 mm Severity of Aortic Calcification-mild RIGHT PELVIS: Right Common Iliac Artery - Minimal Diameter-13.1 x 11.5 mm Tortuosity-mild Calcification-minimal Right External Iliac Artery - Minimal Diameter-8.4 x 7.9 mm Tortuosity-moderate to severe Calcification-none Right Common Femoral  Artery - Minimal Diameter-10.0 x 9.2 mm Tortuosity-mild Calcification - none LEFT PELVIS: Left Common Iliac Artery - Minimal Diameter-13.4 x 11.6 mm Tortuosity-mild Calcification-minimal Left External Iliac Artery - Minimal Diameter-8.3 x 8.6 mm Tortuosity-moderate Calcification - none Left Common Femoral Artery - Minimal Diameter-7.8 x 7.7 mm Tortuosity-mild Calcification - none Review of the MIP images confirms the above findings. IMPRESSION: 1. Vascular findings and measurements pertinent to potential TAVR procedure, as detailed above. 2. Severe thickening calcification of the aortic valve, compatible with the reported clinical history of severe aortic stenosis. 3. Aortic atherosclerosis, in addition to left main and 3 vessel coronary artery disease. 4. Cardiomegaly. 5. Multiple borderline enlarged and mildly enlarged mediastinal lymph nodes measuring up to 1.5 cm in the right paratracheal nodal station. This is nonspecific, but favored to be reactive given the findings in the right lung which likely reflect mild multilobar bronchopneumonia. 6. Multiple small pulmonary nodules in the right lung, largest of which has a mean diameter of 6.5 mm in the right lower lobe. Non-contrast chest CT at 3-6 months is recommended. If the nodules are stable at time of repeat CT, then future CT at 18-24 months (from today's scan) is considered optional for low-risk patients, but is recommended for high-risk patients. This recommendation follows the consensus statement: Guidelines for Management of Incidental Pulmonary Nodules Detected on CT Images: From the Fleischner Society 2017; Radiology 2017; 284:228-243. 7. 2.1 cm intermediate attenuation lesion in the right kidney, incompletely characterized on today's examination. The possibility of a small renal neoplasm is not excluded, and further characterization with nonemergent abdominal MRI with and without IV gadolinium is recommended in the near future. Electronically Signed    By: Trudie Reed M.D.   On: 06/09/2019 15:35   Ct Maxillofacial Wo Contrast  Result Date: 06/09/2019 CLINICAL DATA:  Dental disease EXAM: CT MAXILLOFACIAL WITHOUT CONTRAST; 3-DIMENSIONAL CT IMAGE RENDERING ON ACQUISITION WORKSTATION TECHNIQUE: Multidetector CT imaging of the maxillofacial structures was performed. Multiplanar CT image reconstructions were also generated. Three-dimensional volume rendering was performed on the acquisition workstation for better evaluation the mandible and teeth. COMPARISON:  None FINDINGS: Osseous: Streak artifact from dental amalgam. The left middle maxillary and mandibular molars are absent. There is a single right mandibular molar. No periapical lucencies. No sclerotic or destructive osseous lesion. Temporomandibular joints are unremarkable. Mastoid air cells are clear. Orbits: Unremarkable. Sinuses: Minor paranasal sinus mucosal thickening. Soft tissues:  Negative. Limited intracranial: No significant or unexpected finding. IMPRESSION: No significant dental disease identified within limitation of streak artifact. Electronically Signed   By: Macy Mis M.D.   On: 06/09/2019 15:05   Ct Angio Abd/pel W/ And/or W/o  Result Date: 06/09/2019 CLINICAL DATA:  68 year old male with history of severe aortic valve stenosis. Preprocedural study prior to potential transcatheter aortic valve replacement (TAVR) procedure. EXAM: CT ANGIOGRAPHY CHEST, ABDOMEN AND PELVIS TECHNIQUE: Multidetector CT imaging through the chest, abdomen and pelvis was performed using the standard protocol during bolus administration of intravenous contrast. Multiplanar reconstructed images and MIPs were obtained and reviewed to evaluate the vascular anatomy. CONTRAST:  182mL OMNIPAQUE IOHEXOL 350 MG/ML SOLN COMPARISON:  No priors. FINDINGS: CTA CHEST FINDINGS Cardiovascular: Heart size is enlarged. There is no significant pericardial fluid, thickening or pericardial calcification. There is aortic  atherosclerosis, as well as atherosclerosis of the great vessels of the mediastinum and the coronary arteries, including calcified atherosclerotic plaque in the left main, left anterior descending, left circumflex and right coronary arteries. Severe thickening calcification of the aortic valve. Moderate calcifications of the mitral annulus. Mediastinum/Lymph Nodes: Multiple prominent borderline enlarged and mildly enlarged mediastinal lymph nodes, largest of which measures 1.5 cm in short axis in the right paratracheal nodal station. No hilar lymphadenopathy. Patulous distal esophagus. No axillary lymphadenopathy. Lungs/Pleura: Patchy multifocal areas of peribronchovascular ground-glass attenuation throughout the right lung with some peribronchovascular micronodularity, largest of which is in the right lower lobe measuring 8 x 5 mm (mean diameter of 6.5 mm). No other larger more suspicious appearing pulmonary nodules or masses are noted. No pleural effusions. Musculoskeletal/Soft Tissues: There are no aggressive appearing lytic or blastic lesions noted in the visualized portions of the skeleton. CTA ABDOMEN AND PELVIS FINDINGS Hepatobiliary: No suspicious cystic or solid hepatic lesions. No intra or extrahepatic biliary ductal dilatation. Gallbladder is normal in appearance. Pancreas: No pancreatic mass. No pancreatic ductal dilatation. No pancreatic or peripancreatic fluid collections or inflammatory changes. Spleen: Unremarkable. Adrenals/Urinary Tract: 2.1 cm intermediate attenuation (38 HU) lesion (axial image 136 of series 15) in the posterior aspect of the interpolar region of the right kidney, incompletely characterized. Left kidney and bilateral adrenal glands are normal in appearance. No hydroureteronephrosis. Urinary bladder is nearly decompressed, with a Foley balloon catheter in place, and small amount of gas non dependently in the lumen of the urinary bladder which is presumably iatrogenic.  Stomach/Bowel: LapBand in place around the proximal stomach. Stomach is otherwise unremarkable in appearance. No pathologic dilatation of small bowel or colon. Normal appendix. Vascular/Lymphatic: Mild aortic atherosclerosis, without evidence of aneurysm or dissection in the abdominal or pelvic vasculature. Vascular findings and measurements pertinent to potential TAVR procedure, as detailed below. No lymphadenopathy noted in the abdomen or pelvis. Reproductive: Prostate gland and seminal vesicles are unremarkable in appearance. Other: No significant volume of ascites.  No pneumoperitoneum. Musculoskeletal: There are no aggressive appearing lytic or blastic lesions noted in the visualized portions of the skeleton. VASCULAR MEASUREMENTS PERTINENT TO TAVR: AORTA: Minimal Aortic Diameter-18 x 18 mm Severity of Aortic Calcification-mild RIGHT PELVIS: Right Common Iliac Artery - Minimal Diameter-13.1 x 11.5 mm Tortuosity-mild Calcification-minimal Right External Iliac Artery - Minimal Diameter-8.4 x 7.9 mm Tortuosity-moderate to severe Calcification-none Right Common Femoral Artery - Minimal Diameter-10.0 x 9.2 mm Tortuosity-mild Calcification - none LEFT PELVIS: Left Common Iliac Artery - Minimal Diameter-13.4 x 11.6 mm Tortuosity-mild Calcification-minimal Left External Iliac Artery - Minimal Diameter-8.3 x 8.6 mm Tortuosity-moderate Calcification - none Left Common Femoral Artery -  Minimal Diameter-7.8 x 7.7 mm Tortuosity-mild Calcification - none Review of the MIP images confirms the above findings. IMPRESSION: 1. Vascular findings and measurements pertinent to potential TAVR procedure, as detailed above. 2. Severe thickening calcification of the aortic valve, compatible with the reported clinical history of severe aortic stenosis. 3. Aortic atherosclerosis, in addition to left main and 3 vessel coronary artery disease. 4. Cardiomegaly. 5. Multiple borderline enlarged and mildly enlarged mediastinal lymph nodes  measuring up to 1.5 cm in the right paratracheal nodal station. This is nonspecific, but favored to be reactive given the findings in the right lung which likely reflect mild multilobar bronchopneumonia. 6. Multiple small pulmonary nodules in the right lung, largest of which has a mean diameter of 6.5 mm in the right lower lobe. Non-contrast chest CT at 3-6 months is recommended. If the nodules are stable at time of repeat CT, then future CT at 18-24 months (from today's scan) is considered optional for low-risk patients, but is recommended for high-risk patients. This recommendation follows the consensus statement: Guidelines for Management of Incidental Pulmonary Nodules Detected on CT Images: From the Fleischner Society 2017; Radiology 2017; 284:228-243. 7. 2.1 cm intermediate attenuation lesion in the right kidney, incompletely characterized on today's examination. The possibility of a small renal neoplasm is not excluded, and further characterization with nonemergent abdominal MRI with and without IV gadolinium is recommended in the near future. Electronically Signed   By: Trudie Reed M.D.   On: 06/09/2019 15:35    Cardiac Studies   Echo 06/05/19 1. Left ventricular ejection fraction, by visual estimation, is 30 to 35%. The left ventricle has moderate to severely decreased function. There is no left ventricular hypertrophy. 2. Definity contrast agent was given IV to delineate the left ventricular endocardial borders. 3. Left ventricular diastolic parameters are indeterminate. 4. Moderately dilated left ventricular internal cavity size. 5. Diffuse hypokinesis worse in the apex and septum. 6. Global right ventricle has normal systolic function.The right ventricular size is normal. No increase in right ventricular wall thickness. 7. Left atrial size was moderately dilated. 8. Right atrial size was not well visualized. 9. Moderate mitral annular calcification. 10. Severe calcification of  the mitral valve leaflet(s). 11. Severe thickening of the mitral valve leaflet(s). 12. The mitral valve is degenerative. Trace mitral valve regurgitation. 13. The tricuspid valve is normal in structure. Tricuspid valve regurgitation is mild. 14. The aortic valve is tricuspid. Aortic valve regurgitation is not visualized. Severe aortic valve stenosis. 15. Severely calcified with restricted leaflet motion Likely low flow severe AS DVI 0.20 mean gradient increased from 28 mmHg to 35 mmHg peak increased from 47 to 51 mmHg since January. 16. The pulmonic valve was grossly normal. Pulmonic valve regurgitation is mild. 17. The interatrial septum was not well visualized.    Patient Profile     68 y.o. male with a hx of chronic systolic and diastolic HF and aortic stenosis, non obstructive disease by cath 07/2018, hx of syncope, OSA, DM now admitted with acute on chronic systolic and diastolic HF.  Assessment & Plan    Acute on chronic systolic and diastolic heart failure - self reported weight gain, about 40 lbs since July - echo this admit LVEF 30-35%, severe AS. In Jan 2020 LVEF 35-40%, mod AS Jan 2020 cath mild to mod CAD - I/O and weights difficult. Appears less negative vs yesterday but weight has gone done. May need to go by weights. Wt today 175.1kg, peak 180.2 kg -he is on room air,  not short of breath. I would like him to ambulate more while in his room. I would try one more day of IV lasix. If he does not have significant urine output, then I would wonder if we are actually approaching euvolemia. His volume status is very difficult to assess. If he can ambulate and not drop his O2 sats, he may be able to be discharged. Unclear how much weight gain at baseline is due to fluid vs. Increased caloric intake/decreased exercise -has been complicated by urinary retention, started flomax this admission. Monitor for gynecomastia given prior side effect with methyldopa. -fluid restriction in  place. -no metoprolol given conduction disease and prior syncope -tolerating lisinopril. If renal function remains stable, could consider entresto with lisinopril washout  Aortic stenosis, now severe: -appreciate evaluation of structural heart team -ct tavr done 11/11  Conduction disease, with very long PR: suspect he may need pacemaker with TAVR  Per primary team: OSA with CPAP  GERD Urinary retention: as above Type II diabetes on insulin  For questions or updates, please contact [facility] Please consult [email or web address] for contact info under     Signed, Jodelle RedBridgette Serafina Topham, MD  06/11/2019, 2:29 PM

## 2019-06-11 NOTE — Progress Notes (Addendum)
Inpatient Diabetes Program Recommendations  AACE/ADA: New Consensus Statement on Inpatient Glycemic Control (2015)  Target Ranges:  Prepandial:   less than 140 mg/dL      Peak postprandial:   less than 180 mg/dL (1-2 hours)      Critically ill patients:  140 - 180 mg/dL   Lab Results  Component Value Date   GLUCAP 216 (H) 06/11/2019   HGBA1C 8.4 (H) 06/04/2019    Review of Glycemic Control Results for Daniel Valenzuela, BEAGLEY (MRN [id number]) as of 06/11/2019 13:31  Ref. Range 06/10/2019 21:18 06/10/2019 21:54 06/11/2019 05:43 06/11/2019 11:45  Glucose-Capillary Latest Ref Range: 70 - 99 mg/dL 66 (L) 94 126 (H) 216 (H)   Diabetes history: DM 2 Outpatient Diabetes medications:  NPH 40 units tid with meals, Novolin R- 20 units tid with meals Current orders for Inpatient glycemic control:  NPH 30 units bid, Novolog 15 units tid with meals, Novolog moderate tid with meals and HS  Inpatient Diabetes Program Recommendations:  May consider reducing AM dose of NPH to 25 units and reduce PM dose of NPH to 20 units.  Of note patient did not receive NPH last PM due to low blood sugar.  Also may need slight reduction in Novolog to 12 units tid with meals.   Thanks,  Adah Perl, RN, BC-ADM Inpatient Diabetes Coordinator Pager [phone] (8a-5p)

## 2019-06-11 NOTE — Progress Notes (Signed)
Pt refuses CPAP for tonight.   

## 2019-06-11 NOTE — Plan of Care (Signed)
  Problem: Education: Goal: Knowledge of General Education information will improve Description Including pain rating scale, medication(s)/side effects and non-pharmacologic comfort measures Outcome: Progressing   

## 2019-06-11 NOTE — Progress Notes (Signed)
Patient ID: Daniel Valenzuela, male   DOB: 1951/04/11, 68 y.o.   MRN: [id number]  PROGRESS NOTE    Daniel Valenzuela  VEH:[id number] DOB: June 23, 1951 DOA: 06/04/2019 PCP: Asencion Noble, MD   Brief Narrative:  68 year old male with history of insulin-dependent diabetes mellitus type 2, heart failure with reduced ejection fraction (last echocardiogram on January 2020 showed ejection fraction of 35 to 40%, hypertension, hyperlipidemia admitted with acute shortness of breath worsening bilateral and abdominal edema.  Patient endorsed about 40 pounds weight gain.  He admitted to frequent consumption of coffee and beverages, mostly unsweetened tea which has been ongoing for the past several months.  He takes Lasix p.o. 40 mg daily which appears not to be effective as a highly makes much urine after taking Lasix. In the ED, he has elevated BNP, troponin of 110 with a stable creatinine of 0.79. Chest x-ray showed cardiomegaly with bilateral pulmonary interstitial prominence consistent with fluid overload. Echocardiogram done on 06/05/2019 showed estimated ejection fraction of 30 to 35%, when compared with echo of January 2020 showed some reduction in ejection fraction.  There is moderate to severe decreased left ventricular function.  There is severe aortic stenosis with aortic valve area less than 1 cm. Will continue with guideline directed medical therapy and consult cardiology for evaluation.  Patient getting doses of IV Lasix  Assessment & Plan:   Principal Problem:   Acute combined systolic and diastolic CHF Active Problems:   Diabetic neuropathy (HCC)   High cholesterol   Shortness of breath   Type 2 diabetes mellitus with complication, with long-term current use of insulin (HCC)   Morbid obesity with BMI of 45.0-49.9, adult (HCC)   CAD (coronary artery disease)   CHF exacerbation ([place])  Clinical problems list 1.  Acute exacerbation of heart failure with reduced ejection fraction 2.  Diabetes mellitus  type 2, with long-term insulin use/diabetic neuropathy 3.  Hyperlipidemia 4.  Essential hypertension 5.  Elevated troponin 6.  Morbid obesity BMI 54.9 7.  Severe aortic stenosis  1.  Acute exacerbation of heart failure with reduced ejection fraction: Patient presenting with worsening shortness of breath, bilateral pitting pedal edema, abdominal fullness and 40 pound weight gain.  Admitted to increased fluid consumption especially coffee with unsweetened tea. Takes Lasix 40 mg daily at home which appears not to have been effective.  Echocardiogram done in January 2020 showed ejection fraction of 35 to 40% by estimation.  Repeat echocardiogram today was significant for ejection fraction of 30 to 35% with moderate to severe decrease left ventricular function and severe aortic stenosis with aortic valve area less than 1 cm. -IV Lasix 80 mg 3 times daily -Hold off on beta-blocker due to conduction disease and decompensated state of congestive heart failure.  ACE inhibitor has been restarted but will monitor closely given the patient's severe aortic stenosis -2 g sodium diet -Fluid restriction to 1500 cc per 24 hours -Daily weight -Cardiology on board.  2.  Diabetes mellitus type 2, with long-term insulin use/diabetic neuropathy.  Hemoglobin A1c 8.4 -Continue with sliding scale insulin -Continue long-acting insulin -Mealtime insulin 3 times daily -Fingersticks before meals and at bedtime -Hypoglycemic protocol -Continue with gabapentin  3.  Hyperlipidemia Continue with atorvastatin 80 mg daily  4.  Essential hypertension.  Blood pressure currently running low without compensatory/reflex tachycardia concerning for possible cardiac ischemia.  5.  Elevated troponin.  High-sensitivity troponin elevated at 110. EKG showed AV conduction delay with T wave inversion in leads V1.  A  possible septal infarct.  When compared with EKG of August 10, 2018, appears not to be significant.  6.  Morbid  obesity BMI 54.91 Nutritional referral  7.  Severe aortic stenosis -Structural heart team consulted; TAVR work-up has been completed  8. Urinary retention: foley recently removed but urine output decreased so is likely retaining again -tamsulosin started this admission; monitor post-void residual which is difficult to assess given body habitus   DVT prophylaxis: Lovenox subcute  Code Status: Full  Family Communication: Discussed with patient  Disposition Plan: Pending clinical improvement    Consultants:   Cardiology  Procedures: 2D echocardiogram 06/05/2019: Ejection fraction 30 to 35% with severely reduced left ventricular function.   Antimicrobials: None    Subjective: Patient denies any shortness of breath or chest tightness.  He states his leg swelling is improved and he is able to ambulate more with physical therapy with each passing day.  He does note that this morning he was able to get out more urine on his own than he was yesterday.  Objective: Vitals:   06/10/19 1815 06/10/19 1939 06/11/19 0447 06/11/19 1147  BP: 109/74 119/81 (!) 122/56 120/84  Pulse: 86 85 87 98  Resp: 20 18 20 18   Temp: 98.4 F (36.9 C) 97.8 F (36.6 C) 97.7 F (36.5 C) 98.2 F (36.8 C)  TempSrc: Oral Oral Oral Oral  SpO2: 97% 94% 97% 96%  Weight:   (!) 175.1 kg   Height:        Intake/Output Summary (Last 24 hours) at 06/11/2019 1451 Last data filed at 06/11/2019 1148 Gross per 24 hour  Intake 2380 ml  Output 1575 ml  Net 805 ml   Filed Weights   06/09/19 0035 06/10/19 0501 06/11/19 0447  Weight: (!) 175.9 kg (!) 176.7 kg (!) 175.1 kg    Examination:  General exam: Appears calm and comfortable  Respiratory system: Distant lung sounds.  Respiratory effort normal. Cardiovascular system: S1 & S2 heard, RRR. +JVD, no murmurs, rubs, gallops or clicks.  Distant heart sounds due to morbid obesity. Gastrointestinal system: Abdomen is obese soft and nontender.  Unable to palpate  any organ due to abdominal distention.   Central nervous system: Alert and oriented. No focal neurological deficits. Extremities: Symmetric 5 x 5 power.  Trace pitting pedal edema up to mid shin Skin: Multiple ecchymotic areas of lower extremity  Psychiatry: Judgement and insight appear normal. Mood & affect appropriate.    LOS: 7 days    Time spent: 25 minutes    06/13/19, MD [facility]  If 7PM-7AM, please contact night-coverage [email or web address] Password [email or web address] 06/11/2019, 2:51 PM

## 2019-06-12 ENCOUNTER — Inpatient Hospital Stay (HOSPITAL_COMMUNITY): Payer: Medicare Other

## 2019-06-12 DIAGNOSIS — I2510 Atherosclerotic heart disease of native coronary artery without angina pectoris: Secondary | ICD-10-CM

## 2019-06-12 LAB — URINALYSIS, ROUTINE W REFLEX MICROSCOPIC
Bilirubin Urine: NEGATIVE
Glucose, UA: NEGATIVE mg/dL
Ketones, ur: NEGATIVE mg/dL
Nitrite: POSITIVE — AB
Protein, ur: 100 mg/dL — AB
Specific Gravity, Urine: 1.017 (ref 1.005–1.030)
WBC, UA: >50 WBC/hpf — ABNORMAL HIGH (ref 0–5)
pH: 7 (ref 5.0–8.0)

## 2019-06-12 LAB — BLOOD GAS, ARTERIAL
Acid-Base Excess: 6.1 mmol/L — ABNORMAL HIGH (ref 0.0–2.0)
Bicarbonate: 30.3 mmol/L — ABNORMAL HIGH (ref 20.0–28.0)
FIO2: 21
O2 Saturation: 93.2 %
Patient temperature: 36.5
pCO2 arterial: 46.5 mmHg (ref 32.0–48.0)
pH, Arterial: 7.431 (ref 7.350–7.450)
pO2, Arterial: 70.8 mmHg — ABNORMAL LOW (ref 83.0–108.0)

## 2019-06-12 LAB — BASIC METABOLIC PANEL
Anion gap: 14 (ref 5–15)
BUN: 22 mg/dL (ref 8–23)
CO2: 30 mmol/L (ref 22–32)
Calcium: 8.8 mg/dL — ABNORMAL LOW (ref 8.9–10.3)
Chloride: 94 mmol/L — ABNORMAL LOW (ref 98–111)
Creatinine, Ser: 1.13 mg/dL (ref 0.61–1.24)
GFR calc Af Amer: >60 mL/min (ref >60)
GFR calc non Af Amer: >60 mL/min (ref >60)
Glucose, Bld: 163 mg/dL — ABNORMAL HIGH (ref 70–99)
Potassium: 3.9 mmol/L (ref 3.5–5.1)
Sodium: 138 mmol/L (ref 135–145)

## 2019-06-12 LAB — CBC WITH DIFFERENTIAL/PLATELET
Abs Immature Granulocytes: 0.03 10*3/uL (ref 0.00–0.07)
Basophils Absolute: 0 10*3/uL (ref 0.0–0.1)
Basophils Relative: 0 %
Eosinophils Absolute: 0.1 10*3/uL (ref 0.0–0.5)
Eosinophils Relative: 1 %
HCT: 39.3 % (ref 39.0–52.0)
Hemoglobin: 12.5 g/dL — ABNORMAL LOW (ref 13.0–17.0)
Immature Granulocytes: 0 %
Lymphocytes Relative: 13 %
Lymphs Abs: 1.6 10*3/uL (ref 0.7–4.0)
MCH: 29.2 pg (ref 26.0–34.0)
MCHC: 31.8 g/dL (ref 30.0–36.0)
MCV: 91.8 fL (ref 80.0–100.0)
Monocytes Absolute: 0.9 10*3/uL (ref 0.1–1.0)
Monocytes Relative: 8 %
Neutro Abs: 9.2 10*3/uL — ABNORMAL HIGH (ref 1.7–7.7)
Neutrophils Relative %: 78 %
Platelets: 273 10*3/uL (ref 150–400)
RBC: 4.28 MIL/uL (ref 4.22–5.81)
RDW: 15.7 % — ABNORMAL HIGH (ref 11.5–15.5)
WBC: 11.9 10*3/uL — ABNORMAL HIGH (ref 4.0–10.5)
nRBC: 0 % (ref 0.0–0.2)

## 2019-06-12 LAB — GLUCOSE, CAPILLARY
Glucose-Capillary: 157 mg/dL — ABNORMAL HIGH (ref 70–99)
Glucose-Capillary: 228 mg/dL — ABNORMAL HIGH (ref 70–99)
Glucose-Capillary: 68 mg/dL — ABNORMAL LOW (ref 70–99)
Glucose-Capillary: 89 mg/dL (ref 70–99)
Glucose-Capillary: 109 mg/dL — ABNORMAL HIGH (ref 70–99)

## 2019-06-12 MED ORDER — PHENAZOPYRIDINE HCL 100 MG PO TABS
100.0000 mg | ORAL_TABLET | Freq: Three times a day (TID) | ORAL | Status: DC
  Administered 2019-06-12 – 2019-06-13 (×3): 100 mg via ORAL
  Filled 2019-06-12 (×4): qty 1

## 2019-06-12 MED ORDER — FUROSEMIDE 80 MG PO TABS
80.0000 mg | ORAL_TABLET | Freq: Two times a day (BID) | ORAL | Status: DC
  Administered 2019-06-12 – 2019-06-13 (×2): 80 mg via ORAL
  Filled 2019-06-12 (×2): qty 1

## 2019-06-12 NOTE — Progress Notes (Addendum)
Progress Note  Patient Name: Daniel Valenzuela Date of Encounter: 06/12/2019  Primary Cardiologist: Lewayne Bunting, MD   Subjective   Doing well.  Denies any SOB or LE edema.  Put out 1.3L yesterday and is net neg 3.8L.  Inpatient Medications    Scheduled Meds: . atorvastatin  80 mg Oral QPM  . buPROPion  300 mg Oral Daily  . chlorhexidine  15 mL Mouth/Throat BID  . Chlorhexidine Gluconate Cloth  6 each Topical Daily  . enoxaparin (LOVENOX) injection  80 mg Subcutaneous Q24H  . escitalopram  20 mg Oral QPM  . furosemide  80 mg Intravenous TID  . gabapentin  1,200 mg Oral QHS  . gabapentin  600 mg Oral Q breakfast  . insulin aspart  0-15 Units Subcutaneous TID WC  . insulin aspart  0-5 Units Subcutaneous QHS  . insulin aspart  12 Units Subcutaneous TID WC  . insulin NPH Human  20 Units Subcutaneous QHS  . insulin NPH Human  25 Units Subcutaneous QAC breakfast  . lisinopril  10 mg Oral Daily  . pantoprazole  40 mg Oral BID  . potassium chloride  20 mEq Oral Daily  . tamsulosin  0.4 mg Oral QPC supper   Continuous Infusions:  PRN Meds: HYDROcodone-acetaminophen   Vital Signs    Vitals:   06/11/19 2016 06/12/19 0442 06/12/19 0500 06/12/19 0847  BP: 122/87   109/69  Pulse: 99  (!) 107 (!) 105  Resp: 18  18 18   Temp: 97.9 F (36.6 C)  98.2 F (36.8 C) 97.7 F (36.5 C)  TempSrc: Oral  Oral Oral  SpO2: 98%  97% 98%  Weight:  (!) 175.4 kg    Height:        Intake/Output Summary (Last 24 hours) at 06/12/2019 06/14/2019 Last data filed at 06/12/2019 0700 Gross per 24 hour  Intake 480 ml  Output 1325 ml  Net -845 ml   Last 3 Weights 06/12/2019 06/11/2019 06/10/2019  Weight (lbs) 386 lb 11.2 oz 386 lb 389 lb 8 oz  Weight (kg) 175.406 kg 175.088 kg 176.676 kg      Telemetry    NSR- Personally Reviewed  ECG    No new EKG to review - Personally Reviewed  Physical Exam   GEN: Well nourished, well developed in no acute distress HEENT: Normal NECK: No JVD; No  carotid bruits LYMPHATICS: No lymphadenopathy CARDIAC:RRR, no murmurs, rubs, gallops RESPIRATORY:  Clear to auscultation without rales, wheezing or rhonchi  ABDOMEN: Soft, non-tender, non-distended MUSCULOSKELETAL:  Trace bilateral edema; No deformity  SKIN: Warm and dry NEUROLOGIC:  Alert and oriented x 3 PSYCHIATRIC:  Normal affect    Labs    High Sensitivity Troponin:   Recent Labs  Lab 06/04/19 0943 06/04/19 1331  TROPONINIHS 109* 110*      Chemistry Recent Labs  Lab 06/06/19 0418 06/07/19 0450  06/10/19 0429 06/11/19 0458 06/12/19 0558  NA 141 142   < > 141 140 138  K 3.5 3.9   < > 3.4* 3.8 3.9  CL 100 99   < > 99 97* 94*  CO2 33* 32   < > 30 31 30   GLUCOSE 139* 126*   < > 168* 133* 163*  BUN 13 18   < > 22 19 22   CREATININE 0.96 1.03   < > 1.03 0.97 1.13  CALCIUM 8.7* 8.9   < > 8.4* 8.7* 8.8*  PROT 6.2* 6.0*  --   --   --   --  ALBUMIN 2.8* 3.1*  --   --   --   --   AST 29 26  --   --   --   --   ALT 18 16  --   --   --   --   ALKPHOS 92 78  --   --   --   --   BILITOT 1.2 1.0  --   --   --   --   GFRNONAA >60 >60   < > >60 >60 >60  GFRAA >60 >60   < > >60 >60 >60  ANIONGAP 8 11   < > 12 12 14    < > = values in this interval not displayed.     Hematology Recent Labs  Lab 06/06/19 0418 06/07/19 0450 06/10/19 0429  WBC 9.5 10.8* 9.0  RBC 4.47 4.25 3.85*  HGB 13.0 12.3* 11.2*  HCT 40.3 38.4* 35.5*  MCV 90.2 90.4 92.2  MCH 29.1 28.9 29.1  MCHC 32.3 32.0 31.5  RDW 15.2 15.4 15.5  PLT 267 246 235    BNP No results for input(s): BNP, PROBNP in the last 168 hours.   DDimer No results for input(s): DDIMER in the last 168 hours.   Radiology    No results found.  Cardiac Studies   Echo 06/05/19 1. Left ventricular ejection fraction, by visual estimation, is 30 to 35%. The left ventricle has moderate to severely decreased function. There is no left ventricular hypertrophy. 2. Definity contrast agent was given IV to delineate the left  ventricular endocardial borders. 3. Left ventricular diastolic parameters are indeterminate. 4. Moderately dilated left ventricular internal cavity size. 5. Diffuse hypokinesis worse in the apex and septum. 6. Global right ventricle has normal systolic function.The right ventricular size is normal. No increase in right ventricular wall thickness. 7. Left atrial size was moderately dilated. 8. Right atrial size was not well visualized. 9. Moderate mitral annular calcification. 10. Severe calcification of the mitral valve leaflet(s). 11. Severe thickening of the mitral valve leaflet(s). 12. The mitral valve is degenerative. Trace mitral valve regurgitation. 13. The tricuspid valve is normal in structure. Tricuspid valve regurgitation is mild. 14. The aortic valve is tricuspid. Aortic valve regurgitation is not visualized. Severe aortic valve stenosis. 15. Severely calcified with restricted leaflet motion Likely low flow severe AS DVI 0.20 mean gradient increased from 28 mmHg to 35 mmHg peak increased from 47 to 51 mmHg since January. 16. The pulmonic valve was grossly normal. Pulmonic valve regurgitation is mild. 17. The interatrial septum was not well visualized.    Patient Profile     68 y.o. male with a hx of chronic systolic and diastolic HF and aortic stenosis, non obstructive disease by cath 07/2018, hx of syncope, OSA, DM now admitted with acute on chronic systolic and diastolic HF.  Assessment & Plan    1.  Acute on chronic systolic and diastolic heart failure - self reported weight gain, about 40 lbs since July - echo this admit LVEF 30-35%, severe AS. In Jan 2020 LVEF 35-40%, mod AS Jan 2020 cath mild to mod CAD - he put out 1.3L yesterday and is net neg 3.8L - peak weight 180.2 kg and now down to 175kg - he is on room air, not short of breath.  - If he can ambulate and not drop his O2 sats, he may be able to be discharged tomorrow - Unclear how much weight gain at  baseline is due to fluid vs. Increased  caloric intake/decreased exercise - fluid restriction in place. - no metoprolol given conduction disease and prior syncope - tolerating lisinopril. If renal function remains stable, could consider entresto with lisinopril washout as outpt - change Lasix to PO 80mg  BID - follow renal function closely  2.  Aortic stenosis, now severe -appreciate evaluation of structural heart team -ct tavr done 11/11 -plan to followup with CVTS outpt for workup for TAVR  3. Conduction disease, with very long PR -suspect he may need pacemaker with TAVR  I have spent a total of 35 minutes with patient reviewing hospital notes, 2D echo , telemetry, EKGs, labs and examining patient as well as establishing an assessment and plan that was discussed with the patient.  > 50% of time was spent in direct patient care.     For questions or updates, please contact [facility] Please consult [email or web address] for contact info under     Signed, Armanda Magic, MD  06/12/2019, 9:27 AM

## 2019-06-12 NOTE — Progress Notes (Signed)
Pt has not voided since 1000. Received 80mg  Lasix PO at 1710. Pt had urge to go but was unsuccessful. Bladder scan shows 67ml but has previously been off by a large margin on this patient. Provider paged.   In/out ordered and completed.  Pt also falling asleep during conversation. Uses CPAP at home but has refused since admission. Provider ordered blood gas to assess CO2.

## 2019-06-12 NOTE — Progress Notes (Signed)
Patient's wife called. Stated she thinks patient seemed confused when talking to her on the phone. Stated he told her he could not remember if he received items he requested. Also called her at 0330 thinking  It was 1430. Wife also stated his daughter was concerned as she believes he has moments of confusion as well. He could not remember what he had for dinner. Informed her we will monitor the patient.

## 2019-06-12 NOTE — Progress Notes (Addendum)
RN doing last rounds, RN in front of the patient's room, and he was standing waving at me, verbalized "I'm okay" and then pt suddenly slid on the floor. Abrasion on the L knee, noted as the point of impact. No complains of pain at this time. MD Izetta Dakin notified. Ordered L knee X ray. Wife notified. Low bed ordered. Will endorse to the night RN accordingly

## 2019-06-12 NOTE — Progress Notes (Signed)
SATURATION QUALIFICATIONS: (This note is used to comply with regulatory documentation for home oxygen)  Patient Saturations on Room Air at Rest = 97%  Patient Saturations on Room Air while Ambulating = 92%  Patient Saturations on --- Liters of oxygen while Ambulating = ---%  Please briefly explain why patient needs home oxygen:   Pt does not need home O2. Pt tolerated well.

## 2019-06-12 NOTE — Progress Notes (Addendum)
Patient ID: Daniel Valenzuela, male   DOB: 1951-07-29, 68 y.o.   MRN: [id number]  PROGRESS NOTE    THERMAN HUGHLETT  YQI:[id number] DOB: [birth year] DOA: 06/04/2019 PCP: Asencion Noble, MD   Brief Narrative:  68 year old male with history of insulin-dependent diabetes mellitus type 2, heart failure with reduced ejection fraction (last echocardiogram on January 2020 showed ejection fraction of 35 to 40%, hypertension, hyperlipidemia admitted with acute shortness of breath worsening bilateral and abdominal edema.  Patient endorsed about 40 pounds weight gain.  He admitted to frequent consumption of coffee and beverages, mostly unsweetened tea which has been ongoing for the past several months.  He takes Lasix p.o. 40 mg daily which appears not to be effective as a highly makes much urine after taking Lasix. In the ED, he has elevated BNP, troponin of 110 with a stable creatinine of 0.79. Chest x-ray showed cardiomegaly with bilateral pulmonary interstitial prominence consistent with fluid overload. Echocardiogram done on 06/05/2019 showed estimated ejection fraction of 30 to 35%, when compared with echo of January 2020 showed some reduction in ejection fraction.  There is moderate to severe decreased left ventricular function.  There is severe aortic stenosis with aortic valve area less than 1 cm. Will continue with guideline directed medical therapy and consult cardiology for evaluation.  Patient getting doses of IV Lasix  Assessment & Plan:   Principal Problem:   Acute combined systolic and diastolic CHF Active Problems:   Diabetic neuropathy (HCC)   High cholesterol   Shortness of breath   Type 2 diabetes mellitus with complication, with long-term current use of insulin (HCC)   Morbid obesity with BMI of 45.0-49.9, adult (HCC)   CAD (coronary artery disease)   CHF exacerbation ([place])  Clinical problems list 1.  Acute exacerbation of heart failure with reduced ejection fraction 2.  Diabetes mellitus  type 2, with long-term insulin use/diabetic neuropathy 3.  Hyperlipidemia 4.  Essential hypertension 5.  Elevated troponin 6.  Morbid obesity BMI 54.9 7.  Severe aortic stenosis  1.  Acute exacerbation of heart failure with reduced ejection fraction:  - Much improved since admission  -  Patient presenting with worsening shortness of breath, bilateral pitting pedal edema, abdominal fullness and 40 pound weight gain.  Admitted to increased fluid consumption especially coffee with unsweetened tea. - Repeat TTE in this admission -- ejection fraction of 30 to 35% with moderate to severe decrease left ventricular function and severe aortic stenosis with aortic valve area less than 1 cm. -IV Lasix 80 mg 3 times now changed to 80mg  PO BID per Cardio  -Hold off on beta-blocker due to conduction disease and decompensated state of congestive heart failure.  ACE inhibitor has been restarted but will monitor closely given the patient's severe aortic stenosis -2 g sodium diet -Fluid restriction to 1500 cc per 24 hours -Daily weight - Pt was able to ambulate w/o supp O2 and maintained sat >92% this AM  -Cardiology on board; will follow their recs   2.  Diabetes mellitus type 2, with long-term insulin use/diabetic neuropathy.  Hemoglobin A1c 8.4 - BG stable  -Continue with sliding scale insulin -Continue long-acting insulin -Mealtime insulin 3 times daily -Fingersticks before meals and at bedtime -Hypoglycemic protocol -Continue with gabapentin  3.  Hyperlipidemia Continue with atorvastatin 80 mg daily  4.  Essential hypertension: No acute complaints or issues   Blood pressure currently running low without compensatory/reflex tachycardia concerning for possible cardiac ischemia - Cont to monitor  5.  Elevated troponin: no acute issues  - On admission,  High-sensitivity troponin elevated at 110. EKG showed AV conduction delay with T wave inversion in leads V1.  A possible septal infarct.  When  compared with EKG of August 10, 2018, appears not to be significant - Cardio on board - Cont to monitor   6.  Morbid obesity BMI 54.91 Nutritional referral - Counseled on diet   7.  Severe aortic stenosis -Structural heart team consulted; TAVR work-up has been completed - follow recs  - Monitor   8. Urinary retention: Improving  - foley recently removed but urine output decreased so is likely retaining again - tamsulosin started this admission - UOp maintained adequately - Monitor   9. Dysuria: Complained this AM - no fever  - May be from recent cath and discontinuation - Will check an UA  - May start Pyridium for irritation while awaiting UA   - Monitor S/S    DVT prophylaxis: Lovenox subcute  Code Status: Full  Family Communication: Discussed with patient  Disposition Plan: Pending clinical improvement    Consultants:   Cardiology  Procedures: 2D echocardiogram 06/05/2019: Ejection fraction 30 to 35% with severely reduced left ventricular function.   Antimicrobials: None    Subjective: Patient denies any shortness of breath or chest tightness.  He states his leg swelling is improved and he is able to ambulate more with physical therapy with each passing day.  He does note that this morning he was able to get out more urine on his own than he was yesterday.  Objective: Vitals:   06/12/19 0442 06/12/19 0500 06/12/19 0847 06/12/19 1146  BP:   109/69 94/68  Pulse:  (!) 107 (!) 105 (!) 107  Resp:  18 18 20   Temp:  98.2 F (36.8 C) 97.7 F (36.5 C) 98 F (36.7 C)  TempSrc:  Oral Oral Oral  SpO2:  97% 98% 96%  Weight: (!) 175.4 kg     Height:        Intake/Output Summary (Last 24 hours) at 06/12/2019 1150 Last data filed at 06/12/2019 1000 Gross per 24 hour  Intake 720 ml  Output 850 ml  Net -130 ml   Filed Weights   06/10/19 0501 06/11/19 0447 06/12/19 0442  Weight: (!) 176.7 kg (!) 175.1 kg (!) 175.4 kg    Examination:  General exam: Appears  calm and comfortable  Respiratory system: Distant lung sounds.  Respiratory effort normal. Cardiovascular system: S1 & S2 heard, RRR. +JVD, no murmurs, rubs, gallops or clicks.  Distant heart sounds due to morbid obesity. Gastrointestinal system: Abdomen is obese soft and nontender.  Unable to palpate any organ due to abdominal distention.   Central nervous system: Alert and oriented. No focal neurological deficits. Extremities: Symmetric 5 x 5 power.  Trace pitting pedal edema up to mid shin Skin: Multiple ecchymotic areas of lower extremity  Psychiatry: Judgement and insight appear normal. Mood & affect appropriate.    LOS: 8 days    Time spent: 25 minutes    06/14/19, MD [facility]  If 7PM-7AM, please contact night-coverage [email or web address] Password [email or web address] 06/12/2019, 11:50 AM

## 2019-06-13 LAB — BASIC METABOLIC PANEL
Anion gap: 14 (ref 5–15)
BUN: 28 mg/dL — ABNORMAL HIGH (ref 8–23)
CO2: 28 mmol/L (ref 22–32)
Calcium: 8.8 mg/dL — ABNORMAL LOW (ref 8.9–10.3)
Chloride: 98 mmol/L (ref 98–111)
Creatinine, Ser: 1.28 mg/dL — ABNORMAL HIGH (ref 0.61–1.24)
GFR calc Af Amer: >60 mL/min (ref >60)
GFR calc non Af Amer: 58 mL/min — ABNORMAL LOW (ref >60)
Glucose, Bld: 129 mg/dL — ABNORMAL HIGH (ref 70–99)
Potassium: 3.8 mmol/L (ref 3.5–5.1)
Sodium: 140 mmol/L (ref 135–145)

## 2019-06-13 LAB — GLUCOSE, CAPILLARY
Glucose-Capillary: 149 mg/dL — ABNORMAL HIGH (ref 70–99)
Glucose-Capillary: 317 mg/dL — ABNORMAL HIGH (ref 70–99)

## 2019-06-13 MED ORDER — FUROSEMIDE 40 MG PO TABS: 40.0000 mg | ORAL_TABLET | Freq: Two times a day (BID) | ORAL | Status: DC

## 2019-06-13 MED ORDER — PHENAZOPYRIDINE HCL 100 MG PO TABS: 100.0000 mg | ORAL_TABLET | Freq: Three times a day (TID) | ORAL | 0 refills | Status: DC

## 2019-06-13 MED ORDER — TAMSULOSIN HCL 0.4 MG PO CAPS: 0.4000 mg | ORAL_CAPSULE | Freq: Every day | ORAL | 0 refills | Status: DC

## 2019-06-13 MED ORDER — POTASSIUM CHLORIDE CRYS ER 20 MEQ PO TBCR: 20.0000 meq | EXTENDED_RELEASE_TABLET | Freq: Every day | ORAL | 0 refills | Status: DC

## 2019-06-13 NOTE — Progress Notes (Addendum)
Progress Note  Patient Name: Daniel Valenzuela Date of Encounter: 06/13/2019  Primary Cardiologist: Lewayne Bunting, MD   Subjective   No SOB or CP.  He put out 800cc yesterday and is net neg 4.1L.  Inpatient Medications    Scheduled Meds: . atorvastatin  80 mg Oral QPM  . buPROPion  300 mg Oral Daily  . chlorhexidine  15 mL Mouth/Throat BID  . Chlorhexidine Gluconate Cloth  6 each Topical Daily  . enoxaparin (LOVENOX) injection  80 mg Subcutaneous Q24H  . escitalopram  20 mg Oral QPM  . furosemide  80 mg Oral BID  . gabapentin  1,200 mg Oral QHS  . gabapentin  600 mg Oral Q breakfast  . insulin aspart  0-15 Units Subcutaneous TID WC  . insulin aspart  0-5 Units Subcutaneous QHS  . insulin aspart  12 Units Subcutaneous TID WC  . insulin NPH Human  20 Units Subcutaneous QHS  . insulin NPH Human  25 Units Subcutaneous QAC breakfast  . lisinopril  10 mg Oral Daily  . pantoprazole  40 mg Oral BID  . phenazopyridine  100 mg Oral TID WC  . potassium chloride  20 mEq Oral Daily  . tamsulosin  0.4 mg Oral QPC supper   Continuous Infusions:  PRN Meds: HYDROcodone-acetaminophen   Vital Signs    Vitals:   06/12/19 2013 06/12/19 2333 06/13/19 0434 06/13/19 0855  BP: 98/86  107/75 108/71  Pulse: (!) 108  93 (!) 109  Resp: 18 19 20 20   Temp: 97.7 F (36.5 C)  98.1 F (36.7 C) 97.9 F (36.6 C)  TempSrc: Oral  Oral Oral  SpO2: 98%  96% 92%  Weight:   (!) 176 kg   Height:        Intake/Output Summary (Last 24 hours) at 06/13/2019 0859 Last data filed at 06/12/2019 2210 Gross per 24 hour  Intake 480 ml  Output 800 ml  Net -320 ml   Last 3 Weights 06/13/2019 06/12/2019 06/11/2019  Weight (lbs) 387 lb 14.4 oz 386 lb 11.2 oz 386 lb  Weight (kg) 175.95 kg 175.406 kg 175.088 kg      Telemetry    NSR- Personally Reviewed  ECG    No new EKG to review - Personally Reviewed  Physical Exam   GEN: Well nourished, well developed in no acute distress HEENT: Normal NECK:  No JVD; No carotid bruits LYMPHATICS: No lymphadenopathy CARDIAC:RRR, no murmurs, rubs, gallops RESPIRATORY:  Clear to auscultation without rales, wheezing or rhonchi  ABDOMEN: Soft, non-tender, non-distended MUSCULOSKELETAL:  No edema; No deformity  SKIN: Warm and dry NEUROLOGIC:  Alert and oriented x 3 PSYCHIATRIC:  Normal affect    Labs    High Sensitivity Troponin:   Recent Labs  Lab 06/04/19 0943 06/04/19 1331  TROPONINIHS 109* 110*      Chemistry Recent Labs  Lab 06/07/19 0450  06/11/19 0458 06/12/19 0558 06/13/19 0419  NA 142   < > 140 138 140  K 3.9   < > 3.8 3.9 3.8  CL 99   < > 97* 94* 98  CO2 32   < > 31 30 28   GLUCOSE 126*   < > 133* 163* 129*  BUN 18   < > 19 22 28*  CREATININE 1.03   < > 0.97 1.13 1.28*  CALCIUM 8.9   < > 8.7* 8.8* 8.8*  PROT 6.0*  --   --   --   --   ALBUMIN 3.1*  --   --   --   --  AST 26  --   --   --   --   ALT 16  --   --   --   --   ALKPHOS 78  --   --   --   --   BILITOT 1.0  --   --   --   --   GFRNONAA >60   < > >60 >60 58*  GFRAA >60   < > >60 >60 >60  ANIONGAP 11   < > 12 14 14    < > = values in this interval not displayed.     Hematology Recent Labs  Lab 06/07/19 0450 06/10/19 0429 06/12/19 0558  WBC 10.8* 9.0 11.9*  RBC 4.25 3.85* 4.28  HGB 12.3* 11.2* 12.5*  HCT 38.4* 35.5* 39.3  MCV 90.4 92.2 91.8  MCH 28.9 29.1 29.2  MCHC 32.0 31.5 31.8  RDW 15.4 15.5 15.7*  PLT 246 235 273    BNP No results for input(s): BNP, PROBNP in the last 168 hours.   DDimer No results for input(s): DDIMER in the last 168 hours.   Radiology    Dg Knee 1-2 Views Left  Result Date: 06/12/2019 CLINICAL DATA:  Fall EXAM: LEFT KNEE - 1-2 VIEW COMPARISON:  None. FINDINGS: Tricompartment degenerative changes within the left knee. No joint effusion. No acute bony abnormality. Specifically, no fracture, subluxation, or dislocation. IMPRESSION: No acute bony abnormality. Moderate tricompartment degenerative changes. Electronically  Signed   By: Charlett Nose M.D.   On: 06/12/2019 19:53    Cardiac Studies   Echo 06/05/19 1. Left ventricular ejection fraction, by visual estimation, is 30 to 35%. The left ventricle has moderate to severely decreased function. There is no left ventricular hypertrophy. 2. Definity contrast agent was given IV to delineate the left ventricular endocardial borders. 3. Left ventricular diastolic parameters are indeterminate. 4. Moderately dilated left ventricular internal cavity size. 5. Diffuse hypokinesis worse in the apex and septum. 6. Global right ventricle has normal systolic function.The right ventricular size is normal. No increase in right ventricular wall thickness. 7. Left atrial size was moderately dilated. 8. Right atrial size was not well visualized. 9. Moderate mitral annular calcification. 10. Severe calcification of the mitral valve leaflet(s). 11. Severe thickening of the mitral valve leaflet(s). 12. The mitral valve is degenerative. Trace mitral valve regurgitation. 13. The tricuspid valve is normal in structure. Tricuspid valve regurgitation is mild. 14. The aortic valve is tricuspid. Aortic valve regurgitation is not visualized. Severe aortic valve stenosis. 15. Severely calcified with restricted leaflet motion Likely low flow severe AS DVI 0.20 mean gradient increased from 28 mmHg to 35 mmHg peak increased from 47 to 51 mmHg since January. 16. The pulmonic valve was grossly normal. Pulmonic valve regurgitation is mild. 17. The interatrial septum was not well visualized.    Patient Profile     68 y.o. male with a hx of chronic systolic and diastolic HF and aortic stenosis, non obstructive disease by cath 07/2018, hx of syncope, OSA, DM now admitted with acute on chronic systolic and diastolic HF.  Assessment & Plan    1.  Acute on chronic systolic and diastolic heart failure - self reported weight gain, about 40 lbs since July - echo this admit LVEF  30-35%, severe AS. In Jan 2020 LVEF 35-40%, mod AS Jan 2020 cath mild to mod CAD - he put out 800cc yesterday and is net neg 4.1L - peak weight 180.2 kg and now down to 176kg - he  is on room air, not short of breath.  - He ambulated yesterday with RA O2sat 97% and with ambulation 92%.  No indication for Hone O2 - Unclear how much weight gain at baseline is due to fluid vs. Increased caloric intake/decreased exercise - fluid restriction in place. - no metoprolol given conduction disease and prior syncope - Stop Lisinopril due to soft BP (85OYDX systolic this am) - creatinine bumped to 1.28 today - decrease Lasix to 40mg  BID (was on 60mg  daily at home) - follow renal function closely  2.  Aortic stenosis, now severe -appreciate evaluation of structural heart team -ct tavr done 11/11 -plan to followup with CVTS outpt for workup for TAVR  3. Conduction disease, with very long PR -suspect he may need pacemaker with TAVR  Patient stable from cardiac standpoint for discharge home.  Will complete TAVR workup as outpt.  [facility] will sign off.   Medication Recommendations:  Atorvastatin 80mg  daily, Lasix 40mg  BID, Kdur 15meq daily Other recommendations (labs, testing, etc):  BMET on Tuesday 11/17 Follow up as an outpatient:  Dr. Angelena Form and Dr. Cyndia Bent for TAVR workup  For questions or updates, please contact [facility] Please consult [email or web address] for contact info under     Signed, Fransico Him, MD  06/13/2019, 8:59 AM

## 2019-06-13 NOTE — Progress Notes (Signed)
Patient BP is 93/67 HR 106. MD is in the room and was told to hold the AM lisinopril.

## 2019-06-13 NOTE — Discharge Summary (Signed)
Physician Discharge Summary  Patient ID: Daniel Valenzuela MRN: [id number] DOB/AGE: 03-Sep-1950 68 y.o.  Admit date: 06/04/2019 Discharge date: 06/13/2019  Admission Diagnoses: Acute combined systolic and diastolic CHF  Discharge Diagnoses:  Principal Problem:   Acute combined systolic and diastolic CHF Active Problems:   Diabetic neuropathy (HCC)   High cholesterol   Shortness of breath   Type 2 diabetes mellitus with complication, with long-term current use of insulin (HCC)   Morbid obesity with BMI of 45.0-49.9, adult (HCC)   CAD (coronary artery disease)   CHF exacerbation (HCC)   Discharged Condition: fair  Hospital Course:   Brief Narrative:  68 year old male with history of insulin-dependent diabetes mellitus type 2, heart failure with reduced ejection fraction (last echocardiogram on January 2020 showed ejection fraction of 35 to 40%, hypertension, hyperlipidemia admitted with acute shortness of breath worsening bilateral and abdominal edema.  Patient endorsed about 40 pounds weight gain.  He admitted to frequent consumption of coffee and beverages, mostly unsweetened tea which has been ongoing for the past several months.  He takes Lasix p.o. 40 mg daily which appears not to be effective as a highly makes much urine after taking Lasix. In the ED, he has elevated BNP, troponin of 110 with a stable creatinine of 0.79. Chest x-ray showed cardiomegaly with bilateral pulmonary interstitial prominence consistent with fluid overload. Echocardiogram done on 06/05/2019 showed estimated ejection fraction of 30 to 35%, when compared with echo of January 2020 showed some reduction in ejection fraction.  There is moderate to severe decreased left ventricular function.  There is severe aortic stenosis with aortic valve area less than 1 cm.   1.  Acute exacerbation of heart failure with reduced ejection fraction:  - Much improved to baseline  -  On admission, Patient presenting with worsening  shortness of breath, bilateral pitting pedal edema, abdominal fullness and 40 pound weight gain.  Admitted to increased fluid consumption especially coffee with unsweetened tea. - Repeat TTE in this admission -- ejection fraction of 30 to 35% with moderate to severe decrease left ventricular function and severe aortic stenosis with aortic valve area less than 1 cm. -IV Lasix 80 mg 3 times then  changed to 80mg  PO BID per Cardio. D/C on 40mg  BID (home dose) per Cardio. Renal function slightly worsened. Advised to f/u with PCP and repeat BMP in a week.   -Hold off on beta-blocker due to conduction disease and decompensated state of congestive heart failure.  ACE inhibitor has been restarted and discontinued on discharge per Cardo.  -2 g sodium diet -Fluid restriction to 1500 cc per 24 hours -Daily weight - Pt was able to ambulate w/o supp O2 and maintained sat >92% this AM  - Pt will f/u OP Cardiology   2.  Diabetes mellitus type 2, with long-term insulin use/diabetic neuropathy.  Hemoglobin A1c 8.4 - BG stable  - Was on sliding scale insulin - Resume home regimen; Important to f/u PCP with BG's log for further adjustment. - Low Carb diet 1800 - 2kal  -Continue with gabapentin  3.  Hyperlipidemia Continue with atorvastatin 80 mg daily  4.  Essential hypertension: No acute complaints or issues   Blood pressure currently running low without compensatory/reflex tachycardia concerning for possible cardiac ischemia - On D/C ACE-I  - Monitor BP daily and take the log to PCP and or Cardiologist   5.  Elevated troponin: no acute issues  - On admission,  High-sensitivity troponin elevated at 110. EKG showed AV  conduction delay with T wave inversion in leads V1.   When compared with EKG of August 10, 2018, appears not to be significant - F/U OP   6.  Morbid obesity BMI 54.91 Nutritional referral - Counseled on diet   7.  Severe aortic stenosis -Structural heart team consulted; TAVR  work-up has been completed - Have a procedure upcoming per Cardio. Pt given the Appt.   8. Urinary retention: Improving  - foley recently removed but urine output decreased so is likely retaining again - tamsulosin started   - UOp maintained adequately   9. Dysuria: Resolved  - no fever  - May be from recent cath and discontinuation - May start Pyridium  - Monitor S/S    Consults: cardiology  Significant Diagnostic Studies: Echo, Imaging   Treatments: Diuresis, Medication management   Discharge Exam: Blood pressure 108/71, pulse (!) 109, temperature 97.9 F (36.6 C), temperature source Oral, resp. rate 20, height 5\' 11"  (1.803 m), weight (!) 176 kg, SpO2 92 %.   General exam: Appears calm and comfortable  Respiratory system: Distant lung sounds.  Respiratory effort normal. Cardiovascular system: S1 & S2 heard, RRR. +JVD, no murmurs, rubs, gallops or clicks.  Distant heart sounds due to morbid obesity. Gastrointestinal system: Abdomen is obese soft and nontender.  Unable to palpate any organ due to abdominal distention.   Central nervous system: Alert and oriented. No focal neurological deficits. Extremities: Symmetric 5 x 5 power.  mild edema  Skin: Multiple ecchymotic areas of lower extremity  Psychiatry: Judgement and insight appear normal. Mood & affect appropriate.    Disposition: Discharge disposition: 01-Home or Self Care       Discharge Instructions    (HEART FAILURE PATIENTS) Call MD:  Anytime you have any of the following symptoms: 1) 3 pound weight gain in 24 hours or 5 pounds in 1 week 2) shortness of breath, with or without a dry hacking cough 3) swelling in the hands, feet or stomach 4) if you have to sleep on extra pillows at night in order to breathe.   Complete by: As directed    Call MD for:  difficulty breathing, headache or visual disturbances   Complete by: As directed    Diet - low sodium heart healthy   Complete by: As directed    Increase  activity slowly   Complete by: As directed    Walk with assistance   Complete by: As directed    Walker    Complete by: As directed      Allergies as of 06/13/2019      Reactions   Methyldopa-hydrochlorothiazide Other (See Comments)   GYNOCOMASTIA   Motrin [ibuprofen] Other (See Comments)   GYNOCOMASTIA   Nsaids Other (See Comments)   Patient has had lap band surgery- cannot tolerate this class of meds   Amoxicillin Rash      Medication List    STOP taking these medications   famotidine 40 MG tablet Commonly known as: PEPCID     TAKE these medications   atorvastatin 40 MG tablet Commonly known as: LIPITOR Take 40 mg by mouth daily.   buPROPion 300 MG 24 hr tablet Commonly known as: WELLBUTRIN XL Take 300 mg by mouth daily.   CAL-MAG-ZINC PO Take 1 tablet by mouth daily with breakfast.   chlorhexidine 0.12 % solution Commonly known as: PERIDEX Use as directed 15 mLs in the mouth or throat 2 (two) times daily.   escitalopram 20 MG tablet Commonly known as:  LEXAPRO Take 20 mg by mouth daily.   furosemide 40 MG tablet Commonly known as: LASIX Take 1.5 tablets (60 mg total) by mouth daily.   gabapentin 600 MG tablet Commonly known as: NEURONTIN Take 600-1,200 mg by mouth See admin instructions. Take 600 mg by mouth in the morning and 1,200 mg at bedtime   HYDROcodone-acetaminophen 5-325 MG tablet Commonly known as: NORCO/VICODIN Take 1 tablet by mouth 4 (four) times daily.   multivitamin Tabs tablet Take 1 tablet by mouth daily with breakfast.   NovoLIN N ReliOn 100 UNIT/ML injection Generic drug: insulin NPH Human Inject 40 Units into the skin See admin instructions. Inject 40 units into the skin two to three times a day before meals   NovoLIN R ReliOn 100 units/mL injection Generic drug: insulin regular Inject 20 Units into the skin See admin instructions. Inject 20 units into the skin two to three times a day before meals   pantoprazole 40 MG  tablet Commonly known as: PROTONIX Take 40 mg by mouth every morning.   phenazopyridine 100 MG tablet Commonly known as: PYRIDIUM Take 1 tablet (100 mg total) by mouth 3 (three) times daily with meals.   potassium chloride SA 20 MEQ tablet Commonly known as: KLOR-CON Take 1 tablet (20 mEq total) by mouth daily. Start taking on: June 14, 2019   tamsulosin 0.4 MG Caps capsule Commonly known as: FLOMAX Take 1 capsule (0.4 mg total) by mouth daily after supper.   vitamin B-12 1000 MCG tablet Commonly known as: CYANOCOBALAMIN Take 1,000 mcg by mouth daily.        Signed: Thomasenia Bottoms 06/13/2019, 11:42 AM

## 2019-06-13 NOTE — Plan of Care (Signed)

## 2019-06-13 NOTE — Care Management (Signed)
Requested nurse to reach out to doctor to get scripts either printed or sent to a pharmacy that is open. Scripts originally sent to the [place].

## 2019-06-13 NOTE — Plan of Care (Signed)
  Problem: Education: Goal: Knowledge of General Education information will improve Description: Including pain rating scale, medication(s)/side effects and non-pharmacologic comfort measures Outcome: Adequate for Discharge   Problem: Health Behavior/Discharge Planning: Goal: Ability to manage health-related needs will improve Outcome: Adequate for Discharge Note: Patient understands that he needs to schedule an visit with his PCP to check his kidney function.    Problem: Clinical Measurements: Goal: Ability to maintain clinical measurements within normal limits will improve Outcome: Adequate for Discharge Goal: Will remain free from infection Outcome: Adequate for Discharge Goal: Diagnostic test results will improve Outcome: Adequate for Discharge Goal: Respiratory complications will improve Outcome: Adequate for Discharge Goal: Cardiovascular complication will be avoided Outcome: Adequate for Discharge   Problem: Activity: Goal: Risk for activity intolerance will decrease Outcome: Adequate for Discharge   Problem: Nutrition: Goal: Adequate nutrition will be maintained Outcome: Adequate for Discharge   Problem: Coping: Goal: Level of anxiety will decrease Outcome: Adequate for Discharge   Problem: Elimination: Goal: Will not experience complications related to bowel motility Outcome: Adequate for Discharge Goal: Will not experience complications related to urinary retention Outcome: Adequate for Discharge   Problem: Pain Managment: Goal: General experience of comfort will improve Outcome: Adequate for Discharge   Problem: Safety: Goal: Ability to remain free from injury will improve Outcome: Adequate for Discharge   Problem: Skin Integrity: Goal: Risk for impaired skin integrity will decrease Outcome: Adequate for Discharge   Problem: Education: Goal: Ability to demonstrate management of disease process will improve Outcome: Adequate for Discharge Goal: Ability  to verbalize understanding of medication therapies will improve Outcome: Adequate for Discharge Goal: Individualized Educational Video(s) Outcome: Adequate for Discharge   Problem: Activity: Goal: Capacity to carry out activities will improve Outcome: Adequate for Discharge   Problem: Cardiac: Goal: Ability to achieve and maintain adequate cardiopulmonary perfusion will improve Outcome: Adequate for Discharge

## 2019-06-15 LAB — GLUCOSE, CAPILLARY: Glucose-Capillary: 67 mg/dL — ABNORMAL LOW (ref 70–99)

## 2019-06-17 DIAGNOSIS — I350 Nonrheumatic aortic (valve) stenosis: Secondary | ICD-10-CM | POA: Diagnosis not present

## 2019-06-17 DIAGNOSIS — R339 Retention of urine, unspecified: Secondary | ICD-10-CM | POA: Diagnosis not present

## 2019-06-17 DIAGNOSIS — I5022 Chronic systolic (congestive) heart failure: Secondary | ICD-10-CM | POA: Diagnosis not present

## 2019-06-18 ENCOUNTER — Other Ambulatory Visit: Payer: Self-pay

## 2019-06-18 ENCOUNTER — Other Ambulatory Visit: Payer: Self-pay | Admitting: *Deleted

## 2019-06-18 ENCOUNTER — Encounter: Payer: Self-pay | Admitting: Thoracic Surgery (Cardiothoracic Vascular Surgery)

## 2019-06-18 ENCOUNTER — Institutional Professional Consult (permissible substitution) (INDEPENDENT_AMBULATORY_CARE_PROVIDER_SITE_OTHER): Payer: Medicare Other | Admitting: Thoracic Surgery (Cardiothoracic Vascular Surgery)

## 2019-06-18 VITALS — BP 74/53 | HR 100 | Temp 97.8°F | Resp 20 | Ht 71.0 in | Wt 376.0 lb

## 2019-06-18 DIAGNOSIS — I350 Nonrheumatic aortic (valve) stenosis: Secondary | ICD-10-CM

## 2019-06-18 DIAGNOSIS — N390 Urinary tract infection, site not specified: Secondary | ICD-10-CM

## 2019-06-18 DIAGNOSIS — I2510 Atherosclerotic heart disease of native coronary artery without angina pectoris: Secondary | ICD-10-CM

## 2019-06-18 IMAGING — CT CT HEAD W/O CM
3 series · 16 of 47 positions shown, 19 images · non-contrast
Comparison: None.

CLINICAL DATA: Pt states he had syncopal episode while at home
today, hit back of head on kitchen floor. DM, HTN

EXAM:
CT HEAD WITHOUT CONTRAST
TECHNIQUE: Contiguous axial images were obtained from the base of the skull
through the vertex without intravenous contrast.

[Series 2: head wo · axial · 0.43mm/px · z∈[+54,+199]mm · 10 of 35 slices shown, 13 images]
[im 3/35  brain]
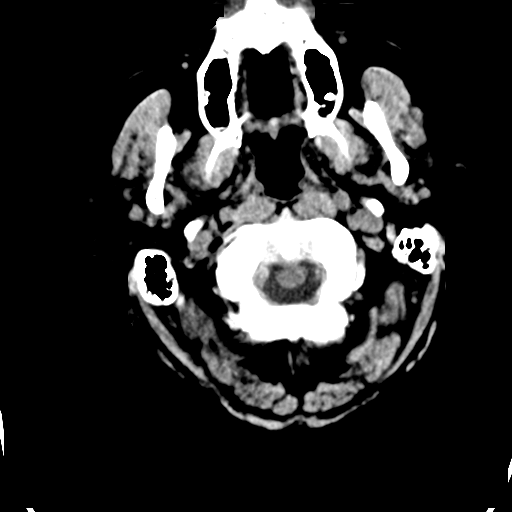
[im 3/35  bone]
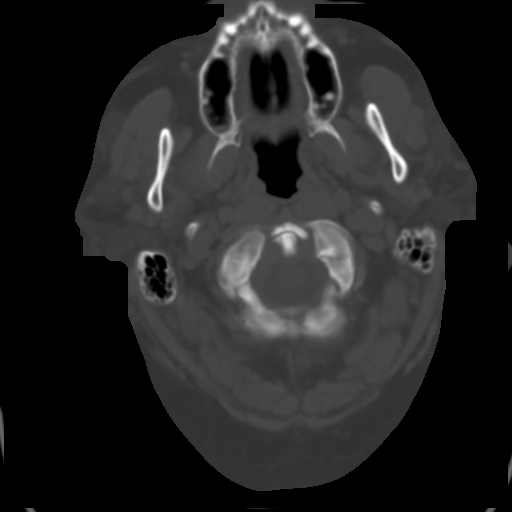
[im 6/35  brain]
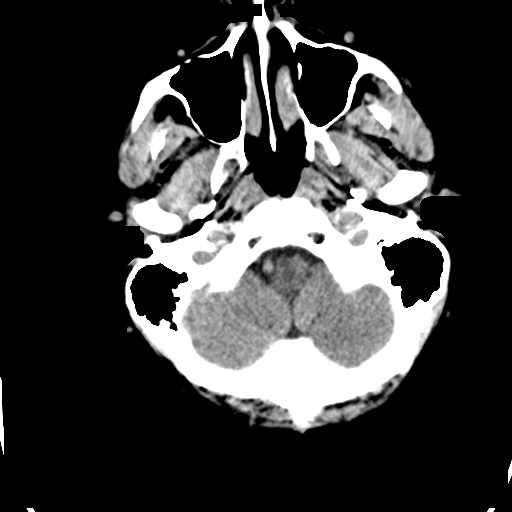
[im 10/35  brain]
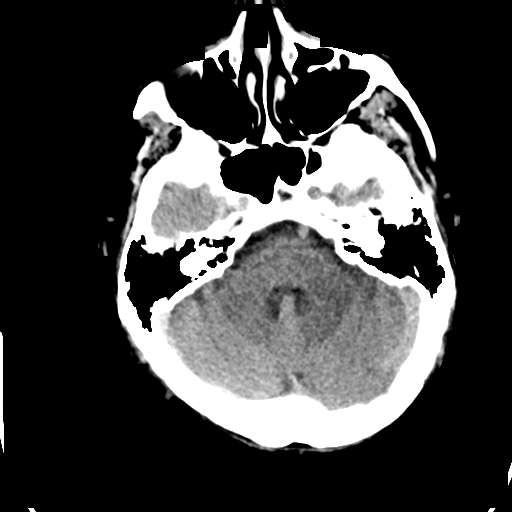
[im 12/35  brain]
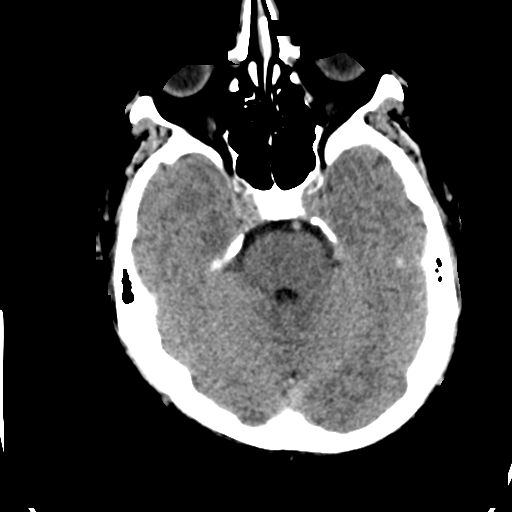
[im 16/35  brain]
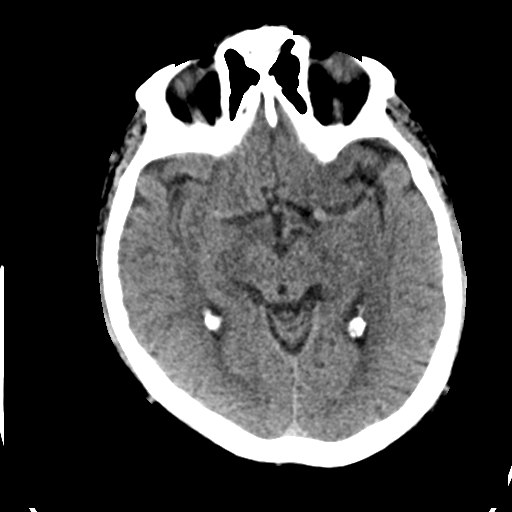
[im 16/35  bone]
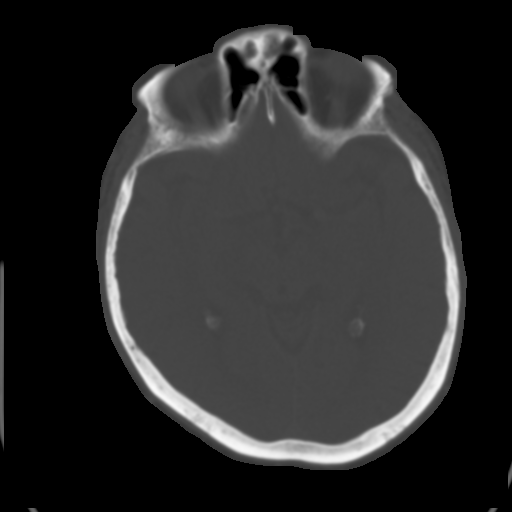
[im 19/35  brain]
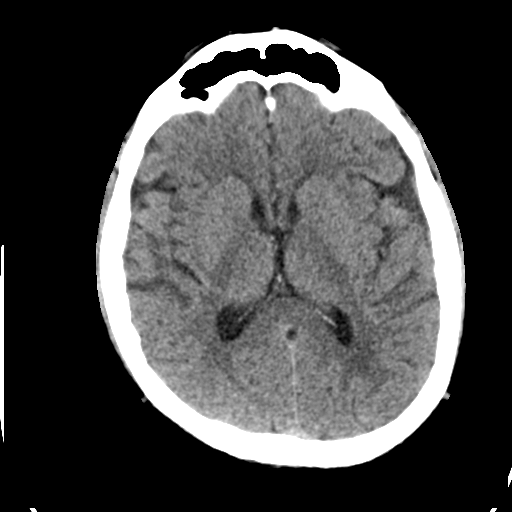
[im 23/35  brain]
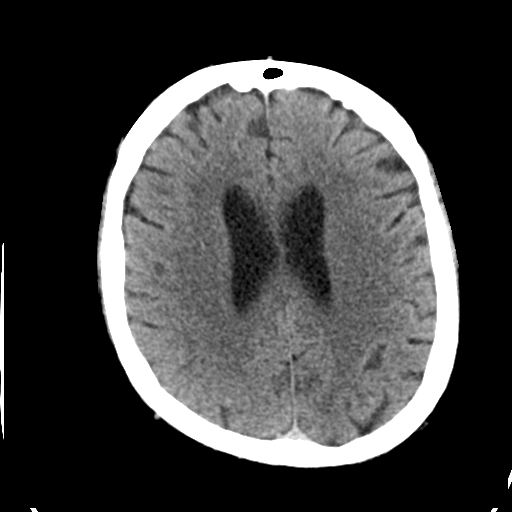
[im 26/35  brain]
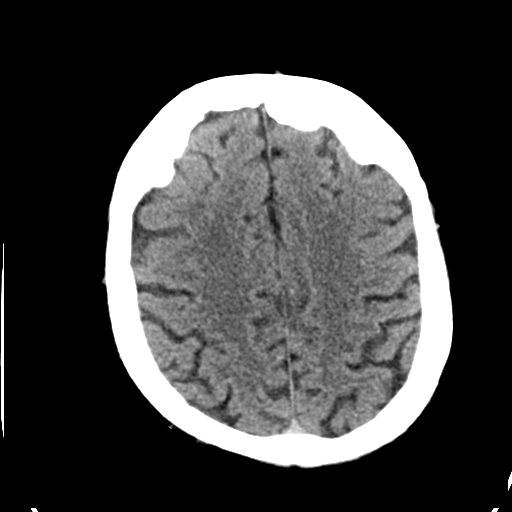
[im 29/35  brain]
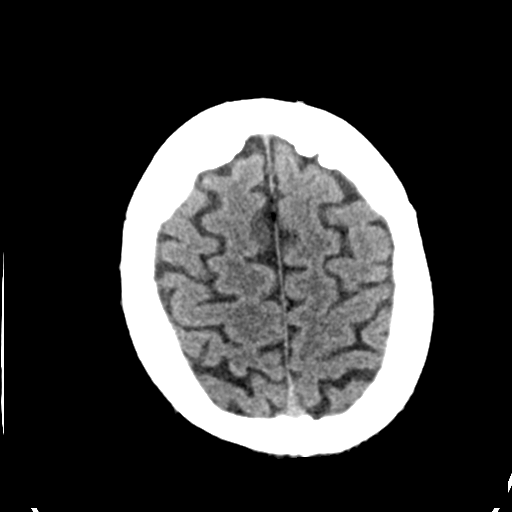
[im 29/35  bone]
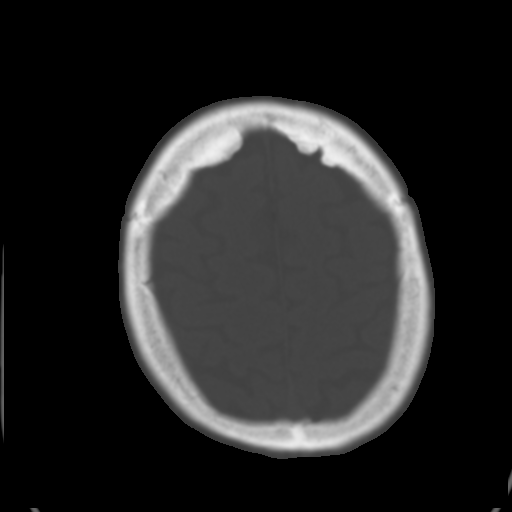
[im 32/35  brain]
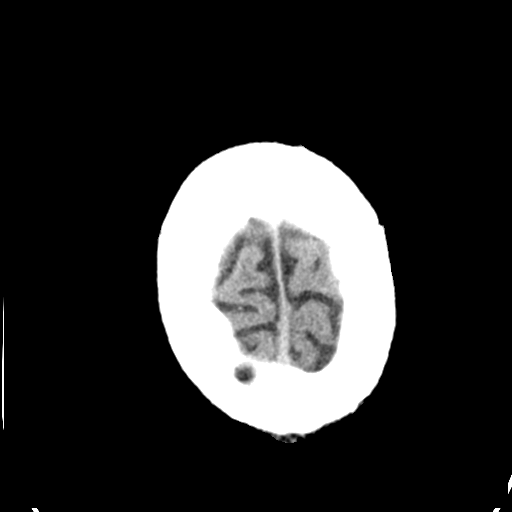

[Series 4: coronal soft tissue · coronal · 0.36mm/px · 3 of 72 slices shown]
[im 24/72  brain]
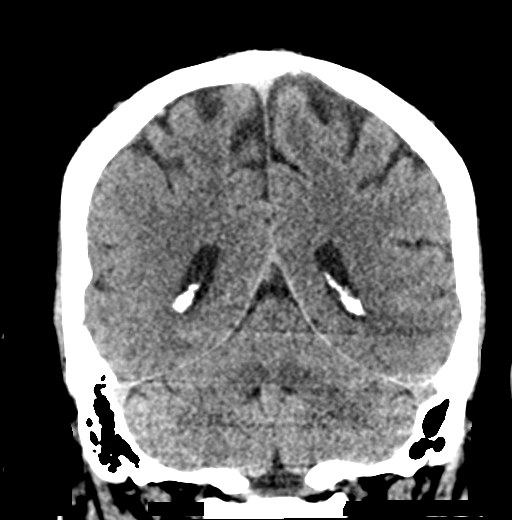
[im 32/72  brain]
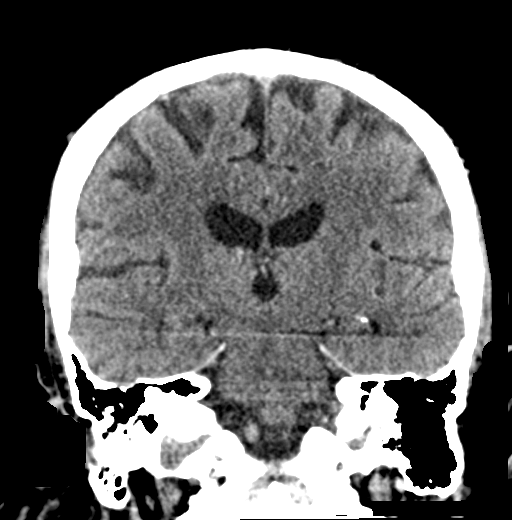
[im 40/72  brain]
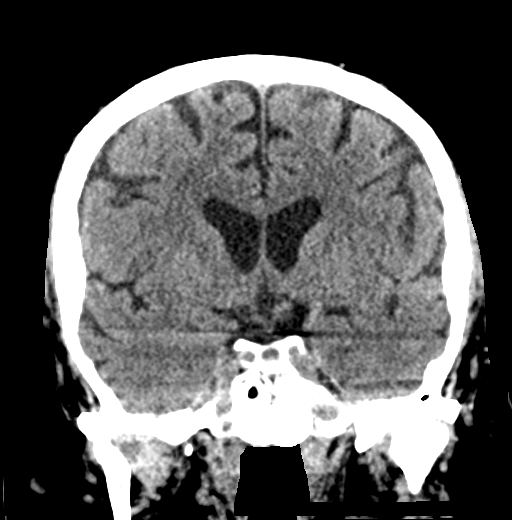

[Series 5: sagittal soft tissue · sagittal · 0.39mm/px · 3 of 61 slices shown]
[im 21/61  brain]
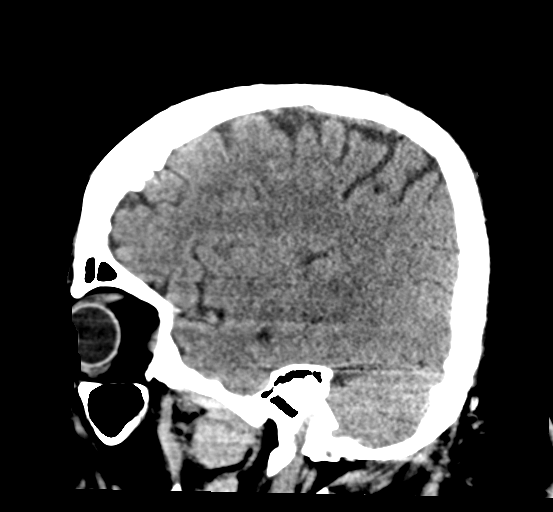
[im 31/61  brain]
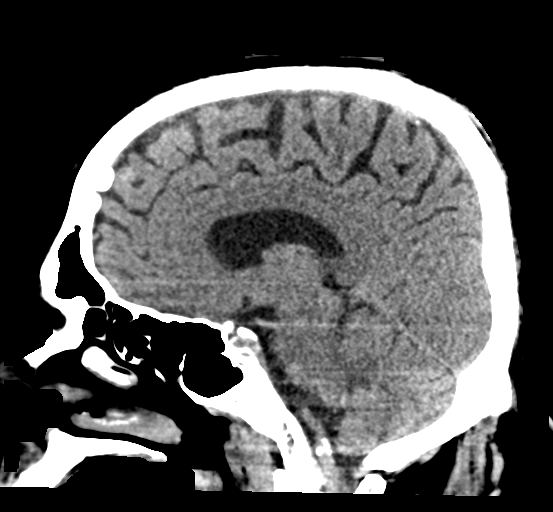
[im 41/61  brain]
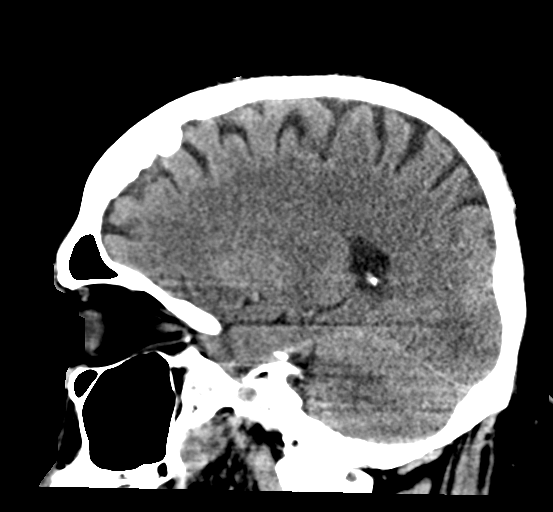

[16 of 47 positions shown; findings below may reference images not displayed]

FINDINGS: Brain: No evidence of acute infarction, hemorrhage, hydrocephalus,
extra-axial collection or mass lesion/mass effect.

Vascular: No hyperdense vessel or unexpected calcification.

Skull: Normal. Negative for fracture or focal lesion.

Sinuses/Orbits: No acute finding.

Other: None.
IMPRESSION: Negative for bleed or other acute intracranial process.

## 2019-06-18 NOTE — Progress Notes (Signed)
[facility]  MULTIDISCIPLINARY HEART VALVE CLINIC  CARDIOTHORACIC SURGERY CONSULTATION REPORT  Primary Cardiologist is Lewayne Bunting, MD PCP is Carylon Perches, MD  Chief Complaint  Patient presents with   Aortic Stenosis    Surgical eval for TAVR vs SAVR, review all testing    HPI:  Patient is a 68 year old morbidly obese male with history of aortic stenosis, hypertension, insulin-dependent type 2 diabetes mellitus with complications, obstructive sleep apnea on CPAP, chronic right bundle branch block and first-degree AV block, syncope, GE reflux disease, and chronic combined systolic and diastolic congestive heart failure who has been referred for surgical consultation to discuss treatment options for management of severe symptomatic aortic stenosis.  Patient's cardiac history dates back to February 2019 when he was admitted following a syncopal episode.  Etiology was eventually attributed to vasovagal symptoms and have not recurred since verapamil was discontinued, but the patient underwent loop recorder implantation because of underlying right bundle branch block with long first-degree AV block.  Echocardiogram at that time revealed normal left ventricular systolic function with mild aortic stenosis.  He has been followed intermittently ever since by Dr. Ladona Ridgel and has never had any evidence of syncope or significant advanced conduction system disease based upon loop recorder.  Patient was hospitalized in January 2020 with acute exacerbation of chronic congestive heart failure and massive volume overload.  Echocardiogram performed at that time revealed significant left ventricular systolic dysfunction with ejection fraction estimated only 35 to 40%.  There was felt to be moderate to severe aortic stenosis with mean transvalvular gradient estimated 28 mmHg and aortic valve area calculated 1.34 cm.  The patient underwent diagnostic cardiac catheterization which revealed mild to  moderate nonobstructive coronary artery disease with moderate aortic stenosis based on mean transvalvular gradient measured 23 mmHg at catheterization and aortic valve area calculated 1.4 cm.  The patient was recently readmitted to the hospital earlier this month with another acute exacerbation of chronic congestive heart failure and pulmonary edema.  Follow-up echocardiogram revealed left ventricular ejection fraction down further to 30-35%.  Despite this there was evidence of severe aortic stenosis with mean transvalvular gradient estimated 35 mmHg, DVI reported 0.20 and aortic valve area calculated only 0.83 cm.  Patient was referred to the multidisciplinary heart valve team and has been evaluated previously by Dr. Clifton James.  The patient also was seen in consultation by Dr. Robin Searing from the dental service and cleared for surgical intervention.  CT angiography was performed and the patient referred for surgical consultation.  The patient is married and lives with his wife in [place], [place].  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  He has been disabled secondary to chronic back pain with peripheral neuropathy, limited mobility, and carpal tunnel syndrome.  Over the past year or more he has had progressive worsening of fatigue with exertional shortness of breath, chronic respiratory failure, and chronic lower extremity edema.  He gets short of breath with very low level activity and has been hospitalized on 2 occasions for acute exacerbation of resting shortness of breath and pulmonary edema.  He denies any history of chest pain or chest tightness either with activity or at rest.  He has not had any syncopal episodes since the episode he experienced in 2019.  He cannot lay flat but.  He has severe lower extremity edema.  He currently also complains of urinary urgency and a foul odor to his urine.  He states that he has had some  skin infections in the past underneath his  panniculus but none recently.  Past Medical History:  Diagnosis Date   Aortic stenosis    Aortic stenosis, moderate 07/2018   felt to be moderate at cath Jan 2020   CAD (coronary artery disease) 07/2018   mild non obstructive   Chronic back pain    "mainly lower right now" (08/12/2018)   Chronic combined systolic and diastolic heart failure (HCC)    Depression    Diabetic neuropathy (HCC)    GERD (gastroesophageal reflux disease)    High cholesterol    Hypertension    Morbid obesity with BMI of 45.0-49.9, adult (HCC)    Morbid obesity with body mass index of 50 or higher (HCC)    NICM (nonischemic cardiomyopathy) (HCC) 07/2018   EF 35-40%   RBBB    Sleep apnea    "lost weight; not on CPAP anymore" (08/12/2018)   Spinal stenosis of lumbar region    "w/slipped disc" (08/12/2018)   Syncope    Felt to be secondary to autonomic dysfunction-s/p Loop recorder Feb 2019   Type II diabetes mellitus (HCC)     Past Surgical History:  Procedure Laterality Date   HERNIA REPAIR     LAPAROSCOPIC GASTRIC BANDING     LOOP RECORDER INSERTION N/A 09/01/2017   Procedure: LOOP RECORDER INSERTION;  Surgeon: Duke SalviaKlein, Steven C, MD;  [facility];  Service: Cardiovascular;  Laterality: N/A;   RIGHT/LEFT HEART CATH AND CORONARY ANGIOGRAPHY N/A 08/14/2018   Procedure: RIGHT/LEFT HEART CATH AND CORONARY ANGIOGRAPHY;  Surgeon: Yvonne KendallEnd, Christopher, MD;  Location: [facility] INVASIVE CV LAB;  Service: Cardiovascular;  Laterality: N/A;   SHOULDER ARTHROSCOPY Right    TONSILLECTOMY     UMBILICAL HERNIA REPAIR      Family History  Problem Relation Age of Onset   Cancer Mother    Diabetes Mother    Parkinson's disease Father    Heart failure Father     Social History   Socioeconomic History   Marital status: Married    Spouse name: Not on file   Number of children: Not on file   Years of education: Not on file   Highest education level: Not on file  Occupational  History   Not on file  Social Needs   Financial resource strain: Not on file   Food insecurity    Worry: Not on file    Inability: Not on file   Transportation needs    Medical: Not on file    Non-medical: Not on file  Tobacco Use   Smoking status: Never Smoker   Smokeless tobacco: Never Used  Substance and Sexual Activity   Alcohol use: No    Frequency: Never   Drug use: Never   Sexual activity: Not Currently  Lifestyle   Physical activity    Days per week: Not on file    Minutes per session: Not on file   Stress: Not on file  Relationships   Social connections    Talks on phone: Not on file    Gets together: Not on file    Attends religious service: Not on file    Active member of club or organization: Not on file    Attends meetings of clubs or organizations: Not on file    Relationship status: Not on file   Intimate partner violence    Fear of current or ex partner: Not on file    Emotionally abused: Not on file  Physically abused: Not on file    Forced sexual activity: Not on file  Other Topics Concern   Not on file  Social History Narrative   Not on file    Current Outpatient Medications  Medication Sig Dispense Refill   atorvastatin (LIPITOR) 40 MG tablet Take 40 mg by mouth daily.      buPROPion (WELLBUTRIN XL) 300 MG 24 hr tablet Take 300 mg by mouth daily.      Calcium-Magnesium-Zinc (CAL-MAG-ZINC PO) Take 1 tablet by mouth daily with breakfast.     chlorhexidine (PERIDEX) 0.12 % solution Use as directed 15 mLs in the mouth or throat 2 (two) times daily. 120 mL 6   escitalopram (LEXAPRO) 20 MG tablet Take 20 mg by mouth daily.     furosemide (LASIX) 40 MG tablet Take 1.5 tablets (60 mg total) by mouth daily. 45 tablet 0   gabapentin (NEURONTIN) 600 MG tablet Take 600-1,200 mg by mouth See admin instructions. Take 600 mg by mouth in the morning and 1,200 mg at bedtime     HYDROcodone-acetaminophen (NORCO/VICODIN) 5-325 MG tablet  Take 1 tablet by mouth 4 (four) times daily.      insulin NPH Human (NOVOLIN N RELION) 100 UNIT/ML injection Inject 40 Units into the skin See admin instructions. Inject 40 units into the skin two to three times a day before meals     insulin regular (NOVOLIN R RELION) 100 units/mL injection Inject 20 Units into the skin See admin instructions. Inject 20 units into the skin two to three times a day before meals     metoprolol tartrate (LOPRESSOR) 25 MG tablet Take 25 mg by mouth daily.     multivitamin (ONE-A-DAY MEN'S) TABS tablet Take 1 tablet by mouth daily with breakfast.     pantoprazole (PROTONIX) 40 MG tablet Take 40 mg by mouth every morning.     potassium chloride SA (KLOR-CON) 20 MEQ tablet Take 1 tablet (20 mEq total) by mouth daily. 15 tablet 0   tamsulosin (FLOMAX) 0.4 MG CAPS capsule Take 1 capsule (0.4 mg total) by mouth daily after supper. 30 capsule 0   vitamin B-12 (CYANOCOBALAMIN) 1000 MCG tablet Take 1,000 mcg by mouth daily.     phenazopyridine (PYRIDIUM) 100 MG tablet Take 1 tablet (100 mg total) by mouth 3 (three) times daily with meals. (Patient not taking: Reported on 06/18/2019) 10 tablet 0   No current facility-administered medications for this visit.     Allergies  Allergen Reactions   Methyldopa-Hydrochlorothiazide Other (See Comments)    GYNOCOMASTIA   Motrin [Ibuprofen] Other (See Comments)    GYNOCOMASTIA   Nsaids Other (See Comments)    Patient has had lap band surgery- cannot tolerate this class of meds   Amoxicillin Rash      Review of Systems:   General:  normal appetite, decreased energy, + weight gain, + weight loss, no fever  Cardiac:  no chest pain with exertion, no chest pain at rest, + SOB with minimal exertion, occasional resting SOB, no PND, + orthopnea, no palpitations, no arrhythmia, no atrial fibrillation, + LE edema, no dizzy spells, + syncope  Respiratory:  + chronic shortness of breath, no home oxygen, no productive cough,  + dry cough, no bronchitis, no wheezing, no hemoptysis, no asthma, no pain with inspiration or cough, + sleep apnea, + CPAP at night  GI:   no difficulty swallowing, + reflux, no frequent heartburn, no hiatal hernia, no abdominal pain, no constipation, no diarrhea, no hematochezia,  no hematemesis, no melena  GU:   + dysuria,  + frequency, ? urinary tract infection, no hematuria, no enlarged prostate, no kidney stones, no kidney disease  Vascular:  + pain suggestive of claudication, + pain in feet, no leg cramps, no varicose veins, no DVT, no non-healing foot ulcer  Neuro:   no stroke, no TIA's, no seizures, no headaches, no temporary blindness one eye,  no slurred speech, + peripheral neuropathy, + chronic pain, + instability of gait, no memory/cognitive dysfunction  Musculoskeletal: + arthritis, + joint swelling, no myalgias, + difficulty walking, limited mobility   Skin:   no rash, no itching, no skin infections, no pressure sores or ulcerations  Psych:   no anxiety, no depression, no nervousness, no unusual recent stress  Eyes:   no blurry vision, no floaters, no recent vision changes, does not wears glasses or contacts  ENT:   no hearing loss, no loose or painful teeth, no dentures, last saw dentist in the hospital  Hematologic:  no easy bruising, no abnormal bleeding, no clotting disorder, no frequent epistaxis  Endocrine:  + diabetes, does check CBG's at home           Physical Exam:   BP (!) 74/53    Pulse 100    Temp 97.8 F (36.6 C) (Skin)    Resp 20    Ht 5\' 11"  (1.803 m)    Wt (!) 376 lb (170.6 kg)    SpO2 90% Comment: RA   BMI 52.44 kg/m   General:  Morbidly obese male NAD    HEENT:  Unremarkable   Neck:   no JVD, no bruits, no adenopathy   Chest:   clear to auscultation, symmetrical breath sounds, no wheezes, no rhonchi   CV:   RRR, grade II/VI crescendo/decrescendo murmur heard best at RSB,  no diastolic murmur  Abdomen:  soft, non-tender, no masses   Extremities:  warm,  well-perfused, pulses not palpable, + bilateral LE edema  Rectal/GU  Deferred  Neuro:   Grossly non-focal and symmetrical throughout  Skin:   Clean and dry, no rashes, no breakdown   Diagnostic Tests:    ECHOCARDIOGRAM REPORT       Patient Name:   Daniel Valenzuela Date of Exam: 06/05/2019 Medical Rec #:  [id number]      Height:       71.0 in Accession #:    [id number]     Weight:       393.7 lb Date of Birth:  January 10, 1951      BSA:          2.81 m Patient Age:    67 years       BP:           103/32 mmHg Patient Gender: M              HR:           86 bpm. Exam Location:  Inpatient  Procedure: 2D Echo  Indications:    acute systolic CHF 428.21   History:        Patient has prior history of Echocardiogram examinations, most                 recent 08/13/2018. CHF, CAD; Aortic Valve Disease                 Signs/Symptoms:Dyspnea Risk Factors:Diabetes and Sleep Apnea.   Sonographer:    Delcie Roch Referring Phys: [phone] STEPHEN K CHIU  Sonographer Comments: Technically difficult study due to poor echo windows. Image acquisition challenging due to patient body habitus. IMPRESSIONS    1. Left ventricular ejection fraction, by visual estimation, is 30 to 35%. The left ventricle has moderate to severely decreased function. There is no left ventricular hypertrophy.  2. Definity contrast agent was given IV to delineate the left ventricular endocardial borders.  3. Left ventricular diastolic parameters are indeterminate.  4. Moderately dilated left ventricular internal cavity size.  5. Diffuse hypokinesis worse in the apex and septum.  6. Global right ventricle has normal systolic function.The right ventricular size is normal. No increase in right ventricular wall thickness.  7. Left atrial size was moderately dilated.  8. Right atrial size was not well visualized.  9. Moderate mitral annular calcification. 10. Severe calcification of the mitral valve leaflet(s). 11. Severe  thickening of the mitral valve leaflet(s). 12. The mitral valve is degenerative. Trace mitral valve regurgitation. 13. The tricuspid valve is normal in structure. Tricuspid valve regurgitation is mild. 14. The aortic valve is tricuspid. Aortic valve regurgitation is not visualized. Severe aortic valve stenosis. 15. Severely calcified with restricted leaflet motion Likely low flow severe AS DVI 0.20 mean gradient increased from 28 mmHg to 35 mmHg peak increased from 47 to 51 mmHg since January. 16. The pulmonic valve was grossly normal. Pulmonic valve regurgitation is mild. 17. The interatrial septum was not well visualized.  FINDINGS  Left Ventricle: Left ventricular ejection fraction, by visual estimation, is 30 to 35%. The left ventricle has moderate to severely decreased function. Definity contrast agent was given IV to delineate the left ventricular endocardial borders. The left  ventricular internal cavity size was moderately dilated left ventricle. There is no left ventricular hypertrophy. Left ventricular diastolic parameters are indeterminate. Diffuse hypokinesis worse in the apex and septum.  Right Ventricle: The right ventricular size is normal. No increase in right ventricular wall thickness. Global RV systolic function is has normal systolic function.  Left Atrium: Left atrial size was moderately dilated.  Right Atrium: Right atrial size was not well visualized  Pericardium: There is no evidence of pericardial effusion.  Mitral Valve: The mitral valve is degenerative in appearance. There is severe thickening of the mitral valve leaflet(s). There is severe calcification of the mitral valve leaflet(s). Moderate mitral annular calcification. Trace mitral valve  regurgitation.  Tricuspid Valve: The tricuspid valve is normal in structure. Tricuspid valve regurgitation is mild.  Aortic Valve: The aortic valve is tricuspid. Aortic valve regurgitation is not visualized. Severe  aortic stenosis is present. Aortic valve mean gradient measures 35.0 mmHg. Aortic valve peak gradient measures 50.7 mmHg. Aortic valve area, by VTI measures  0.84 cm. Severely calcified with restricted leaflet motion Likely low flow severe AS DVI 0.20 mean gradient increased from 28 mmHg to 35 mmHg peak increased from 47 to 51 mmHg since January.  Pulmonic Valve: The pulmonic valve was grossly normal. Pulmonic valve regurgitation is mild.  Aorta: The aortic root is normal in size and structure.  IAS/Shunts: The interatrial septum was not well visualized.     LEFT VENTRICLE PLAX 2D LVIDd:         6.00 cm LVIDs:         4.90 cm LV PW:         1.30 cm LV IVS:        1.10 cm LVOT diam:     2.30 cm LV SV:         67 ml LV  SV Index:   21.73 LVOT Area:     4.15 cm   LV Volumes (MOD) LV area d, A4C:    43.30 cm LV area s, A4C:    28.80 cm LV major d, A4C:   9.18 cm LV major s, A4C:   8.16 cm LV vol d, MOD A4C: 165.0 ml LV vol s, MOD A4C: 83.2 ml LV SV MOD A4C:     165.0 ml  RIGHT VENTRICLE RV S prime:     9.68 cm/s TAPSE (M-mode): 2.0 cm  LEFT ATRIUM             Index       RIGHT ATRIUM           Index LA diam:        4.90 cm 1.74 cm/m  RA Area:     18.50 cm LA Vol (A2C):   89.0 ml 31.65 ml/m RA Volume:   48.90 ml  17.39 ml/m LA Vol (A4C):   80.0 ml 28.45 ml/m LA Biplane Vol: 87.7 ml 31.19 ml/m  AORTIC VALVE AV Area (Vmax):    0.90 cm AV Area (Vmean):   0.83 cm AV Area (VTI):     0.84 cm AV Vmax:           356.00 cm/s AV Vmean:          277.000 cm/s AV VTI:            0.731 m AV Peak Grad:      50.7 mmHg AV Mean Grad:      35.0 mmHg LVOT Vmax:         77.50 cm/s LVOT Vmean:        55.600 cm/s LVOT VTI:          0.148 m LVOT/AV VTI ratio: 0.20   AORTA Ao Root diam: 3.60 cm    SHUNTS Systemic VTI:  0.15 m Systemic Diam: 2.30 cm    Charlton Haws MD Electronically signed by Charlton Haws MD Signature Date/Time: 06/05/2019/11:58:16 AM      RIGHT/LEFT HEART CATH AND CORONARY ANGIOGRAPHY  Conclusion  Conclusions: 1. Mild to moderate, non-obstructive CAD. 2. Mildly to moderately elevated left heart filling pressures. 3. Moderately elevated right heart and pulmonary artery pressures.  Prominent RA tracing V-waves raise possibility of significant tricuspid regurgitation. 4. Low normal Fick cardiac output/index. 5. Moderate aortic stenosis.  Recommendations: 1. Continue diuresis. 2. Further workup and management of aortic stenosis per structural heart team. 3. Medical therapy to prevent progression of coronary artery disease.  Yvonne Kendall, MD [facility] Pager: [phone]   Recommendations  Antiplatelet/Anticoag Recommend Aspirin  daily for moderate CAD.  Indications  Aortic valve stenosis, etiology of cardiac valve disease unspecified [I35.0 (ICD-10-CM)]  Acute on chronic heart failure with preserved ejection fraction (HFpEF) (HCC) [I50.33 (ICD-10-CM)]  Procedural Details  Technical Details Indication: 68 y.o. year-old man with history of HTN, HLD, DM, obesity, aortic stenosis and sleep apnea, admitted with shortness of breath and chest pain.  Echocardiogram showed interval decline in LVEF and moderate to severe aortic stenosis.  GFR: >60 ml/min  Procedure: The risks, benefits, complications, treatment options, and expected outcomes were discussed with the patient. The patient and/or family concurred with the proposed plan, giving informed consent. The patient was brought to the cath lab after IV hydration was begun and oral premedication was given. The patient was further sedated with Versed and Fentanyl. The right wrist was assessed with a modified Allens test which was  normal. The right wrist and elbow were prepped and draped in a sterile fashion. 1% lidocaine was used for local anesthesia. Ultrasound was used to evaluate the right basilic vein. It was patent.  An ultrasound image was saved in the  permanent record. A micropuncture needle was used to access the right basilic vein under ultrasound guidance. A 7F slender Glidesheath was inserted using modified Seldinger technique. Right heart catheterization was performed by advancing a 7F balloon-tipped catheter through the right heart chambers into the pulmonary capillary wedge position. Pressure measurements and oxygen saturations were obtained.  Ultrasound was used to evaluate the right radial artery. It was patent.  An ultrasound image was saved in the permanent record. A micropuncture needle was used to access the right radial artery under ultrasound guidance.  Using the modified Seldinger access technique, a 37F slender Glidesheath was placed in the right radial artery. 3 mg Verapamil was given through the sheath. Heparin 5,000 units were administered. Selective coronary angiography was performed using 7F JL3.5 and JR4 catheters to engage the left and right coronary arteries, respectively. Left heart catheterization was performed using a 7F JR4 catheter and straight wire.  The JR4 catheter was exchanged for a 37F dual lumen pigtail catheter over an exchange length J wire to allow for simultaneous measurement of LV and aortic pressures. Left ventriculogram was not performed.  At the end of the procedure, the radial artery sheath was removed and a TR band applied to achieve patent hemostasis. The basilic vein sheath was removed and hemostasis achieved with manual compression. There were no immediate complications. The patient was taken to the recovery area in stable condition.  Contrast used: 70 mL Fluoroscopy time: 7.3 min Radiation dose: 922 mGy Estimated blood loss <50 mL.   During this procedure no sedation was administered.   Complications documented before study signed (08/14/2018 6:57 PM)   No complications were associated with this study.  Documented by Yvonne Kendall, MD - 08/14/2018 6:53 PM    Coronary  Findings  Diagnostic Dominance: Right Left Main  Vessel is large.  Left Anterior Descending  Vessel is large.  Prox LAD lesion 25% stenosed  Prox LAD lesion is 25% stenosed.  First Diagonal Branch  Vessel is large in size.  Ost 1st Diag lesion 35% stenosed  Ost 1st Diag lesion is 35% stenosed.  Left Circumflex  Vessel is large. Vessel is angiographically normal.  First Obtuse Marginal Branch  Vessel is large in size.  Second Obtuse Marginal Branch  Vessel is small in size.  Right Coronary Artery  Vessel is large. The vessel exhibits minimal luminal irregularities.  Intervention  No interventions have been documented. Right Heart  Right Heart Pressures RA (mean): 15 mmHg with prominent V-waves RV (S/EDP): 46/15 mmHg PA (S/D, mean): 44/30 (35) mmHg PCWP (mean): 25 mmHg  Ao sat: 96% PA sat: 65%  Fick CO: 7.2 L/min Fick CI: 2.5 L/min/m^2  Left Heart  Left Ventricle LV end diastolic pressure is mildly elevated. LVEDP 20-25 mmHg.  Aortic Valve There is moderate aortic valve stenosis. Mean gradient: 23 mmHg. Peak-to-peak gradient: 30 mmHg. Valve area: 1.4 cm^2.  Coronary Diagrams  Diagnostic Dominance: Right  Intervention  Implants   No implant documentation for this case.  Syngo Images  Show images for CARDIAC CATHETERIZATION  Images on Long Term Storage  Show images for Jachin, Coury to Procedure Log  Procedure Log    Hemo Data   Most Recent Value  Fick Cardiac Output 7.19  L/min  Fick Cardiac Output Index 2.52 (L/min)/BSA  Aortic Mean Gradient 23.4 mmHg  Aortic Peak Gradient 30 mmHg  Aortic Valve Area 1.41  Aortic Value Area Index 0.5 cm2/BSA  RA A Wave 12 mmHg  RA V Wave 25 mmHg  RA Mean 14 mmHg  RV Systolic Pressure 39 mmHg  RV Diastolic Pressure 6 mmHg  RV EDP 10 mmHg  PA Systolic Pressure 32 mmHg  PA Diastolic Pressure 16 mmHg  PA Mean 23 mmHg  PW A Wave 22 mmHg  PW V Wave 19 mmHg  PW Mean 19 mmHg  AO Systolic Pressure 104  mmHg  AO Diastolic Pressure 70 mmHg  AO Mean 85 mmHg  LV Systolic Pressure 134 mmHg  LV Diastolic Pressure 13 mmHg  LV EDP 18 mmHg  AOp Systolic Pressure 104 mmHg  AOp Diastolic Pressure 68 mmHg  AOp Mean Pressure 84 mmHg  LVp Systolic Pressure 135 mmHg  LVp Diastolic Pressure 14 mmHg  LVp EDP Pressure 18 mmHg  QP/QS 1  TPVR Index 9.11 HRUI  TSVR Index 33.3 HRUI  PVR SVR Ratio 0.06  TPVR/TSVR Ratio 0.27     Cardiac TAVR CT  TECHNIQUE: The patient was scanned on a [employer]. A 120 kV retrospective scan was triggered in the descending thoracic aorta at 111 HU's. Gantry rotation speed was 250 msecs and collimation was .6 mm. No beta blockade or nitro were given. The 3D data set was reconstructed in 5% intervals of the R-R cycle. Systolic and diastolic phases were analyzed on a dedicated work station using MPR, MIP and VRT modes. The patient received 80 cc of contrast.  FINDINGS: Aortic Valve: Trileaflet, with severely calcified and thickened leaflets and severely restricted leaflet opening.  Aorta: Normal size with minimal atherosclerotic plaque and calcifications.  Sinotubular Junction: 32 x 31 mm  Ascending Thoracic Aorta: 38 x 37 mm  Aortic Arch: 32 x 31 mm  Descending Thoracic Aorta: 28 x 26 mm  Sinus of Valsalva Measurements:  Non-coronary: 35 mm  Right -coronary: 34 mm  Left -coronary: 35 mm  Coronary Artery Height above Annulus:  Left Main: 15 mm  Right Coronary: 17 mm  Virtual Basal Annulus Measurements:  Maximum/Minimum Diameter: 31.5 x 28.4 mm  Mean Diameter: 29.8 mm  Perimeter: 94.5 mm  Area: 696 mm2  Optimum Fluoroscopic Angle for Delivery: RAO 8 CRA 8.  IMPRESSION: 1. Trileaflet, with severely calcified and thickened leaflets and severely restricted leaflet opening. Aortic valve calcium score is 3437 consistent with severe aortic stenosis. Patient's scan quality is affected by patient's size,  annular size might be overestimated and appears too large for a 29 mm Edwards-SAPIEN 3 valve. Given patient's size, this patient might benefit from a 34 mm Evolut R CoreValve.  2. Sufficient coronary to annulus distance.  3. Optimum Fluoroscopic Angle for Delivery: RAO 8 CRA 8.  4. No thrombus in the left atrial appendage.  5. Mildly dilated pulmonary artery measuring 33 mm.  Electronically Signed: By: Tobias Alexander On: 06/09/2019 15:19   CT ANGIOGRAPHY CHEST, ABDOMEN AND PELVIS  TECHNIQUE: Multidetector CT imaging through the chest, abdomen and pelvis was performed using the standard protocol during bolus administration of intravenous contrast. Multiplanar reconstructed images and MIPs were obtained and reviewed to evaluate the vascular anatomy.  CONTRAST:  OMNIPAQUE IOHEXOL 350 MG/ML SOLN  COMPARISON:  No priors.  FINDINGS: CTA CHEST FINDINGS  Cardiovascular: Heart size is enlarged. There is no significant pericardial fluid, thickening or pericardial calcification. There is  aortic atherosclerosis, as well as atherosclerosis of the great vessels of the mediastinum and the coronary arteries, including calcified atherosclerotic plaque in the left main, left anterior descending, left circumflex and right coronary arteries. Severe thickening calcification of the aortic valve. Moderate calcifications of the mitral annulus.  Mediastinum/Lymph Nodes: Multiple prominent borderline enlarged and mildly enlarged mediastinal lymph nodes, largest of which measures 1.5 cm in short axis in the right paratracheal nodal station. No hilar lymphadenopathy. Patulous distal esophagus. No axillary lymphadenopathy.  Lungs/Pleura: Patchy multifocal areas of peribronchovascular ground-glass attenuation throughout the right lung with some peribronchovascular micronodularity, largest of which is in the right lower lobe measuring 8 x 5 mm (mean diameter of 6.5 mm).  No other larger more suspicious appearing pulmonary nodules or masses are noted. No pleural effusions.  Musculoskeletal/Soft Tissues: There are no aggressive appearing lytic or blastic lesions noted in the visualized portions of the skeleton.  CTA ABDOMEN AND PELVIS FINDINGS  Hepatobiliary: No suspicious cystic or solid hepatic lesions. No intra or extrahepatic biliary ductal dilatation. Gallbladder is normal in appearance.  Pancreas: No pancreatic mass. No pancreatic ductal dilatation. No pancreatic or peripancreatic fluid collections or inflammatory changes.  Spleen: Unremarkable.  Adrenals/Urinary Tract: 2.1 cm intermediate attenuation (38 HU) lesion (axial image 136 of series 15) in the posterior aspect of the interpolar region of the right kidney, incompletely characterized. Left kidney and bilateral adrenal glands are normal in appearance. No hydroureteronephrosis. Urinary bladder is nearly decompressed, with a Foley balloon catheter in place, and small amount of gas non dependently in the lumen of the urinary bladder which is presumably iatrogenic.  Stomach/Bowel: LapBand in place around the proximal stomach. Stomach is otherwise unremarkable in appearance. No pathologic dilatation of small bowel or colon. Normal appendix.  Vascular/Lymphatic: Mild aortic atherosclerosis, without evidence of aneurysm or dissection in the abdominal or pelvic vasculature. Vascular findings and measurements pertinent to potential TAVR procedure, as detailed below. No lymphadenopathy noted in the abdomen or pelvis.  Reproductive: Prostate gland and seminal vesicles are unremarkable in appearance.  Other: No significant volume of ascites.  No pneumoperitoneum.  Musculoskeletal: There are no aggressive appearing lytic or blastic lesions noted in the visualized portions of the skeleton.  VASCULAR MEASUREMENTS PERTINENT TO TAVR:  AORTA:  Minimal Aortic Diameter-18 x 18  mm  Severity of Aortic Calcification-mild  RIGHT PELVIS:  Right Common Iliac Artery -  Minimal Diameter-13.1 x 11.5 mm  Tortuosity-mild  Calcification-minimal  Right External Iliac Artery -  Minimal Diameter-8.4 x 7.9 mm  Tortuosity-moderate to severe  Calcification-none  Right Common Femoral Artery -  Minimal Diameter-10.0 x 9.2 mm  Tortuosity-mild  Calcification - none  LEFT PELVIS:  Left Common Iliac Artery -  Minimal Diameter-13.4 x 11.6 mm  Tortuosity-mild  Calcification-minimal  Left External Iliac Artery -  Minimal Diameter-8.3 x 8.6 mm  Tortuosity-moderate  Calcification - none  Left Common Femoral Artery -  Minimal Diameter-7.8 x 7.7 mm  Tortuosity-mild  Calcification - none  Review of the MIP images confirms the above findings.  IMPRESSION: 1. Vascular findings and measurements pertinent to potential TAVR procedure, as detailed above. 2. Severe thickening calcification of the aortic valve, compatible with the reported clinical history of severe aortic stenosis. 3. Aortic atherosclerosis, in addition to left main and 3 vessel coronary artery disease. 4. Cardiomegaly. 5. Multiple borderline enlarged and mildly enlarged mediastinal lymph nodes measuring up to 1.5 cm in the right paratracheal nodal station. This is nonspecific, but favored to be reactive given the findings  in the right lung which likely reflect mild multilobar bronchopneumonia. 6. Multiple small pulmonary nodules in the right lung, largest of which has a mean diameter of 6.5 mm in the right lower lobe. Non-contrast chest CT at 3-6 months is recommended. If the nodules are stable at time of repeat CT, then future CT at 18-24 months (from today's scan) is considered optional for low-risk patients, but is recommended for high-risk patients. This recommendation follows the consensus statement: Guidelines for Management of Incidental Pulmonary  Nodules Detected on CT Images: From the Fleischner Society 2017; Radiology 2017; 284:228-243. 7. 2.1 cm intermediate attenuation lesion in the right kidney, incompletely characterized on today's examination. The possibility of a small renal neoplasm is not excluded, and further characterization with nonemergent abdominal MRI with and without IV gadolinium is recommended in the near future.   Electronically Signed   By: Trudie Reed M.D.   On: 06/09/2019 15:35     Impression:  Patient has stage D severe symptomatic aortic stenosis.  He suffers from chronic symptoms of exertional shortness of breath, fatigue, and lower extremity edema consistent with chronic combined systolic and diastolic congestive heart failure, New York Heart Association functional class IIIb.  Over the past year he has had 2 hospitalizations for acute exacerbations with class IV symptoms and pulmonary edema, most recently just 2 weeks ago.  I have personally reviewed the patient's most recent transthoracic echocardiogram, diagnostic cardiac catheterization, and CT angiograms.  Echocardiogram confirms the presence of severe aortic stenosis.  The aortic valve is trileaflet with severe thickening, calcification, and restricted leaflet mobility involving all 3 leaflets of the aortic valve.  Peak velocity across aortic valve measured 3.6 m/s corresponding to mean transvalvular gradient estimated 35 mmHg in the setting of severe left ventricular systolic dysfunction with ejection fraction only 30 to 35%.  DVI was notably quite low at 0.20 and aortic valve area calculated only 0.84 cm.  Diagnostic cardiac catheterization performed January 2020 revealed moderate nonobstructive coronary artery disease.  I agree the patient would benefit from aortic valve replacement.  Risks associated with conventional surgery would be somewhat elevated because of the patient's extreme morbid obesity and numerous other comorbid medical problems.   Cardiac-gated CTA of the heart reveals anatomical characteristics consistent with aortic stenosis suitable for treatment by transcatheter aortic valve replacement without any significant complicating features.  The gated scan was of marginal quality and there is some question regarding the absolute size of the patient's aortic root.  However, I have personally reviewed the images and feel that the patient's valve can be treated using a 29 mm Edwards's Sapien 3 transcatheter heart valve without unusual risk.  CTA of the aorta and iliac vessels demonstrate what appears to be adequate pelvic vascular access to facilitate a transfemoral approach.  The patient does have right bundle branch block at baseline and would be at somewhat increased risk for need for permanent pacemaker placement.    Plan:  The patient and his wife were counseled at length regarding treatment alternatives for management of severe symptomatic aortic stenosis. Alternative approaches such as conventional aortic valve replacement, transcatheter aortic valve replacement, and continued medical therapy without intervention were compared and contrasted at length.  The risks associated with conventional surgical aortic valve replacement were discussed in detail, as were expectations for post-operative convalescence, and why I would be reluctant to consider this patient a candidate for conventional surgery.  Issues specific to transcatheter aortic valve replacement were discussed including questions about long term valve durability,  the potential for paravalvular leak, possible increased risk of need for permanent pacemaker placement, and other technical complications related to the procedure itself.  Long-term prognosis with medical therapy was discussed. This discussion was placed in the context of the patient's own specific clinical presentation and past medical history.  All of their questions have been addressed.  The patient desires to proceed  with transcatheter aortic valve replacement on July 06, 2019.  Because of the patient's ongoing symptoms of urinary frequency and urgency and his history of urinary retention during his recent hospitalization, we will recheck a urinalysis and urine culture.  Following the decision to proceed with transcatheter aortic valve replacement, a discussion has been held regarding what types of management strategies would be attempted intraoperatively in the event of life-threatening complications, including whether or not the patient would be considered a candidate for the use of cardiopulmonary bypass and/or conversion to open sternotomy for attempted surgical intervention.  The patient specifically requests that should a potentially life-threatening complication develop we would attempt emergency median sternotomy and/or other aggressive surgical procedures.  The patient has been advised of a variety of complications that might develop including but not limited to risks of death, stroke, paravalvular leak, aortic dissection or other major vascular complications, aortic annulus rupture, device embolization, cardiac rupture or perforation, mitral regurgitation, acute myocardial infarction, arrhythmia, heart block or bradycardia requiring permanent pacemaker placement, congestive heart failure, respiratory failure, renal failure, pneumonia, infection, other late complications related to structural valve deterioration or migration, or other complications that might ultimately cause a temporary or permanent loss of functional independence or other long term morbidity.  The patient provides full informed consent for the procedure as described and all questions were answered.    I spent in excess of 90 minutes during the conduct of this office consultation and >50% of this time involved direct face-to-face encounter with the patient for counseling and/or coordination of their care.     Valentina Gu. Roxy Manns,  MD 06/18/2019 11:35 AM

## 2019-06-18 NOTE — Patient Instructions (Addendum)
   Continue taking all current medications without change through the day before surgery.  Make sure to bring all of your medications with you when you come for your Pre-Admission Testing appointment at [facility] Short-Stay Department.  Have nothing to eat or drink after midnight the night before surgery.  On the morning of surgery take only Protonix, Lexapro, and Wellbutrin with a sip of water.  On the morning of surgery take 1/2 your usual dose of slow-acting insulin (NPH) and do not take any fast-acting (R) insulin  At your upcoming appointment for Pre-Admission Testing at the [facility] Short-Stay Department you will be asked to sign permission forms for your upcoming surgery.    By definition your signature on these permission forms implies that you and/or your designee provide full informed consent for your upcoming planned surgical procedure(s), that alternative treatment options have been discussed, that you understand and accept any and all potential risks, and that you have some understanding of what to expect for your post-operative convalescence.  For any major cardiac surgical procedure operative risks include but are not limited to at least a small risk of death, stroke or other neurologic complication, myocardial infarction, congestive heart failure, respiratory failure, renal failure, bleeding requiring blood transfusion and/or reexploration, irregular heart rhythm, heart block or bradycardia requiring permanent pacemaker, pneumonia, pericardial effusion, pleural effusion, wound infection, pulmonary embolus or other thromboembolic complication, chronic pain, or other delayed complications related to the specific procedure(s) performed.  Additional risks specific to transcatheter aortic valve replacement also include but are not limited to risk of paravalvular leak, valve embolization, valve thrombosis, aortic dissection, aortic rupture, ventricular  perforation, pericardial tamponade, rupture and/or dissection of the abdominal aorta or its branches, and/or injury or occlusion of the arteries going to one or both legs.  Please schedule a follow-up appointment in our office prior to your surgery if you have any unresolved questions about your planned surgical procedure and/or the associated risks.

## 2019-06-20 LAB — URINALYSIS
Bilirubin Urine: NEGATIVE
Glucose, UA: NEGATIVE
Ketones, ur: NEGATIVE
Nitrite: NEGATIVE
Specific Gravity, Urine: 1.017 (ref 1.001–1.03)
pH: >=8.5 — AB (ref 5.0–8.0)

## 2019-06-20 LAB — URINE CULTURE
MICRO NUMBER:: 1125350
SPECIMEN QUALITY:: ADEQUATE

## 2019-06-21 ENCOUNTER — Encounter: Payer: Medicare Other | Admitting: Thoracic Surgery (Cardiothoracic Vascular Surgery)

## 2019-06-21 ENCOUNTER — Ambulatory Visit (INDEPENDENT_AMBULATORY_CARE_PROVIDER_SITE_OTHER): Payer: Medicare Other | Admitting: *Deleted

## 2019-06-21 ENCOUNTER — Other Ambulatory Visit: Payer: Self-pay

## 2019-06-21 DIAGNOSIS — N390 Urinary tract infection, site not specified: Secondary | ICD-10-CM

## 2019-06-21 DIAGNOSIS — R55 Syncope and collapse: Secondary | ICD-10-CM | POA: Diagnosis not present

## 2019-06-21 LAB — CUP PACEART REMOTE DEVICE CHECK
Date Time Interrogation Session: 20201122214310
Implantable Pulse Generator Implant Date: 20190205

## 2019-06-21 MED ORDER — CIPROFLOXACIN HCL 500 MG PO TABS: 500.0000 mg | ORAL_TABLET | Freq: Two times a day (BID) | ORAL | 0 refills | Status: AC

## 2019-06-22 ENCOUNTER — Other Ambulatory Visit: Payer: Self-pay

## 2019-06-22 DIAGNOSIS — Z79899 Other long term (current) drug therapy: Secondary | ICD-10-CM | POA: Diagnosis not present

## 2019-06-22 DIAGNOSIS — I10 Essential (primary) hypertension: Secondary | ICD-10-CM | POA: Diagnosis not present

## 2019-06-22 DIAGNOSIS — I5022 Chronic systolic (congestive) heart failure: Secondary | ICD-10-CM | POA: Diagnosis not present

## 2019-06-22 DIAGNOSIS — I350 Nonrheumatic aortic (valve) stenosis: Secondary | ICD-10-CM

## 2019-06-23 ENCOUNTER — Other Ambulatory Visit: Payer: Self-pay

## 2019-06-23 DIAGNOSIS — I350 Nonrheumatic aortic (valve) stenosis: Secondary | ICD-10-CM

## 2019-06-28 DIAGNOSIS — N390 Urinary tract infection, site not specified: Secondary | ICD-10-CM | POA: Diagnosis not present

## 2019-06-28 DIAGNOSIS — L03032 Cellulitis of left toe: Secondary | ICD-10-CM | POA: Diagnosis not present

## 2019-06-28 DIAGNOSIS — M79671 Pain in right foot: Secondary | ICD-10-CM | POA: Diagnosis not present

## 2019-06-29 ENCOUNTER — Telehealth: Payer: Self-pay | Admitting: Physician Assistant

## 2019-06-29 LAB — URINE CULTURE: Organism ID, Bacteria: NO GROWTH

## 2019-06-29 NOTE — Telephone Encounter (Signed)
  [facility] AND [facility]ASCULAR CENTER   MULTIDISCIPLINARY HEART VALVE TEAM  Patient's wife called our office today to report a 6 lb weight gain. He has had issues with urinary retention and now on Flomax. He was not able to urinate well yesterday. He was scheduled for a repeat UA with culture at [place] yesterday and was just barely able to get a specimen. He did not urinate again after that until this AM. He takes lasix 60mg  daily and was taken off his potassium supplementation last week by his primary medical MD after labs showed hyperkalemia. I have asked him to take an extra 60mg  of lasix today as well as a potassium tablet. They will continue to watch his weight closely.    Angelena Form PA-C  [facility]

## 2019-06-30 NOTE — Telephone Encounter (Signed)
  [facility] VASCULAR CENTER   MULTIDISCIPLINARY HEART VALVE TEAM  Pt's wife called to give update on pt's weight.  The pt was able to urinate 2 times during the night and this morning his weight is down 2 lbs.  At this time the pt is up a total of 4 lbs.  I spoke with Nell Range PA-C and she advised that the pt again take an extra 60mg  of lasix today with a potassium tablet.  The pt's wife will continue to monitor weight and call with any additional questions or concerns.  I also advised pt's wife that the Urine Culture resulted and showed no growth.  Yesterday when we spoke with [facility] the UA was pending but at this time this remains pending.  The pt will have another UA checked at PAT.

## 2019-07-01 NOTE — Pre-Procedure Instructions (Signed)
[facility]  07/01/2019      [place], [place] [address] Phone: [phone] Fax: [phone]    Your procedure is scheduled on 07/06/19.  Report to [facility] Admitting at 530 A.M.  Call this number if you have problems the morning of surgery:  [phone]   Remember:  Do not eat or drink after midnight.     Take these medicines the morning of surgery with A SIP OF WATER ---PROTONIX,LEXAPRO,WELBUTRIN.    Do not wear jewelry, make-up or nail polish.  Do not wear lotions, powders, or perfumes, or deodorant.  Do not shave 48 hours prior to surgery.  Men may shave face and neck.  Do not bring valuables to the hospital.   [facility] is not responsible for any belongings or valuables.  Contacts, dentures or bridgework may not be worn into surgery.  Leave your suitcase in the car.  After surgery it may be brought to your room.  For patients admitted to the hospital, discharge time will be determined by your treatment team.  Patients discharged the day of surgery will not be allowed to drive home.    Special instructions:  Do not take any aspirin,anti-inflammatories,vitamins,or herbal supplements 5-7 days prior to surgery.[place] - Preparing for Surgery  Before surgery, you can play an important role.  Because skin is not sterile, your skin needs to be as free of germs as possible.  You can reduce the number of germs on you skin by washing with CHG (chlorahexidine gluconate) soap before surgery.  CHG is an antiseptic cleaner which kills germs and bonds with the skin to continue killing germs even after washing.  Oral Hygiene is also important in reducing the risk of infection.  Remember to brush your teeth with your regular toothpaste the morning of surgery.  Please DO NOT use if you have an allergy to CHG or antibacterial soaps.  If your skin becomes reddened/irritated stop using the CHG and  inform your nurse when you arrive at Short Stay.  Do not shave (including legs and underarms) for at least 48 hours prior to the first CHG shower.  You may shave your face.  Please follow these instructions carefully:   1.  Shower with CHG Soap the night before surgery and the morning of Surgery.  2.  If you choose to wash your hair, wash your hair first as usual with your normal shampoo.  3.  After you shampoo, rinse your hair and body thoroughly to remove the shampoo. 4.  Use CHG as you would any other liquid soap.  You can apply chg directly to the skin and wash gently with a      scrungie or washcloth.           5.  Apply the CHG Soap to your body ONLY FROM THE NECK DOWN.   Do not use on open wounds or open sores. Avoid contact with your eyes, ears, mouth and genitals (private parts).  Wash genitals (private parts) with your normal soap.  6.  Wash thoroughly, paying special attention to the area where your surgery will be performed.  7.  Thoroughly rinse your body with warm water from the neck down.  8.  DO NOT shower/wash with your normal soap after using and rinsing off the CHG Soap.  9.  Pat yourself dry with a clean towel.  10.  Wear clean pajamas.            11.  Place clean sheets on your bed the night of your first shower and do not sleep with pets.  Day of Surgery  Do not apply any lotions/deoderants the morning of surgery.   Please wear clean clothes to the hospital/surgery center. Remember to brush your teeth with toothpaste.     How to Manage Your Diabetes Before and After Surgery  Why is it important to control my blood sugar before and after surgery? . Improving blood sugar levels before and after surgery helps healing and can limit problems. . A way of improving blood sugar control is eating a healthy diet by: o  Eating less sugar and carbohydrates o  Increasing activity/exercise o  Talking with your doctor about reaching your blood sugar goals . High  blood sugars (greater than 180 mg/dL) can raise your risk of infections and slow your recovery, so you will need to focus on controlling your diabetes during the weeks before surgery. . Make sure that the doctor who takes care of your diabetes knows about your planned surgery including the date and location.  How do I manage my blood sugar before surgery? . Check your blood sugar at least 4 times a day, starting 2 days before surgery, to make sure that the level is not too high or low. o Check your blood sugar the morning of your surgery when you wake up and every 2 hours until you get to the Short Stay unit. . If your blood sugar is less than 70 mg/dL, you will need to treat for low blood sugar: o Do not take insulin. o Treat a low blood sugar (less than 70 mg/dL) with  cup of clear juice (cranberry or apple), 4 glucose tablets, OR glucose gel. Recheck blood sugar in 15 minutes after treatment (to make sure it is greater than 70 mg/dL). If your blood sugar is not greater than 70 mg/dL on recheck, call [phone] o  for further instructions. . Report your blood sugar to the short stay nurse when you get to Short Stay.  . If you are admitted to the hospital after surgery: o Your blood sugar will be checked by the staff and you will probably be given insulin after surgery (instead of oral diabetes medicines) to make sure you have good blood sugar levels. o The goal for blood sugar control after surgery is 80-180 mg/dL.              WHAT DO I DO ABOUT MY DIABETES MEDICATION?   Marland Kitchen Do not take oral diabetes medicines (pills) the morning of surgery.  . THE NIGHT BEFORE SURGERY, take ___________ units of ___________insulin.       . THE MORNING OF SURGERY, take _____________ units of __________insulin.  . The day of surgery, do not take other diabetes injectables, including Byetta (exenatide), Bydureon (exenatide ER), Victoza (liraglutide), or Trulicity (dulaglutide).  . If your CBG  is greater than 220 mg/dL, you may take  of your sliding scale (correction) dose of insulin.  Other Instructions:          Patient Signature:  Date:   Nurse Signature:  Date:   Reviewed and Endorsed by [facility] Patient Education Committee, August 2015  Please read over the following fact sheets that you were given. MRSA Information

## 2019-07-02 ENCOUNTER — Other Ambulatory Visit (HOSPITAL_COMMUNITY)
Admission: RE | Admit: 2019-07-02 | Discharge: 2019-07-02 | Disposition: A | Discharge status: Home / Self Care | Payer: Medicare Other | Source: Ambulatory Visit | Attending: Cardiovascular Disease | Admitting: Cardiovascular Disease

## 2019-07-02 ENCOUNTER — Other Ambulatory Visit: Payer: Self-pay

## 2019-07-02 ENCOUNTER — Encounter (HOSPITAL_COMMUNITY)
Admission: RE | Admit: 2019-07-02 | Discharge: 2019-07-02 | Disposition: A | Discharge status: Home / Self Care | Payer: Medicare Other | Source: Ambulatory Visit | Attending: Cardiovascular Disease | Admitting: Cardiovascular Disease

## 2019-07-02 ENCOUNTER — Ambulatory Visit (HOSPITAL_COMMUNITY)
Admission: RE | Admit: 2019-07-02 | Discharge: 2019-07-02 | Disposition: A | Discharge status: Home / Self Care | Payer: Medicare Other | Source: Ambulatory Visit | Attending: Cardiovascular Disease | Admitting: Cardiovascular Disease

## 2019-07-02 DIAGNOSIS — I350 Nonrheumatic aortic (valve) stenosis: Secondary | ICD-10-CM

## 2019-07-02 DIAGNOSIS — Z20828 Contact with and (suspected) exposure to other viral communicable diseases: Secondary | ICD-10-CM | POA: Insufficient documentation

## 2019-07-02 DIAGNOSIS — Z01812 Encounter for preprocedural laboratory examination: Secondary | ICD-10-CM | POA: Diagnosis not present

## 2019-07-02 DIAGNOSIS — Z952 Presence of prosthetic heart valve: Secondary | ICD-10-CM | POA: Diagnosis not present

## 2019-07-02 LAB — URINALYSIS, ROUTINE W REFLEX MICROSCOPIC
Bilirubin Urine: NEGATIVE
Glucose, UA: NEGATIVE mg/dL
Hgb urine dipstick: NEGATIVE
Ketones, ur: NEGATIVE mg/dL
Nitrite: NEGATIVE
Protein, ur: NEGATIVE mg/dL
Specific Gravity, Urine: 1.013 (ref 1.005–1.030)
pH: 5 (ref 5.0–8.0)

## 2019-07-02 LAB — COMPREHENSIVE METABOLIC PANEL
ALT: 22 U/L (ref 0–44)
AST: 24 U/L (ref 15–41)
Albumin: 3.2 g/dL — ABNORMAL LOW (ref 3.5–5.0)
Alkaline Phosphatase: 99 U/L (ref 38–126)
Anion gap: 14 (ref 5–15)
BUN: 11 mg/dL (ref 8–23)
CO2: 23 mmol/L (ref 22–32)
Calcium: 8.7 mg/dL — ABNORMAL LOW (ref 8.9–10.3)
Chloride: 101 mmol/L (ref 98–111)
Creatinine, Ser: 0.96 mg/dL (ref 0.61–1.24)
GFR calc Af Amer: >60 mL/min (ref >60)
GFR calc non Af Amer: >60 mL/min (ref >60)
Glucose, Bld: 211 mg/dL — ABNORMAL HIGH (ref 70–99)
Potassium: 4.1 mmol/L (ref 3.5–5.1)
Sodium: 138 mmol/L (ref 135–145)
Total Bilirubin: 1.1 mg/dL (ref 0.3–1.2)
Total Protein: 7.1 g/dL (ref 6.5–8.1)

## 2019-07-02 LAB — CBC
HCT: 45.4 % (ref 39.0–52.0)
Hemoglobin: 14.5 g/dL (ref 13.0–17.0)
MCH: 28.7 pg (ref 26.0–34.0)
MCHC: 31.9 g/dL (ref 30.0–36.0)
MCV: 89.9 fL (ref 80.0–100.0)
Platelets: 276 10*3/uL (ref 150–400)
RBC: 5.05 MIL/uL (ref 4.22–5.81)
RDW: 15 % (ref 11.5–15.5)
WBC: 9.1 10*3/uL (ref 4.0–10.5)
nRBC: 0 % (ref 0.0–0.2)

## 2019-07-02 LAB — PROTIME-INR
INR: 1.1 (ref 0.8–1.2)
Prothrombin Time: 13.9 seconds (ref 11.4–15.2)

## 2019-07-02 LAB — BLOOD GAS, ARTERIAL
Acid-Base Excess: 3.5 mmol/L — ABNORMAL HIGH (ref 0.0–2.0)
Bicarbonate: 28 mmol/L (ref 20.0–28.0)
Drawn by: 421801
FIO2: 21
O2 Saturation: 96.4 %
Patient temperature: 37
pCO2 arterial: 46.7 mmHg (ref 32.0–48.0)
pH, Arterial: 7.396 (ref 7.350–7.450)
pO2, Arterial: 91.5 mmHg (ref 83.0–108.0)

## 2019-07-02 LAB — SURGICAL PCR SCREEN
MRSA, PCR: POSITIVE — AB
Staphylococcus aureus: POSITIVE — AB

## 2019-07-02 LAB — TYPE AND SCREEN
ABO/RH(D): A POS
Antibody Screen: NEGATIVE

## 2019-07-02 LAB — HEMOGLOBIN A1C
Hgb A1c MFr Bld: 8 % — ABNORMAL HIGH (ref 4.8–5.6)
Mean Plasma Glucose: 182.9 mg/dL

## 2019-07-02 LAB — ABO/RH: ABO/RH(D): A POS

## 2019-07-02 LAB — APTT: aPTT: 32 seconds (ref 24–36)

## 2019-07-02 LAB — BRAIN NATRIURETIC PEPTIDE: B Natriuretic Peptide: 249.8 pg/mL — ABNORMAL HIGH (ref 0.0–100.0)

## 2019-07-02 LAB — GLUCOSE, CAPILLARY: Glucose-Capillary: 249 mg/dL — ABNORMAL HIGH (ref 70–99)

## 2019-07-02 NOTE — Progress Notes (Signed)
Message sent to Dr. Angelena Form regarding abnormal labs and MRSA/MSSA (+).  Mupirocin RX called into [employer] [facility] and patient notified.

## 2019-07-02 NOTE — Progress Notes (Signed)
PCP:  Dr. Asencion Noble Cardiologist:  Unsure  EKG:  06/14/19 CXR: 07/02/19 ECHO:  11/20 Stress Test: denies Cardiac Cath:  denies  Fasting Blood Sugar- 68-300 Checks Blood Sugar__3_ times a day  Anesthesia Review:  Yes, cardiac hx.  BG 314.  Bonney Leitz, PA came to PAT and looked at sore on right foot.  Stated she thought it was okay for surgery.  Took picture.   Covid testing 07/02/19  Patient denies shortness of breath, fever, cough, and chest pain at PAT appointment.  Patient verbalized understanding of instructions provided today at the PAT appointment.  Patient asked to review instructions at home and day of surgery.

## 2019-07-02 NOTE — Pre-Procedure Instructions (Signed)
Daniel Valenzuela [facility]  07/02/2019      [place], [place] [address] Phone: [phone] Fax: [phone]    Your procedure is scheduled on 07/06/19.  Report to [facility] Admitting at 530 A.M.  Call this number if you have problems the morning of surgery:  [phone]   Remember:  Do not eat or drink after midnight.     Take these medicines the morning of surgery with A SIP OF WATER ---PROTONIX,LEXAPRO,WELBUTRIN.    Do not wear jewelry.  Do not wear lotions, powders, colones, or deodorant.    Men may shave face and neck.  Do not bring valuables to the hospital.  [facility] is not responsible for any belongings or valuables.  Contacts, dentures or bridgework may not be worn into surgery.  Leave your suitcase in the car.  After surgery it may be brought to your room.  For patients admitted to the hospital, discharge time will be determined by your treatment team.  Patients discharged the day of surgery will not be allowed to drive home.    Special instructions:  Do not take any aspirin,anti-inflammatories,vitamins,or herbal supplements 5-7 days prior to surgery.[facility] - Preparing for Surgery  Before surgery, you can play an important role.  Because skin is not sterile, your skin needs to be as free of germs as possible.  You can reduce the number of germs on you skin by washing with CHG (chlorahexidine gluconate) soap before surgery.  CHG is an antiseptic cleaner which kills germs and bonds with the skin to continue killing germs even after washing.  Oral Hygiene is also important in reducing the risk of infection.  Remember to brush your teeth with your regular toothpaste the morning of surgery.  Please DO NOT use if you have an allergy to CHG or antibacterial soaps.  If your skin becomes reddened/irritated stop using the CHG and inform your nurse when you arrive at Short Stay.  Do not shave  (including legs and underarms) for at least 48 hours prior to the first CHG shower.  You may shave your face.  Please follow these instructions carefully:   1.  Shower with CHG Soap the night before surgery and the morning of Surgery.  2.  If you choose to wash your hair, wash your hair first as usual with your normal shampoo.  3.  After you shampoo, rinse your hair and body thoroughly to remove the shampoo. 4.  Use CHG as you would any other liquid soap.  You can apply chg directly to the skin and wash gently with a      scrungie or washcloth.           5.  Apply the CHG Soap to your body ONLY FROM THE NECK DOWN.   Do not use on open wounds or open sores. Avoid contact with your eyes, ears, mouth and genitals (private parts).  Wash genitals (private parts) with your normal soap.  6.  Wash thoroughly, paying special attention to the area where your surgery will be performed.  7.  Thoroughly rinse your body with warm water from the neck down.  8.  DO NOT shower/wash with your normal soap after using and rinsing off the CHG Soap.  9.  Pat yourself dry with a clean towel.            10.  Wear clean pajamas.  11.  Place clean sheets on your bed the night of your first shower and do not sleep with pets.  Day of Surgery  Do not apply any lotions/deoderants the morning of surgery.   Please wear clean clothes to the hospital/surgery center. Remember to brush your teeth with toothpaste.     How to Manage Your Diabetes Before and After Surgery  Why is it important to control my blood sugar before and after surgery? . Improving blood sugar levels before and after surgery helps healing and can limit problems. . A way of improving blood sugar control is eating a healthy diet by: o  Eating less sugar and carbohydrates o  Increasing activity/exercise o  Talking with your doctor about reaching your blood sugar goals . High blood sugars (greater than 180 mg/dL) can raise your risk of  infections and slow your recovery, so you will need to focus on controlling your diabetes during the weeks before surgery. . Make sure that the doctor who takes care of your diabetes knows about your planned surgery including the date and location.  How do I manage my blood sugar before surgery? . Check your blood sugar at least 4 times a day, starting 2 days before surgery, to make sure that the level is not too high or low. o Check your blood sugar the morning of your surgery when you wake up and every 2 hours until you get to the Short Stay unit. . If your blood sugar is less than 70 mg/dL, you will need to treat for low blood sugar: o Do not take insulin. o Treat a low blood sugar (less than 70 mg/dL) with  cup of clear juice (cranberry or apple), 4 glucose tablets, OR glucose gel. Recheck blood sugar in 15 minutes after treatment (to make sure it is greater than 70 mg/dL). If your blood sugar is not greater than 70 mg/dL on recheck, call [phone] o  for further instructions. . Report your blood sugar to the short stay nurse when you get to Short Stay.  . If you are admitted to the hospital after surgery: o Your blood sugar will be checked by the staff and you will probably be given insulin after surgery (instead of oral diabetes medicines) to make sure you have good blood sugar levels. o The goal for blood sugar control after surgery is 80-180 mg/dL.              WHAT DO I DO ABOUT MY DIABETES MEDICATION?   Marland Kitchen Do not take oral diabetes medicines (pills) the morning of surgery.  . THE NIGHT BEFORE SURGERY, take_50%_of usual dose of _Novolin N _insulin.       . THE MORNING OF SURGERY, take 50% of usual dose of_Novolin N_insulin.  . The day of surgery, do not take other diabetes injectables, including Byetta (exenatide), Bydureon (exenatide ER), Victoza (liraglutide), or Trulicity (dulaglutide).  . If your CBG is greater than 220 mg/dL, you may take  of your sliding  scale (correction) dose of insulin.  Other Instructions:          Patient Signature:  Date:   Nurse Signature:  Date:   Reviewed and Endorsed by [facility] Patient Education Committee, August 2015  Please read over the following fact sheets that you were given. MRSA Information

## 2019-07-03 LAB — NOVEL CORONAVIRUS, NAA (HOSP ORDER, SEND-OUT TO REF LAB; TAT 18-24 HRS): SARS-CoV-2, NAA: NOT DETECTED

## 2019-07-05 ENCOUNTER — Encounter (HOSPITAL_COMMUNITY): Payer: Self-pay | Admitting: Certified Registered Nurse Anesthetist

## 2019-07-05 MED ORDER — MAGNESIUM SULFATE 50 % IJ SOLN
40.0000 meq | INTRAMUSCULAR | Status: DC
  Filled 2019-07-05: qty 9.85

## 2019-07-05 MED ORDER — NOREPINEPHRINE 4 MG/250ML-% IV SOLN
0.0000 ug/min | INTRAVENOUS | Status: DC
  Filled 2019-07-05: qty 250

## 2019-07-05 MED ORDER — LEVOFLOXACIN IN D5W 500 MG/100ML IV SOLN
500.0000 mg | INTRAVENOUS | Status: AC
  Administered 2019-07-06: 500 mg via INTRAVENOUS
  Filled 2019-07-05: qty 100

## 2019-07-05 MED ORDER — DEXMEDETOMIDINE HCL IN NACL 400 MCG/100ML IV SOLN
0.1000 ug/kg/h | INTRAVENOUS | Status: AC
  Administered 2019-07-06: 09:00:00 via INTRAVENOUS
  Administered 2019-07-06: 1 ug/kg/h via INTRAVENOUS
  Filled 2019-07-05: qty 100

## 2019-07-05 MED ORDER — SODIUM CHLORIDE 0.9 % IV SOLN
INTRAVENOUS | Status: DC
  Filled 2019-07-05: qty 30

## 2019-07-05 MED ORDER — VANCOMYCIN HCL 10 G IV SOLR
1500.0000 mg | INTRAVENOUS | Status: AC
  Administered 2019-07-06: 1500 mg via INTRAVENOUS
  Filled 2019-07-05: qty 1500

## 2019-07-05 MED ORDER — POTASSIUM CHLORIDE 2 MEQ/ML IV SOLN
80.0000 meq | INTRAVENOUS | Status: DC
  Filled 2019-07-05: qty 40

## 2019-07-05 NOTE — H&P (Signed)
[address]       [place] [address]             [phone]      Cardiothoracic Surgery Admission History and Physical   Primary Cardiologist is Lewayne Bunting, MD  PCP is Carylon Perches, MD      Chief Complaint  Patient presents with   Aortic Stenosis       HPI:  Patient is a 68 year old morbidly obese male with history of aortic stenosis, hypertension, insulin-dependent type 2 diabetes mellitus with complications, obstructive sleep apnea on CPAP, chronic right bundle branch block and first-degree AV block, syncope, GE reflux disease, and chronic combined systolic and diastolic congestive heart failure who was referred for surgical consultation to discuss treatment options for management of severe symptomatic aortic stenosis.  Patient's cardiac history dates back to February 2019 when he was admitted following a syncopal episode. Etiology was eventually attributed to vasovagal symptoms and have not recurred since verapamil was discontinued, but the patient underwent loop recorder implantation because of underlying right bundle branch block with long first-degree AV block. Echocardiogram at that time revealed normal left ventricular systolic function with mild aortic stenosis. He has been followed intermittently ever since by Dr. Ladona Ridgel and has never had any evidence of syncope or significant advanced conduction system disease based upon loop recorder.  Patient was hospitalized in January 2020 with acute exacerbation of chronic congestive heart failure and massive volume overload. Echocardiogram performed at that time revealed significant left ventricular systolic dysfunction with ejection fraction estimated only 35 to 40%. There was felt to be moderate to severe aortic stenosis with mean transvalvular gradient estimated 28 mmHg and aortic valve area calculated 1.34 cm. The patient underwent diagnostic cardiac catheterization which revealed mild to moderate nonobstructive  coronary artery disease with moderate aortic stenosis based on mean transvalvular gradient measured 23 mmHg at catheterization and aortic valve area calculated 1.4 cm. The patient was recently readmitted to the hospital earlier this month with another acute exacerbation of chronic congestive heart failure and pulmonary edema. Follow-up echocardiogram revealed left ventricular ejection fraction down further to 30-35%. Despite this there was evidence of severe aortic stenosis with mean transvalvular gradient estimated 35 mmHg, DVI reported 0.20 and aortic valve area calculated only 0.83 cm. Patient was referred to the multidisciplinary heart valve team and has been evaluated previously by Dr. Clifton James. The patient also was seen in consultation by Dr. Robin Searing from the dental service and cleared for surgical intervention. CT angiography was performed and the patient referred for surgical consultation.  The patient is married and lives with his wife in [place], [place]. 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 He has been disabled secondary to chronic back pain with peripheral neuropathy, limited mobility, and carpal tunnel syndrome. Over the past year or more he has had progressive worsening of fatigue with exertional shortness of breath, chronic respiratory failure, and chronic lower extremity edema. He gets short of breath with very low level activity and has been hospitalized on 2 occasions for acute exacerbation of resting shortness of breath and pulmonary edema. He denies any history of chest pain or chest tightness either with activity or at rest. He has not had any syncopal episodes since the episode he experienced in 2019. He cannot lay flat but. He has severe lower extremity edema. He currently also complains of urinary urgency and a foul odor to his urine. He states that he has had  some skin infections in the past underneath his panniculus but none recently.          Past Medical History:  Diagnosis Date   Aortic stenosis    Aortic stenosis, moderate 07/2018   felt to be moderate at cath Jan 2020   CAD (coronary artery disease) 07/2018   mild non obstructive   Chronic back pain    "mainly lower right now" (08/12/2018)   Chronic combined systolic and diastolic heart failure (HCC)    Depression    Diabetic neuropathy (HCC)    GERD (gastroesophageal reflux disease)    High cholesterol    Hypertension    Morbid obesity with BMI of 45.0-49.9, adult (HCC)    Morbid obesity with body mass index of 50 or higher (HCC)    NICM (nonischemic cardiomyopathy) (HCC) 07/2018   EF 35-40%   RBBB    Sleep apnea    "lost weight; not on CPAP anymore" (08/12/2018)   Spinal stenosis of lumbar region    "w/slipped disc" (08/12/2018)   Syncope    Felt to be secondary to autonomic dysfunction-s/p Loop recorder Feb 2019   Type II diabetes mellitus (HCC)         Past Surgical History:  Procedure Laterality Date   HERNIA REPAIR     LAPAROSCOPIC GASTRIC BANDING     LOOP RECORDER INSERTION N/A 09/01/2017   Procedure: LOOP RECORDER INSERTION; Surgeon: Duke Salvia, MD; Location: [facility] INVASIVE CV LAB; Service: Cardiovascular; Laterality: N/A;   RIGHT/LEFT HEART CATH AND CORONARY ANGIOGRAPHY N/A 08/14/2018   Procedure: RIGHT/LEFT HEART CATH AND CORONARY ANGIOGRAPHY; Surgeon: Yvonne Kendall, MD; Location: [facility] INVASIVE CV LAB; Service: Cardiovascular; Laterality: N/A;   SHOULDER ARTHROSCOPY Right    TONSILLECTOMY     UMBILICAL HERNIA REPAIR          Family History  Problem Relation Age of Onset   Cancer Mother    Diabetes Mother    Parkinson's disease Father    Heart failure Father    Social History        Socioeconomic History   Marital status: Married    Spouse name: Not on file   Number of children: Not on file   Years of education: Not on file   Highest education level: Not on file  Occupational History   Not on file   Social Needs   Financial resource strain: Not on file   Food insecurity    Worry: Not on file    Inability: Not on file   Transportation needs    Medical: Not on file    Non-medical: Not on file  Tobacco Use   Smoking status: Never Smoker   Smokeless tobacco: Never Used  Substance and Sexual Activity   Alcohol use: No    Frequency: Never   Drug use: Never   Sexual activity: Not Currently  Lifestyle   Physical activity    Days per week: Not on file    Minutes per session: Not on file   Stress: Not on file  Relationships   Social connections    Talks on phone: Not on file    Gets together: Not on file    Attends religious service: Not on file    Active member of club or organization: Not on file    Attends meetings of clubs or organizations: Not on file    Relationship status: Not on file   Intimate partner violence    Fear of current or ex partner: Not on  file    Emotionally abused: Not on file    Physically abused: Not on file    Forced sexual activity: Not on file  Other Topics Concern   Not on file  Social History Narrative   Not on file         Current Outpatient Medications  Medication Sig Dispense Refill   atorvastatin (LIPITOR) 40 MG tablet Take 40 mg by mouth daily.      buPROPion (WELLBUTRIN XL) 300 MG 24 hr tablet Take 300 mg by mouth daily.      Calcium-Magnesium-Zinc (CAL-MAG-ZINC PO) Take 1 tablet by mouth daily with breakfast.     chlorhexidine (PERIDEX) 0.12 % solution Use as directed 15 mLs in the mouth or throat 2 (two) times daily. 120 mL 6   escitalopram (LEXAPRO) 20 MG tablet Take 20 mg by mouth daily.     furosemide (LASIX) 40 MG tablet Take 1.5 tablets (60 mg total) by mouth daily. 45 tablet 0   gabapentin (NEURONTIN) 600 MG tablet Take 600-1,200 mg by mouth See admin instructions. Take 600 mg by mouth in the morning and 1,200 mg at bedtime     HYDROcodone-acetaminophen (NORCO/VICODIN) 5-325 MG tablet Take 1 tablet by mouth  4 (four) times daily.      insulin NPH Human (NOVOLIN N RELION) 100 UNIT/ML injection Inject 40 Units into the skin See admin instructions. Inject 40 units into the skin two to three times a day before meals     insulin regular (NOVOLIN R RELION) 100 units/mL injection Inject 20 Units into the skin See admin instructions. Inject 20 units into the skin two to three times a day before meals     metoprolol tartrate (LOPRESSOR) 25 MG tablet Take 25 mg by mouth daily.     multivitamin (ONE-A-DAY MEN'S) TABS tablet Take 1 tablet by mouth daily with breakfast.     pantoprazole (PROTONIX) 40 MG tablet Take 40 mg by mouth every morning.     potassium chloride SA (KLOR-CON) 20 MEQ tablet Take 1 tablet (20 mEq total) by mouth daily. 15 tablet 0   tamsulosin (FLOMAX) 0.4 MG CAPS capsule Take 1 capsule (0.4 mg total) by mouth daily after supper. 30 capsule 0   vitamin B-12 (CYANOCOBALAMIN) 1000 MCG tablet Take 1,000 mcg by mouth daily.     phenazopyridine (PYRIDIUM) 100 MG tablet Take 1 tablet (100 mg total) by mouth 3 (three) times daily with meals. (Patient not taking: Reported on 06/18/2019) 10 tablet 0   No current facility-administered medications for this visit.         Allergies  Allergen Reactions   Methyldopa-Hydrochlorothiazide Other (See Comments)    GYNOCOMASTIA   Motrin [Ibuprofen] Other (See Comments)    GYNOCOMASTIA   Nsaids Other (See Comments)    Patient has had lap band surgery- cannot tolerate this class of meds   Amoxicillin Rash   Review of Systems:   General: normal appetite, decreased energy, + weight gain, + weight loss, no fever  Cardiac: no chest pain with exertion, no chest pain at rest, + SOB with minimal exertion, occasional resting SOB, no PND, + orthopnea, no palpitations, no arrhythmia, no atrial fibrillation, + LE edema, no dizzy spells, + syncope  Respiratory: + chronic shortness of breath, no home oxygen, no productive cough, + dry cough, no bronchitis,  no wheezing, no hemoptysis, no asthma, no pain with inspiration or cough, + sleep apnea, + CPAP at night  GI: no difficulty swallowing, + reflux, no frequent  heartburn, no hiatal hernia, no abdominal pain, no constipation, no diarrhea, no hematochezia, no hematemesis, no melena  GU: + dysuria, + frequency, ? urinary tract infection, no hematuria, no enlarged prostate, no kidney stones, no kidney disease  Vascular: + pain suggestive of claudication, + pain in feet, no leg cramps, no varicose veins, no DVT, no non-healing foot ulcer  Neuro: no stroke, no TIA's, no seizures, no headaches, no temporary blindness one eye, no slurred speech, + peripheral neuropathy, + chronic pain, + instability of gait, no memory/cognitive dysfunction  Musculoskeletal: + arthritis, + joint swelling, no myalgias, + difficulty walking, limited mobility  Skin: no rash, no itching, no skin infections, no pressure sores or ulcerations  Psych: no anxiety, no depression, no nervousness, no unusual recent stress  Eyes: no blurry vision, no floaters, no recent vision changes, does not wears glasses or contacts  ENT: no hearing loss, no loose or painful teeth, no dentures, last saw dentist in the hospital  Hematologic: no easy bruising, no abnormal bleeding, no clotting disorder, no frequent epistaxis  Endocrine: + diabetes, does check CBG's at home    Physical Exam:   BP (!) 74/53   Pulse 100   Temp 97.8 F (36.6 C) (Skin)   Resp 20   Ht 5\' 11"  (1.803 m)   Wt (!) 376 lb (170.6 kg)   SpO2 90% Comment: RA   BMI 52.44 kg/m  General: Morbidly obese male NAD  HEENT: Unremarkable  Neck: no JVD, no bruits, no adenopathy  Chest: clear to auscultation, symmetrical breath sounds, no wheezes, no rhonchi  CV: RRR, grade II/VI crescendo/decrescendo murmur heard best at RSB, no diastolic murmur  Abdomen: soft, non-tender, no masses  Extremities: warm, well-perfused, pulses not palpable, + bilateral LE edema  Rectal/GU Deferred    Neuro: Grossly non-focal and symmetrical throughout  Skin: Clean and dry, no rashes, no breakdown    Diagnostic Tests:   ECHOCARDIOGRAM REPORT  Patient Name: TYRAY PROCH Date of Exam: 06/05/2019  Medical Rec #: [id number] Height: 71.0 in  Accession #: [id number] Weight: 393.7 lb  Date of Birth: [birth year] BSA: 2.81 m  Patient Age: 26 years BP: 103/32 mmHg  Patient Gender: M HR: 86 bpm.  Exam Location: Inpatient  Procedure: 2D Echo  Indications: acute systolic CHF 428.21  History: Patient has prior history of Echocardiogram examinations, most  recent 08/13/2018. CHF, CAD; Aortic Valve Disease  Signs/Symptoms:Dyspnea Risk Factors:Diabetes and Sleep Apnea.  Sonographer: Delcie Roch  Referring Phys: [phone] STEPHEN K CHIU  Sonographer Comments: Technically difficult study due to poor echo windows. Image acquisition challenging due to patient body habitus.  IMPRESSIONS  1. Left ventricular ejection fraction, by visual estimation, is 30 to 35%. The left ventricle has moderate to severely decreased function. There is no left ventricular hypertrophy.  2. Definity contrast agent was given IV to delineate the left ventricular endocardial borders.  3. Left ventricular diastolic parameters are indeterminate.  4. Moderately dilated left ventricular internal cavity size.  5. Diffuse hypokinesis worse in the apex and septum.  6. Global right ventricle has normal systolic function.The right ventricular size is normal. No increase in right ventricular wall thickness.  7. Left atrial size was moderately dilated.  8. Right atrial size was not well visualized.  9. Moderate mitral annular calcification.  10. Severe calcification of the mitral valve leaflet(s).  11. Severe thickening of the mitral valve leaflet(s).  12. The mitral valve is degenerative. Trace mitral valve regurgitation.  13. The tricuspid valve is  normal in structure. Tricuspid valve regurgitation is mild.  14. The aortic valve is  tricuspid. Aortic valve regurgitation is not visualized. Severe aortic valve stenosis.  15. Severely calcified with restricted leaflet motion Likely low flow severe AS DVI 0.20 mean gradient increased from 28 mmHg to 35 mmHg peak increased from 47 to 51 mmHg since January.  16. The pulmonic valve was grossly normal. Pulmonic valve regurgitation is mild.  17. The interatrial septum was not well visualized.  FINDINGS  Left Ventricle: Left ventricular ejection fraction, by visual estimation, is 30 to 35%. The left ventricle has moderate to severely decreased function. Definity contrast agent was given IV to delineate the left ventricular endocardial borders. The left  ventricular internal cavity size was moderately dilated left ventricle. There is no left ventricular hypertrophy. Left ventricular diastolic parameters are indeterminate. Diffuse hypokinesis worse in the apex and septum.  Right Ventricle: The right ventricular size is normal. No increase in right ventricular wall thickness. Global RV systolic function is has normal systolic function.  Left Atrium: Left atrial size was moderately dilated.  Right Atrium: Right atrial size was not well visualized  Pericardium: There is no evidence of pericardial effusion.  Mitral Valve: The mitral valve is degenerative in appearance. There is severe thickening of the mitral valve leaflet(s). There is severe calcification of the mitral valve leaflet(s). Moderate mitral annular calcification. Trace mitral valve  regurgitation.  Tricuspid Valve: The tricuspid valve is normal in structure. Tricuspid valve regurgitation is mild.  Aortic Valve: The aortic valve is tricuspid. Aortic valve regurgitation is not visualized. Severe aortic stenosis is present. Aortic valve mean gradient measures 35.0 mmHg. Aortic valve peak gradient measures 50.7 mmHg. Aortic valve area, by VTI measures  0.84 cm. Severely calcified with restricted leaflet motion Likely low flow severe AS  DVI 0.20 mean gradient increased from 28 mmHg to 35 mmHg peak increased from 47 to 51 mmHg since January.  Pulmonic Valve: The pulmonic valve was grossly normal. Pulmonic valve regurgitation is mild.  Aorta: The aortic root is normal in size and structure.  IAS/Shunts: The interatrial septum was not well visualized.  LEFT VENTRICLE  PLAX 2D  LVIDd: 6.00 cm  LVIDs: 4.90 cm  LV PW: 1.30 cm  LV IVS: 1.10 cm  LVOT diam: 2.30 cm  LV SV: 67 ml  LV SV Index: 21.73  LVOT Area: 4.15 cm  LV Volumes (MOD)  LV area d, A4C: 43.30 cm  LV area s, A4C: 28.80 cm  LV major d, A4C: 9.18 cm  LV major s, A4C: 8.16 cm  LV vol d, MOD A4C: 165.0 ml  LV vol s, MOD A4C: 83.2 ml  LV SV MOD A4C: 165.0 ml  RIGHT VENTRICLE  RV S prime: 9.68 cm/s  TAPSE (M-mode): 2.0 cm  LEFT ATRIUM Index RIGHT ATRIUM Index  LA diam: 4.90 cm 1.74 cm/m RA Area: 18.50 cm  LA Vol (A2C): 89.0 ml 31.65 ml/m RA Volume: 48.90 ml 17.39 ml/m  LA Vol (A4C): 80.0 ml 28.45 ml/m  LA Biplane Vol: 87.7 ml 31.19 ml/m  AORTIC VALVE  AV Area (Vmax): 0.90 cm  AV Area (Vmean): 0.83 cm  AV Area (VTI): 0.84 cm  AV Vmax: 356.00 cm/s  AV Vmean: 277.000 cm/s  AV VTI: 0.731 m  AV Peak Grad: 50.7 mmHg  AV Mean Grad: 35.0 mmHg  LVOT Vmax: 77.50 cm/s  LVOT Vmean: 55.600 cm/s  LVOT VTI: 0.148 m  LVOT/AV VTI ratio: 0.20  AORTA  Ao Root diam: 3.60  cm  SHUNTS  Systemic VTI: 0.15 m  Systemic Diam: 2.30 cm  Charlton Haws MD  Electronically signed by Charlton Haws MD  Signature Date/Time: 06/05/2019/11:58:16 AM    RIGHT/LEFT HEART CATH AND CORONARY ANGIOGRAPHY  Conclusion  Conclusions:  1. Mild to moderate, non-obstructive CAD. 2. Mildly to moderately elevated left heart filling pressures. 3. Moderately elevated right heart and pulmonary artery pressures. Prominent RA tracing V-waves raise possibility of significant tricuspid regurgitation. 4. Low normal Fick cardiac output/index. 5. Moderate aortic stenosis. Recommendations:    1. Continue diuresis. 2. Further workup and management of aortic stenosis per structural heart team. 3. Medical therapy to prevent progression of coronary artery disease. Yvonne Kendall, MD  [facility]  Pager: [phone]   Recommendations  Antiplatelet/Anticoag Recommend Aspirin 81mg  daily for moderate CAD.  Indications  Aortic valve stenosis, etiology of cardiac valve disease unspecified [I35.0 (ICD-10-CM)]  Acute on chronic heart failure with preserved ejection fraction (HFpEF) (HCC) [I50.33 (ICD-10-CM)]  Procedural Details  Technical Details Indication: 68 y.o. year-old man with history of HTN, HLD, DM, obesity, aortic stenosis and sleep apnea, admitted with shortness of breath and chest pain. Echocardiogram showed interval decline in LVEF and moderate to severe aortic stenosis.  GFR: >60 ml/min  Procedure: The risks, benefits, complications, treatment options, and expected outcomes were discussed with the patient. The patient and/or family concurred with the proposed plan, giving informed consent. The patient was brought to the cath lab after IV hydration was begun and oral premedication was given. The patient was further sedated with Versed and Fentanyl. The right wrist was assessed with a modified Allens test which was normal. The right wrist and elbow were prepped and draped in a sterile fashion. 1% lidocaine was used for local anesthesia. Ultrasound was used to evaluate the right basilic vein. It was patent. An ultrasound image was saved in the permanent record. A micropuncture needle was used to access the right basilic vein under ultrasound guidance. A 12F slender Glidesheath was inserted using modified Seldinger technique. Right heart catheterization was performed by advancing a 12F balloon-tipped catheter through the right heart chambers into the pulmonary capillary wedge position. Pressure measurements and oxygen saturations were obtained.  Ultrasound was used to evaluate the  right radial artery. It was patent. An ultrasound image was saved in the permanent record. A micropuncture needle was used to access the right radial artery under ultrasound guidance. Using the modified Seldinger access technique, a 69F slender Glidesheath was placed in the right radial artery. 3 mg Verapamil was given through the sheath. Heparin 5,000 units were administered. Selective coronary angiography was performed using 12F JL3.5 and JR4 catheters to engage the left and right coronary arteries, respectively. Left heart catheterization was performed using a 12F JR4 catheter and straight wire. The JR4 catheter was exchanged for a 69F dual lumen pigtail catheter over an exchange length J wire to allow for simultaneous measurement of LV and aortic pressures. Left ventriculogram was not performed.  At the end of the procedure, the radial artery sheath was removed and a TR band applied to achieve patent hemostasis. The basilic vein sheath was removed and hemostasis achieved with manual compression. There were no immediate complications. The patient was taken to the recovery area in stable condition.  Contrast used: 70 mL Fluoroscopy time: 7.3 min Radiation dose: 922 mGy Estimated blood loss <50 mL.   During this procedure no sedation was administered.   Complications documented before study signed (08/14/2018 6:57 PM)   No complications were  associated with this study.  Documented by Yvonne KendallEnd, Christopher, MD - 08/14/2018 6:53 PM  Coronary Findings  Diagnostic  Dominance: Right  Left Main  Vessel is large.  Left Anterior Descending  Vessel is large.  Prox LAD lesion 25% stenosed  Prox LAD lesion is 25% stenosed.  First Diagonal Branch  Vessel is large in size.  Ost 1st Diag lesion 35% stenosed  Ost 1st Diag lesion is 35% stenosed.  Left Circumflex  Vessel is large. Vessel is angiographically normal.  First Obtuse Marginal Branch  Vessel is large in size.  Second Obtuse Marginal Branch  Vessel  is small in size.  Right Coronary Artery  Vessel is large. The vessel exhibits minimal luminal irregularities.  Intervention  No interventions have been documented.  Right Heart  Right Heart Pressures RA (mean): 15 mmHg with prominent V-waves RV (S/EDP): 46/15 mmHg PA (S/D, mean): 44/30 (35) mmHg PCWP (mean): 25 mmHg  Ao sat: 96% PA sat: 65%  Fick CO: 7.2 L/min Fick CI: 2.5 L/min/m^2  Left Heart  Left Ventricle LV end diastolic pressure is mildly elevated. LVEDP 20-25 mmHg.  Aortic Valve There is moderate aortic valve stenosis. Mean gradient: 23 mmHg. Peak-to-peak gradient: 30 mmHg. Valve area: 1.4 cm^2.  Coronary Diagrams  Diagnostic  Dominance: Right   Intervention  Implants     No implant documentation for this case.  Syngo Images  Link to Procedure Log   Show images for CARDIAC CATHETERIZATION Procedure Log  Images on Long Term Storage    Show images for Clare GandyBowyer, Joseth W   [facility]   Most Recent Value  Fick Cardiac Output 7.19 L/min  Fick Cardiac Output Index 2.52 (L/min)/BSA  Aortic Mean Gradient 23.4 mmHg  Aortic Peak Gradient 30 mmHg  Aortic Valve Area 1.41  Aortic Value Area Index 0.5 cm2/BSA  RA A Wave 12 mmHg  RA V Wave 25 mmHg  RA Mean 14 mmHg  RV Systolic Pressure 39 mmHg  RV Diastolic Pressure 6 mmHg  RV EDP 10 mmHg  PA Systolic Pressure 32 mmHg  PA Diastolic Pressure 16 mmHg  PA Mean 23 mmHg  PW A Wave 22 mmHg  PW V Wave 19 mmHg  PW Mean 19 mmHg  AO Systolic Pressure 104 mmHg  AO Diastolic Pressure 70 mmHg  AO Mean 85 mmHg  LV Systolic Pressure 134 mmHg  LV Diastolic Pressure 13 mmHg  LV EDP 18 mmHg  AOp Systolic Pressure 104 mmHg  AOp Diastolic Pressure 68 mmHg  AOp Mean Pressure 84 mmHg  LVp Systolic Pressure 135 mmHg  LVp Diastolic Pressure 14 mmHg  LVp EDP Pressure 18 mmHg  QP/QS 1  TPVR Index 9.11 HRUI  TSVR Index 33.3 HRUI  PVR SVR Ratio 0.06  TPVR/TSVR Ratio 0.27    Cardiac TAVR CT  TECHNIQUE:  The patient was scanned on a  [employer] scanner. A 120 kV  retrospective scan was triggered in the descending thoracic aorta at  111 HU's. Gantry rotation speed was 250 msecs and collimation was .6  mm. No beta blockade or nitro were given. The 3D data set was  reconstructed in 5% intervals of the R-R cycle. Systolic and  diastolic phases were analyzed on a dedicated work station using  MPR, MIP and VRT modes. The patient received 80 cc of contrast.  FINDINGS:  Aortic Valve: Trileaflet, with severely calcified and thickened  leaflets and severely restricted leaflet opening.  Aorta: Normal size with minimal atherosclerotic plaque and  calcifications.  Sinotubular Junction:  32 x 31 mm  Ascending Thoracic Aorta: 38 x 37 mm  Aortic Arch: 32 x 31 mm  Descending Thoracic Aorta: 28 x 26 mm  Sinus of Valsalva Measurements:  Non-coronary: 35 mm  Right -coronary: 34 mm  Left -coronary: 35 mm  Coronary Artery Height above Annulus:  Left Main: 15 mm  Right Coronary: 17 mm  Virtual Basal Annulus Measurements:  Maximum/Minimum Diameter: 31.5 x 28.4 mm  Mean Diameter: 29.8 mm  Perimeter: 94.5 mm  Area: 696 mm2  Optimum Fluoroscopic Angle for Delivery: RAO 8 CRA 8.  IMPRESSION:  1. Trileaflet, with severely calcified and thickened leaflets and  severely restricted leaflet opening. Aortic valve calcium score is  3437 consistent with severe aortic stenosis. Patient's scan quality  is affected by patient's size, annular size might be overestimated  and appears too large for a 29 mm Edwards-SAPIEN 3 valve. Given  patient's size, this patient might benefit from a 34 mm Evolut R  CoreValve.  2. Sufficient coronary to annulus distance.  3. Optimum Fluoroscopic Angle for Delivery: RAO 8 CRA 8.  4. No thrombus in the left atrial appendage.  5. Mildly dilated pulmonary artery measuring 33 mm.  Electronically Signed:  By: Tobias Alexander  On: 06/09/2019 15:19    CT ANGIOGRAPHY CHEST, ABDOMEN AND PELVIS  TECHNIQUE:    Multidetector CT imaging through the chest, abdomen and pelvis was  performed using the standard protocol during bolus administration of  intravenous contrast. Multiplanar reconstructed images and MIPs were  obtained and reviewed to evaluate the vascular anatomy.  CONTRAST: OMNIPAQUE IOHEXOL 350 MG/ML SOLN  COMPARISON: No priors.  FINDINGS:  CTA CHEST FINDINGS  Cardiovascular: Heart size is enlarged. There is no significant  pericardial fluid, thickening or pericardial calcification. There is  aortic atherosclerosis, as well as atherosclerosis of the great  vessels of the mediastinum and the coronary arteries, including  calcified atherosclerotic plaque in the left main, left anterior  descending, left circumflex and right coronary arteries. Severe  thickening calcification of the aortic valve. Moderate  calcifications of the mitral annulus.  Mediastinum/Lymph Nodes: Multiple prominent borderline enlarged and  mildly enlarged mediastinal lymph nodes, largest of which measures  1.5 cm in short axis in the right paratracheal nodal station. No  hilar lymphadenopathy. Patulous distal esophagus. No axillary  lymphadenopathy.  Lungs/Pleura: Patchy multifocal areas of peribronchovascular  ground-glass attenuation throughout the right lung with some  peribronchovascular micronodularity, largest of which is in the  right lower lobe measuring 8 x 5 mm (mean diameter of 6.5 mm). No  other larger more suspicious appearing pulmonary nodules or masses  are noted. No pleural effusions.  Musculoskeletal/Soft Tissues: There are no aggressive appearing  lytic or blastic lesions noted in the visualized portions of the  skeleton.  CTA ABDOMEN AND PELVIS FINDINGS  Hepatobiliary: No suspicious cystic or solid hepatic lesions. No  intra or extrahepatic biliary ductal dilatation. Gallbladder is  normal in appearance.  Pancreas: No pancreatic mass. No pancreatic ductal dilatation. No  pancreatic or  peripancreatic fluid collections or inflammatory  changes.  Spleen: Unremarkable.  Adrenals/Urinary Tract: 2.1 cm intermediate attenuation (38 HU)  lesion (axial image 136 of series 15) in the posterior aspect of the  interpolar region of the right kidney, incompletely characterized.  Left kidney and bilateral adrenal glands are normal in appearance.  No hydroureteronephrosis. Urinary bladder is nearly decompressed,  with a Foley balloon catheter in place, and small amount of gas non  dependently in the lumen of  the urinary bladder which is presumably  iatrogenic.  Stomach/Bowel: LapBand in place around the proximal stomach. Stomach  is otherwise unremarkable in appearance. No pathologic dilatation of  small bowel or colon. Normal appendix.  Vascular/Lymphatic: Mild aortic atherosclerosis, without evidence of  aneurysm or dissection in the abdominal or pelvic vasculature.  Vascular findings and measurements pertinent to potential TAVR  procedure, as detailed below. No lymphadenopathy noted in the  abdomen or pelvis.  Reproductive: Prostate gland and seminal vesicles are unremarkable  in appearance.  Other: No significant volume of ascites. No pneumoperitoneum.  Musculoskeletal: There are no aggressive appearing lytic or blastic  lesions noted in the visualized portions of the skeleton.  VASCULAR MEASUREMENTS PERTINENT TO TAVR:  AORTA:  Minimal Aortic Diameter-18 x 18 mm  Severity of Aortic Calcification-mild  RIGHT PELVIS:  Right Common Iliac Artery -  Minimal Diameter-13.1 x 11.5 mm  Tortuosity-mild  Calcification-minimal  Right External Iliac Artery -  Minimal Diameter-8.4 x 7.9 mm  Tortuosity-moderate to severe  Calcification-none  Right Common Femoral Artery -  Minimal Diameter-10.0 x 9.2 mm  Tortuosity-mild  Calcification - none  LEFT PELVIS:  Left Common Iliac Artery -  Minimal Diameter-13.4 x 11.6 mm  Tortuosity-mild  Calcification-minimal  Left External Iliac  Artery -  Minimal Diameter-8.3 x 8.6 mm  Tortuosity-moderate  Calcification - none  Left Common Femoral Artery -  Minimal Diameter-7.8 x 7.7 mm  Tortuosity-mild  Calcification - none  Review of the MIP images confirms the above findings.  IMPRESSION:  1. Vascular findings and measurements pertinent to potential TAVR  procedure, as detailed above.  2. Severe thickening calcification of the aortic valve, compatible  with the reported clinical history of severe aortic stenosis.  3. Aortic atherosclerosis, in addition to left main and 3 vessel  coronary artery disease.  4. Cardiomegaly.  5. Multiple borderline enlarged and mildly enlarged mediastinal  lymph nodes measuring up to 1.5 cm in the right paratracheal nodal  station. This is nonspecific, but favored to be reactive given the  findings in the right lung which likely reflect mild multilobar  bronchopneumonia.  6. Multiple small pulmonary nodules in the right lung, largest of  which has a mean diameter of 6.5 mm in the right lower lobe.  Non-contrast chest CT at 3-6 months is recommended. If the nodules  are stable at time of repeat CT, then future CT at 18-24 months  (from today's scan) is considered optional for low-risk patients,  but is recommended for high-risk patients. This recommendation  follows the consensus statement: Guidelines for Management of  Incidental Pulmonary Nodules Detected on CT Images: From the  Fleischner Society 2017; Radiology 2017; 284:228-243.  7. 2.1 cm intermediate attenuation lesion in the right kidney,  incompletely characterized on today's examination. The possibility  of a small renal neoplasm is not excluded, and further  characterization with nonemergent abdominal MRI with and without IV  gadolinium is recommended in the near future.  Electronically Signed  By: Trudie Reed M.D.  On: 06/09/2019 15:35    Impression:   Patient has stage D severe symptomatic aortic stenosis. He suffers  from chronic symptoms of exertional shortness of breath, fatigue, and lower extremity edema consistent with chronic combined systolic and diastolic congestive heart failure, New York Heart Association functional class IIIb. Over the past year he has had 2 hospitalizations for acute exacerbations with class IV symptoms and pulmonary edema, most recently just 2 weeks ago. I have personally reviewed the patient's most recent transthoracic echocardiogram,  diagnostic cardiac catheterization, and CT angiograms. Echocardiogram confirms the presence of severe aortic stenosis. The aortic valve is trileaflet with severe thickening, calcification, and restricted leaflet mobility involving all 3 leaflets of the aortic valve. Peak velocity across aortic valve measured 3.6 m/s corresponding to mean transvalvular gradient estimated 35 mmHg in the setting of severe left ventricular systolic dysfunction with ejection fraction only 30 to 35%. DVI was notably quite low at 0.20 and aortic valve area calculated only 0.84 cm. Diagnostic cardiac catheterization performed January 2020 revealed moderate nonobstructive coronary artery disease. I agree the patient would benefit from aortic valve replacement. Risks associated with conventional surgery would be somewhat elevated because of the patient's extreme morbid obesity and numerous other comorbid medical problems. Cardiac-gated CTA of the heart reveals anatomical characteristics consistent with aortic stenosis suitable for treatment by transcatheter aortic valve replacement without any significant complicating features. The gated scan was of marginal quality and there is some question regarding the absolute size of the patient's aortic root. However, I have personally reviewed the images and feel that the patient's valve can be treated using a 29 mm Edwards's Sapien 3 transcatheter heart valve without unusual risk. CTA of the aorta and iliac vessels demonstrate what appears to be  adequate pelvic vascular access to facilitate a transfemoral approach. The patient does have right bundle branch block at baseline and would be at somewhat increased risk for need for permanent pacemaker placement.   Plan:   The patient and his wife were counseled at length regarding treatment alternatives for management of severe symptomatic aortic stenosis. Alternative approaches such as conventional aortic valve replacement, transcatheter aortic valve replacement, and continued medical therapy without intervention were compared and contrasted at length. The risks associated with conventional surgical aortic valve replacement were discussed in detail, as were expectations for post-operative convalescence, and why I would be reluctant to consider this patient a candidate for conventional surgery. Issues specific to transcatheter aortic valve replacement were discussed including questions about long term valve durability, the potential for paravalvular leak, possible increased risk of need for permanent pacemaker placement, and other technical complications related to the procedure itself. Long-term prognosis with medical therapy was discussed. This discussion was placed in the context of the patient's own specific clinical presentation and past medical history. All of their questions have been addressed. The patient desires to proceed with transcatheter aortic valve replacement on July 06, 2019. Because of the patient's ongoing symptoms of urinary frequency and urgency and his history of urinary retention during his recent hospitalization a repeat UA was performed which was unremarkable and urine culture was negative.  Following the decision to proceed with transcatheter aortic valve replacement, a discussion has been held regarding what types of management strategies would be attempted intraoperatively in the event of life-threatening complications, including whether or not the patient would be considered  a candidate for the use of cardiopulmonary bypass and/or conversion to open sternotomy for attempted surgical intervention. The patient specifically requests that should a potentially life-threatening complication develop we would attempt emergency median sternotomy and/or other aggressive surgical procedures. The patient has been advised of a variety of complications that might develop including but not limited to risks of death, stroke, paravalvular leak, aortic dissection or other major vascular complications, aortic annulus rupture, device embolization, cardiac rupture or perforation, mitral regurgitation, acute myocardial infarction, arrhythmia, heart block or bradycardia requiring permanent pacemaker placement, congestive heart failure, respiratory failure, renal failure, pneumonia, infection, other late complications related to structural valve deterioration or  migration, or other complications that might ultimately cause a temporary or permanent loss of functional independence or other long term morbidity. The patient provides full informed consent for the procedure as described and all questions were answered.    Gaye Pollack, MD.

## 2019-07-06 ENCOUNTER — Encounter (HOSPITAL_COMMUNITY)
Admission: RE | Disposition: A | Discharge status: Home / Self Care | Payer: Self-pay | Source: Home / Self Care | Attending: Cardiovascular Disease

## 2019-07-06 ENCOUNTER — Inpatient Hospital Stay (HOSPITAL_COMMUNITY): Payer: Medicare Other

## 2019-07-06 ENCOUNTER — Encounter (HOSPITAL_COMMUNITY): Payer: Self-pay

## 2019-07-06 ENCOUNTER — Other Ambulatory Visit: Payer: Self-pay

## 2019-07-06 ENCOUNTER — Other Ambulatory Visit (HOSPITAL_COMMUNITY): Payer: Medicare Other

## 2019-07-06 ENCOUNTER — Inpatient Hospital Stay (HOSPITAL_COMMUNITY): Payer: Medicare Other | Admitting: Physician Assistant

## 2019-07-06 ENCOUNTER — Inpatient Hospital Stay (HOSPITAL_COMMUNITY)
Admission: RE | Admit: 2019-07-06 | Discharge: 2019-07-08 | DRG: 266 | Disposition: A | Discharge status: Home / Self Care | Payer: Medicare Other | Attending: Cardiovascular Disease | Admitting: Cardiovascular Disease

## 2019-07-06 ENCOUNTER — Inpatient Hospital Stay (HOSPITAL_COMMUNITY): Payer: Medicare Other | Admitting: Certified Registered Nurse Anesthetist

## 2019-07-06 ENCOUNTER — Other Ambulatory Visit: Payer: Self-pay | Admitting: Physician Assistant

## 2019-07-06 DIAGNOSIS — Z888 Allergy status to other drugs, medicaments and biological substances status: Secondary | ICD-10-CM

## 2019-07-06 DIAGNOSIS — Z881 Allergy status to other antibiotic agents status: Secondary | ICD-10-CM

## 2019-07-06 DIAGNOSIS — M549 Dorsalgia, unspecified: Secondary | ICD-10-CM | POA: Diagnosis present

## 2019-07-06 DIAGNOSIS — I428 Other cardiomyopathies: Secondary | ICD-10-CM

## 2019-07-06 DIAGNOSIS — I5043 Acute on chronic combined systolic (congestive) and diastolic (congestive) heart failure: Secondary | ICD-10-CM | POA: Diagnosis present

## 2019-07-06 DIAGNOSIS — I110 Hypertensive heart disease with heart failure: Secondary | ICD-10-CM | POA: Diagnosis present

## 2019-07-06 DIAGNOSIS — Z952 Presence of prosthetic heart valve: Secondary | ICD-10-CM | POA: Diagnosis not present

## 2019-07-06 DIAGNOSIS — Z6841 Body Mass Index (BMI) 40.0 and over, adult: Secondary | ICD-10-CM | POA: Diagnosis not present

## 2019-07-06 DIAGNOSIS — E7800 Pure hypercholesterolemia, unspecified: Secondary | ICD-10-CM | POA: Diagnosis present

## 2019-07-06 DIAGNOSIS — R918 Other nonspecific abnormal finding of lung field: Secondary | ICD-10-CM | POA: Diagnosis present

## 2019-07-06 DIAGNOSIS — R339 Retention of urine, unspecified: Secondary | ICD-10-CM | POA: Diagnosis present

## 2019-07-06 DIAGNOSIS — M48061 Spinal stenosis, lumbar region without neurogenic claudication: Secondary | ICD-10-CM | POA: Diagnosis not present

## 2019-07-06 DIAGNOSIS — K219 Gastro-esophageal reflux disease without esophagitis: Secondary | ICD-10-CM | POA: Diagnosis present

## 2019-07-06 DIAGNOSIS — E1140 Type 2 diabetes mellitus with diabetic neuropathy, unspecified: Secondary | ICD-10-CM | POA: Diagnosis present

## 2019-07-06 DIAGNOSIS — E118 Type 2 diabetes mellitus with unspecified complications: Secondary | ICD-10-CM

## 2019-07-06 DIAGNOSIS — Z9884 Bariatric surgery status: Secondary | ICD-10-CM | POA: Diagnosis not present

## 2019-07-06 DIAGNOSIS — Z950 Presence of cardiac pacemaker: Secondary | ICD-10-CM

## 2019-07-06 DIAGNOSIS — I442 Atrioventricular block, complete: Secondary | ICD-10-CM | POA: Diagnosis present

## 2019-07-06 DIAGNOSIS — F329 Major depressive disorder, single episode, unspecified: Secondary | ICD-10-CM | POA: Diagnosis present

## 2019-07-06 DIAGNOSIS — I081 Rheumatic disorders of both mitral and tricuspid valves: Secondary | ICD-10-CM | POA: Diagnosis not present

## 2019-07-06 DIAGNOSIS — G4730 Sleep apnea, unspecified: Secondary | ICD-10-CM | POA: Diagnosis present

## 2019-07-06 DIAGNOSIS — Z794 Long term (current) use of insulin: Secondary | ICD-10-CM | POA: Diagnosis not present

## 2019-07-06 DIAGNOSIS — I4510 Unspecified right bundle-branch block: Secondary | ICD-10-CM | POA: Diagnosis present

## 2019-07-06 DIAGNOSIS — I440 Atrioventricular block, first degree: Secondary | ICD-10-CM | POA: Diagnosis present

## 2019-07-06 DIAGNOSIS — Z886 Allergy status to analgesic agent status: Secondary | ICD-10-CM | POA: Diagnosis not present

## 2019-07-06 DIAGNOSIS — I5022 Chronic systolic (congestive) heart failure: Secondary | ICD-10-CM | POA: Diagnosis not present

## 2019-07-06 DIAGNOSIS — I2510 Atherosclerotic heart disease of native coronary artery without angina pectoris: Secondary | ICD-10-CM | POA: Diagnosis present

## 2019-07-06 DIAGNOSIS — I452 Bifascicular block: Secondary | ICD-10-CM | POA: Diagnosis present

## 2019-07-06 DIAGNOSIS — I350 Nonrheumatic aortic (valve) stenosis: Secondary | ICD-10-CM | POA: Diagnosis present

## 2019-07-06 DIAGNOSIS — G4733 Obstructive sleep apnea (adult) (pediatric): Secondary | ICD-10-CM | POA: Diagnosis present

## 2019-07-06 DIAGNOSIS — Z006 Encounter for examination for normal comparison and control in clinical research program: Secondary | ICD-10-CM | POA: Diagnosis not present

## 2019-07-06 DIAGNOSIS — I5041 Acute combined systolic (congestive) and diastolic (congestive) heart failure: Secondary | ICD-10-CM | POA: Diagnosis present

## 2019-07-06 DIAGNOSIS — N289 Disorder of kidney and ureter, unspecified: Secondary | ICD-10-CM

## 2019-07-06 DIAGNOSIS — G8929 Other chronic pain: Secondary | ICD-10-CM | POA: Diagnosis present

## 2019-07-06 HISTORY — DX: Atrioventricular block, first degree: I44.0

## 2019-07-06 HISTORY — PX: TRANSCATHETER AORTIC VALVE REPLACEMENT, TRANSFEMORAL: SHX6400

## 2019-07-06 HISTORY — DX: Presence of prosthetic heart valve: Z95.2

## 2019-07-06 HISTORY — PX: TEE WITHOUT CARDIOVERSION: SHX5443

## 2019-07-06 HISTORY — DX: Disorder of kidney and ureter, unspecified: N28.9

## 2019-07-06 HISTORY — DX: Other nonspecific abnormal finding of lung field: R91.8

## 2019-07-06 HISTORY — DX: Nonrheumatic aortic (valve) stenosis: I35.0

## 2019-07-06 LAB — POCT I-STAT, CHEM 8
BUN: 13 mg/dL (ref 8–23)
BUN: 12 mg/dL (ref 8–23)
BUN: 12 mg/dL (ref 8–23)
BUN: 24 mg/dL — ABNORMAL HIGH (ref 8–23)
BUN: 11 mg/dL (ref 8–23)
BUN: 23 mg/dL (ref 8–23)
Calcium, Ion: 1.15 mmol/L (ref 1.15–1.40)
Calcium, Ion: 1.15 mmol/L (ref 1.15–1.40)
Calcium, Ion: 1.15 mmol/L (ref 1.15–1.40)
Calcium, Ion: 1.23 mmol/L (ref 1.15–1.40)
Calcium, Ion: 1.14 mmol/L — ABNORMAL LOW (ref 1.15–1.40)
Calcium, Ion: 1.21 mmol/L (ref 1.15–1.40)
Chloride: 99 mmol/L (ref 98–111)
Chloride: 98 mmol/L (ref 98–111)
Chloride: 107 mmol/L (ref 98–111)
Chloride: 97 mmol/L — ABNORMAL LOW (ref 98–111)
Chloride: 107 mmol/L (ref 98–111)
Chloride: 98 mmol/L (ref 98–111)
Creatinine, Ser: 0.7 mg/dL (ref 0.61–1.24)
Creatinine, Ser: 0.6 mg/dL — ABNORMAL LOW (ref 0.61–1.24)
Creatinine, Ser: 0.7 mg/dL (ref 0.61–1.24)
Creatinine, Ser: 0.9 mg/dL (ref 0.61–1.24)
Creatinine, Ser: 0.7 mg/dL (ref 0.61–1.24)
Creatinine, Ser: 0.9 mg/dL (ref 0.61–1.24)
Glucose, Bld: 169 mg/dL — ABNORMAL HIGH (ref 70–99)
Glucose, Bld: 205 mg/dL — ABNORMAL HIGH (ref 70–99)
Glucose, Bld: 128 mg/dL — ABNORMAL HIGH (ref 70–99)
Glucose, Bld: 205 mg/dL — ABNORMAL HIGH (ref 70–99)
Glucose, Bld: 135 mg/dL — ABNORMAL HIGH (ref 70–99)
Glucose, Bld: 198 mg/dL — ABNORMAL HIGH (ref 70–99)
HCT: 39 % (ref 39.0–52.0)
HCT: 39 % (ref 39.0–52.0)
HCT: 39 % (ref 39.0–52.0)
HCT: 40 % (ref 39.0–52.0)
HCT: 37 % — ABNORMAL LOW (ref 39.0–52.0)
HCT: 38 % — ABNORMAL LOW (ref 39.0–52.0)
Hemoglobin: 13.3 g/dL (ref 13.0–17.0)
Hemoglobin: 13.3 g/dL (ref 13.0–17.0)
Hemoglobin: 13.3 g/dL (ref 13.0–17.0)
Hemoglobin: 13.6 g/dL (ref 13.0–17.0)
Hemoglobin: 12.6 g/dL — ABNORMAL LOW (ref 13.0–17.0)
Hemoglobin: 12.9 g/dL — ABNORMAL LOW (ref 13.0–17.0)
Potassium: 3.6 mmol/L (ref 3.5–5.1)
Potassium: 3.7 mmol/L (ref 3.5–5.1)
Potassium: 3.8 mmol/L (ref 3.5–5.1)
Potassium: 4.1 mmol/L (ref 3.5–5.1)
Potassium: 3.4 mmol/L — ABNORMAL LOW (ref 3.5–5.1)
Potassium: 4.3 mmol/L (ref 3.5–5.1)
Sodium: 141 mmol/L (ref 135–145)
Sodium: 140 mmol/L (ref 135–145)
Sodium: 141 mmol/L (ref 135–145)
Sodium: 141 mmol/L (ref 135–145)
Sodium: 140 mmol/L (ref 135–145)
Sodium: 142 mmol/L (ref 135–145)
TCO2: 36 mmol/L — ABNORMAL HIGH (ref 22–32)
TCO2: 30 mmol/L (ref 22–32)
TCO2: 25 mmol/L (ref 22–32)
TCO2: 32 mmol/L (ref 22–32)
TCO2: 25 mmol/L (ref 22–32)
TCO2: 30 mmol/L (ref 22–32)

## 2019-07-06 LAB — GLUCOSE, CAPILLARY
Glucose-Capillary: 151 mg/dL — ABNORMAL HIGH (ref 70–99)
Glucose-Capillary: 145 mg/dL — ABNORMAL HIGH (ref 70–99)
Glucose-Capillary: 162 mg/dL — ABNORMAL HIGH (ref 70–99)

## 2019-07-06 LAB — POCT I-STAT 7, (LYTES, BLD GAS, ICA,H+H)
Acid-Base Excess: 8 mmol/L — ABNORMAL HIGH (ref 0.0–2.0)
Bicarbonate: 32.6 mmol/L — ABNORMAL HIGH (ref 20.0–28.0)
Calcium, Ion: 1.15 mmol/L (ref 1.15–1.40)
HCT: 41 % (ref 39.0–52.0)
Hemoglobin: 13.9 g/dL (ref 13.0–17.0)
O2 Saturation: 100 %
Potassium: 3.6 mmol/L (ref 3.5–5.1)
Sodium: 142 mmol/L (ref 135–145)
TCO2: 34 mmol/L — ABNORMAL HIGH (ref 22–32)
pCO2 arterial: 45.6 mmHg (ref 32.0–48.0)
pH, Arterial: 7.462 — ABNORMAL HIGH (ref 7.350–7.450)
pO2, Arterial: 179 mmHg — ABNORMAL HIGH (ref 83.0–108.0)

## 2019-07-06 SURGERY — IMPLANTATION, AORTIC VALVE, TRANSCATHETER, FEMORAL APPROACH
Anesthesia: Monitor Anesthesia Care | Site: Chest

## 2019-07-06 MED ORDER — BUPROPION HCL ER (XL) 150 MG PO TB24
300.0000 mg | ORAL_TABLET | Freq: Every day | ORAL | Status: DC
  Administered 2019-07-07 – 2019-07-08 (×2): 300 mg via ORAL
  Filled 2019-07-06 (×2): qty 2

## 2019-07-06 MED ORDER — PROPOFOL 10 MG/ML IV BOLUS
INTRAVENOUS | Status: AC
  Filled 2019-07-06: qty 40

## 2019-07-06 MED ORDER — GABAPENTIN 300 MG PO CAPS
600.0000 mg | ORAL_CAPSULE | Freq: Every day | ORAL | Status: DC
  Administered 2019-07-06 – 2019-07-08 (×3): 600 mg via ORAL
  Filled 2019-07-06 (×3): qty 2

## 2019-07-06 MED ORDER — MORPHINE SULFATE (PF) 2 MG/ML IV SOLN: 1.0000 mg | INTRAVENOUS | Status: DC | PRN

## 2019-07-06 MED ORDER — LIDOCAINE HCL 1 % IJ SOLN
INTRAMUSCULAR | Status: DC | PRN
  Administered 2019-07-06: 8 mL

## 2019-07-06 MED ORDER — PROPOFOL 10 MG/ML IV BOLUS
INTRAVENOUS | Status: DC | PRN
  Administered 2019-07-06 (×2): 10 mg via INTRAVENOUS

## 2019-07-06 MED ORDER — IODIXANOL 320 MG/ML IV SOLN
INTRAVENOUS | Status: DC | PRN
  Administered 2019-07-06: 40 mL via INTRA_ARTERIAL

## 2019-07-06 MED ORDER — SODIUM CHLORIDE 0.9 % IV SOLN: 250.0000 mL | INTRAVENOUS | Status: DC

## 2019-07-06 MED ORDER — CHLORHEXIDINE GLUCONATE 0.12 % MT SOLN
OROMUCOSAL | Status: AC
  Administered 2019-07-06: 15 mL via OROMUCOSAL
  Filled 2019-07-06: qty 15

## 2019-07-06 MED ORDER — CHLORHEXIDINE GLUCONATE 4 % EX LIQD: 60.0000 mL | Freq: Once | CUTANEOUS | Status: DC

## 2019-07-06 MED ORDER — HEPARIN SODIUM (PORCINE) 1000 UNIT/ML IJ SOLN
INTRAMUSCULAR | Status: AC
  Filled 2019-07-06: qty 1

## 2019-07-06 MED ORDER — SODIUM CHLORIDE 0.9% FLUSH: 3.0000 mL | INTRAVENOUS | Status: DC | PRN

## 2019-07-06 MED ORDER — SODIUM CHLORIDE 0.9% FLUSH
3.0000 mL | Freq: Two times a day (BID) | INTRAVENOUS | Status: DC
  Administered 2019-07-07: 3 mL via INTRAVENOUS

## 2019-07-06 MED ORDER — LIDOCAINE HCL (PF) 1 % IJ SOLN
INTRAMUSCULAR | Status: AC
  Filled 2019-07-06: qty 30

## 2019-07-06 MED ORDER — PANTOPRAZOLE SODIUM 40 MG PO TBEC
40.0000 mg | DELAYED_RELEASE_TABLET | Freq: Every day | ORAL | Status: DC
  Administered 2019-07-07 – 2019-07-08 (×2): 40 mg via ORAL
  Filled 2019-07-06 (×2): qty 1

## 2019-07-06 MED ORDER — ASPIRIN 81 MG PO CHEW
81.0000 mg | CHEWABLE_TABLET | Freq: Every day | ORAL | Status: DC
  Administered 2019-07-07 – 2019-07-08 (×2): 81 mg via ORAL
  Filled 2019-07-06 (×2): qty 1

## 2019-07-06 MED ORDER — MIDAZOLAM HCL 5 MG/5ML IJ SOLN
INTRAMUSCULAR | Status: DC | PRN
  Administered 2019-07-06: 2 mg via INTRAVENOUS

## 2019-07-06 MED ORDER — CHLORHEXIDINE GLUCONATE 4 % EX LIQD
60.0000 mL | Freq: Once | CUTANEOUS | Status: AC
  Administered 2019-07-07: 4 via TOPICAL
  Filled 2019-07-06: qty 15

## 2019-07-06 MED ORDER — LACTATED RINGERS IV SOLN
INTRAVENOUS | Status: DC | PRN
  Administered 2019-07-06: 07:00:00 via INTRAVENOUS

## 2019-07-06 MED ORDER — DEXMEDETOMIDINE HCL IN NACL 200 MCG/50ML IV SOLN
INTRAVENOUS | Status: DC | PRN
  Administered 2019-07-06: 168.92 ug via INTRAVENOUS

## 2019-07-06 MED ORDER — CHLORHEXIDINE GLUCONATE 0.12 % MT SOLN
15.0000 mL | Freq: Once | OROMUCOSAL | Status: AC
  Administered 2019-07-06: 06:00:00 15 mL via OROMUCOSAL

## 2019-07-06 MED ORDER — PROTAMINE SULFATE 10 MG/ML IV SOLN
INTRAVENOUS | Status: DC | PRN
  Administered 2019-07-06: 250 mg via INTRAVENOUS

## 2019-07-06 MED ORDER — PROPOFOL 1000 MG/100ML IV EMUL
INTRAVENOUS | Status: AC
  Filled 2019-07-06: qty 100

## 2019-07-06 MED ORDER — TRAMADOL HCL 50 MG PO TABS
50.0000 mg | ORAL_TABLET | ORAL | Status: DC | PRN
  Administered 2019-07-07: 100 mg via ORAL
  Filled 2019-07-06: qty 2

## 2019-07-06 MED ORDER — ACETAMINOPHEN 325 MG PO TABS: 650.0000 mg | ORAL_TABLET | Freq: Four times a day (QID) | ORAL | Status: DC | PRN

## 2019-07-06 MED ORDER — SODIUM CHLORIDE 0.9 % IV SOLN
INTRAVENOUS | Status: DC
  Administered 2019-07-07: 13:00:00 via INTRAVENOUS

## 2019-07-06 MED ORDER — NITROGLYCERIN IN D5W 200-5 MCG/ML-% IV SOLN: 0.0000 ug/min | INTRAVENOUS | Status: DC

## 2019-07-06 MED ORDER — TAMSULOSIN HCL 0.4 MG PO CAPS
0.4000 mg | ORAL_CAPSULE | Freq: Every day | ORAL | Status: DC
  Administered 2019-07-06: 0.4 mg via ORAL
  Filled 2019-07-06: qty 1

## 2019-07-06 MED ORDER — SODIUM CHLORIDE 0.9 % IV SOLN: INTRAVENOUS | Status: DC

## 2019-07-06 MED ORDER — ACETAMINOPHEN 650 MG RE SUPP: 650.0000 mg | Freq: Four times a day (QID) | RECTAL | Status: DC | PRN

## 2019-07-06 MED ORDER — CHLORHEXIDINE GLUCONATE 4 % EX LIQD
60.0000 mL | Freq: Once | CUTANEOUS | Status: AC
  Administered 2019-07-07: 4 via TOPICAL
  Filled 2019-07-06: qty 60

## 2019-07-06 MED ORDER — SODIUM CHLORIDE 0.9 % IV SOLN: 250.0000 mL | INTRAVENOUS | Status: DC | PRN

## 2019-07-06 MED ORDER — GABAPENTIN 600 MG PO TABS: 600.0000 mg | ORAL_TABLET | ORAL | Status: DC

## 2019-07-06 MED ORDER — LEVOFLOXACIN IN D5W 750 MG/150ML IV SOLN
750.0000 mg | INTRAVENOUS | Status: AC
  Administered 2019-07-07 (×2): 750 mg via INTRAVENOUS
  Filled 2019-07-06: qty 150

## 2019-07-06 MED ORDER — SODIUM CHLORIDE 0.9 % IV SOLN
INTRAVENOUS | Status: AC
  Filled 2019-07-06: qty 1.5

## 2019-07-06 MED ORDER — DEXMEDETOMIDINE HCL IN NACL 200 MCG/50ML IV SOLN
INTRAVENOUS | Status: AC
  Filled 2019-07-06: qty 100

## 2019-07-06 MED ORDER — MUPIROCIN 2 % EX OINT
1.0000 "application " | TOPICAL_OINTMENT | Freq: Two times a day (BID) | CUTANEOUS | Status: DC
  Administered 2019-07-06 – 2019-07-08 (×4): 1 via NASAL
  Filled 2019-07-06 (×2): qty 22

## 2019-07-06 MED ORDER — SODIUM CHLORIDE 0.9 % IV SOLN
INTRAVENOUS | Status: AC
  Filled 2019-07-06 (×3): qty 1.2

## 2019-07-06 MED ORDER — FENTANYL CITRATE (PF) 250 MCG/5ML IJ SOLN
INTRAMUSCULAR | Status: AC
  Filled 2019-07-06: qty 5

## 2019-07-06 MED ORDER — MIDAZOLAM HCL 2 MG/2ML IJ SOLN
INTRAMUSCULAR | Status: AC
  Filled 2019-07-06: qty 2

## 2019-07-06 MED ORDER — GABAPENTIN 400 MG PO CAPS
1200.0000 mg | ORAL_CAPSULE | Freq: Every day | ORAL | Status: DC
  Administered 2019-07-06 – 2019-07-07 (×2): 1200 mg via ORAL
  Filled 2019-07-06 (×2): qty 3

## 2019-07-06 MED ORDER — ESCITALOPRAM OXALATE 10 MG PO TABS
20.0000 mg | ORAL_TABLET | Freq: Every day | ORAL | Status: DC
  Administered 2019-07-07 – 2019-07-08 (×2): 20 mg via ORAL
  Filled 2019-07-06 (×2): qty 2

## 2019-07-06 MED ORDER — INSULIN ASPART 100 UNIT/ML ~~LOC~~ SOLN
0.0000 [IU] | Freq: Three times a day (TID) | SUBCUTANEOUS | Status: DC
  Administered 2019-07-06 – 2019-07-07 (×3): 2 [IU] via SUBCUTANEOUS
  Administered 2019-07-07 (×2): 4 [IU] via SUBCUTANEOUS
  Administered 2019-07-08 (×2): 8 [IU] via SUBCUTANEOUS

## 2019-07-06 MED ORDER — HYDROCODONE-ACETAMINOPHEN 5-325 MG PO TABS: 1.0000 | ORAL_TABLET | Freq: Four times a day (QID) | ORAL | Status: DC | PRN

## 2019-07-06 MED ORDER — PROPOFOL 500 MG/50ML IV EMUL
INTRAVENOUS | Status: DC | PRN
  Administered 2019-07-06: 09:00:00 via INTRAVENOUS
  Administered 2019-07-06: 10 ug/kg/min via INTRAVENOUS

## 2019-07-06 MED ORDER — ONDANSETRON HCL 4 MG/2ML IJ SOLN
INTRAMUSCULAR | Status: AC
  Filled 2019-07-06: qty 2

## 2019-07-06 MED ORDER — ONDANSETRON HCL 4 MG/2ML IJ SOLN
INTRAMUSCULAR | Status: DC | PRN
  Administered 2019-07-06: 4 mg via INTRAVENOUS

## 2019-07-06 MED ORDER — ONDANSETRON HCL 4 MG/2ML IJ SOLN: 4.0000 mg | Freq: Four times a day (QID) | INTRAMUSCULAR | Status: DC | PRN

## 2019-07-06 MED ORDER — PROPOFOL 500 MG/50ML IV EMUL
INTRAVENOUS | Status: AC
  Filled 2019-07-06: qty 100

## 2019-07-06 MED ORDER — VANCOMYCIN HCL IN DEXTROSE 1-5 GM/200ML-% IV SOLN
1000.0000 mg | Freq: Once | INTRAVENOUS | Status: AC
  Administered 2019-07-06: 1000 mg via INTRAVENOUS
  Filled 2019-07-06: qty 200

## 2019-07-06 MED ORDER — CHLORHEXIDINE GLUCONATE CLOTH 2 % EX PADS: 6.0000 | MEDICATED_PAD | Freq: Every day | CUTANEOUS | Status: DC

## 2019-07-06 MED ORDER — SODIUM CHLORIDE 0.9 % IV SOLN
80.0000 mg | INTRAVENOUS | Status: AC
  Administered 2019-07-07: 80 mg
  Filled 2019-07-06 (×2): qty 2

## 2019-07-06 MED ORDER — PHENYLEPHRINE HCL-NACL 20-0.9 MG/250ML-% IV SOLN
0.0000 ug/min | INTRAVENOUS | Status: DC
  Filled 2019-07-06: qty 250

## 2019-07-06 MED ORDER — SILVER SULFADIAZINE 1 % EX CREA
TOPICAL_CREAM | Freq: Two times a day (BID) | CUTANEOUS | Status: DC
  Administered 2019-07-06 – 2019-07-08 (×4): via TOPICAL
  Filled 2019-07-06 (×2): qty 85

## 2019-07-06 MED ORDER — SODIUM CHLORIDE 0.9 % IV SOLN
INTRAVENOUS | Status: DC | PRN
  Administered 2019-07-06: 1500 mL via INTRAMUSCULAR

## 2019-07-06 MED ORDER — HEPARIN SODIUM (PORCINE) 1000 UNIT/ML IJ SOLN
INTRAMUSCULAR | Status: DC | PRN
  Administered 2019-07-06: 25000 [IU] via INTRAVENOUS

## 2019-07-06 MED ORDER — SODIUM CHLORIDE 0.9 % IV SOLN
INTRAVENOUS | Status: AC
  Administered 2019-07-06: 50 mL/h via INTRAVENOUS

## 2019-07-06 MED ORDER — CLOPIDOGREL BISULFATE 75 MG PO TABS
75.0000 mg | ORAL_TABLET | Freq: Every day | ORAL | Status: DC
  Administered 2019-07-07: 75 mg via ORAL
  Filled 2019-07-06: qty 1

## 2019-07-06 MED ORDER — CHLORHEXIDINE GLUCONATE 4 % EX LIQD: 30.0000 mL | CUTANEOUS | Status: DC

## 2019-07-06 MED ORDER — SODIUM CHLORIDE 0.9% FLUSH
3.0000 mL | Freq: Two times a day (BID) | INTRAVENOUS | Status: DC
  Administered 2019-07-06: 3 mL via INTRAVENOUS

## 2019-07-06 MED ORDER — ATORVASTATIN CALCIUM 40 MG PO TABS
40.0000 mg | ORAL_TABLET | Freq: Every day | ORAL | Status: DC
  Administered 2019-07-06 – 2019-07-08 (×3): 40 mg via ORAL
  Filled 2019-07-06 (×3): qty 1

## 2019-07-06 MED ORDER — VANCOMYCIN HCL 10 G IV SOLR
1500.0000 mg | INTRAVENOUS | Status: AC
  Administered 2019-07-07: 1500 mg via INTRAVENOUS
  Filled 2019-07-06 (×2): qty 1500

## 2019-07-06 SURGICAL SUPPLY — 83 items
BAG DECANTER FOR FLEXI CONT (MISCELLANEOUS) ×4 IMPLANT
BAG SNAP BAND KOVER 36X36 (MISCELLANEOUS) ×4 IMPLANT
BIOPATCH RED 1 DISK 7.0 (GAUZE/BANDAGES/DRESSINGS) ×3 IMPLANT
BIOPATCH RED 1IN DISK 7.0MM (GAUZE/BANDAGES/DRESSINGS) ×1
BLADE CLIPPER SURG (BLADE) ×4 IMPLANT
BLADE OSCILLATING /SAGITTAL (BLADE) IMPLANT
BLADE STERNUM SYSTEM 6 (BLADE) IMPLANT
BLADE SURG 10 STRL SS (BLADE) IMPLANT
CABLE ADAPT CONN TEMP 6FT (ADAPTER) ×4 IMPLANT
CATH DIAG EXPO 6F AL1 (CATHETERS) IMPLANT
CATH DIAG EXPO 6F VENT PIG 145 (CATHETERS) ×8 IMPLANT
CATH EXTERNAL FEMALE PUREWICK (CATHETERS) IMPLANT
CATH INFINITI 6F AL2 (CATHETERS) IMPLANT
CATH S G BIP PACING (CATHETERS) ×4 IMPLANT
CHLORAPREP W/TINT 26 (MISCELLANEOUS) ×4 IMPLANT
CLIP VESOCCLUDE MED 24/CT (CLIP) IMPLANT
CLIP VESOCCLUDE SM WIDE 24/CT (CLIP) IMPLANT
CLOSURE MYNX CONTROL 6F/7F (Vascular Products) ×4 IMPLANT
CONT SPEC 4OZ CLIKSEAL STRL BL (MISCELLANEOUS) ×8 IMPLANT
COVER BACK TABLE 80X110 HD (DRAPES) ×4 IMPLANT
DERMABOND ADVANCED (GAUZE/BANDAGES/DRESSINGS) ×2
DERMABOND ADVANCED .7 DNX12 (GAUZE/BANDAGES/DRESSINGS) ×2 IMPLANT
DEVICE CLOSURE PERCLS PRGLD 6F (VASCULAR PRODUCTS) ×4 IMPLANT
DRAPE INCISE IOBAN 66X45 STRL (DRAPES) IMPLANT
DRSG TEGADERM 4X4.75 (GAUZE/BANDAGES/DRESSINGS) ×8 IMPLANT
ELECT CAUTERY BLADE 6.4 (BLADE) IMPLANT
ELECT REM PT RETURN 9FT ADLT (ELECTROSURGICAL) ×8
ELECTRODE REM PT RTRN 9FT ADLT (ELECTROSURGICAL) ×4 IMPLANT
FELT TEFLON 6X6 (MISCELLANEOUS) IMPLANT
GAUZE SPONGE 2X2 8PLY STRL LF (GAUZE/BANDAGES/DRESSINGS) ×4 IMPLANT
GAUZE SPONGE 4X4 12PLY STRL (GAUZE/BANDAGES/DRESSINGS) ×4 IMPLANT
GLOVE BIO SURGEON STRL SZ7.5 (GLOVE) ×4 IMPLANT
GLOVE BIO SURGEON STRL SZ8 (GLOVE) IMPLANT
GLOVE EUDERMIC 7 POWDERFREE (GLOVE) IMPLANT
GLOVE ORTHO TXT STRL SZ7.5 (GLOVE) IMPLANT
GOWN STRL REUS W/ TWL LRG LVL3 (GOWN DISPOSABLE) IMPLANT
GOWN STRL REUS W/ TWL XL LVL3 (GOWN DISPOSABLE) ×2 IMPLANT
GOWN STRL REUS W/TWL LRG LVL3 (GOWN DISPOSABLE)
GOWN STRL REUS W/TWL XL LVL3 (GOWN DISPOSABLE) ×2
GUIDEWIRE SAFE TJ AMPLATZ EXST (WIRE) ×4 IMPLANT
INSERT FOGARTY SM (MISCELLANEOUS) IMPLANT
KIT BASIN OR (CUSTOM PROCEDURE TRAY) ×4 IMPLANT
KIT HEART LEFT (KITS) ×4 IMPLANT
KIT SUCTION CATH 14FR (SUCTIONS) IMPLANT
KIT TURNOVER KIT B (KITS) ×4 IMPLANT
LOOP VESSEL MAXI BLUE (MISCELLANEOUS) IMPLANT
LOOP VESSEL MINI RED (MISCELLANEOUS) IMPLANT
NS IRRIG 1000ML POUR BTL (IV SOLUTION) ×4 IMPLANT
PACK ENDO MINOR (CUSTOM PROCEDURE TRAY) ×4 IMPLANT
PAD ARMBOARD 7.5X6 YLW CONV (MISCELLANEOUS) ×8 IMPLANT
PAD ELECT DEFIB RADIOL ZOLL (MISCELLANEOUS) ×4 IMPLANT
PENCIL BUTTON HOLSTER BLD 10FT (ELECTRODE) IMPLANT
PERCLOSE PROGLIDE 6F (VASCULAR PRODUCTS) ×8
POSITIONER HEAD DONUT 9IN (MISCELLANEOUS) ×4 IMPLANT
SET MICROPUNCTURE 5F STIFF (MISCELLANEOUS) ×4 IMPLANT
SHEATH BRITE TIP 7FR 35CM (SHEATH) ×4 IMPLANT
SHEATH PINNACLE 6F 10CM (SHEATH) ×4 IMPLANT
SHEATH PINNACLE 8F 10CM (SHEATH) ×4 IMPLANT
SHEATH PINNACLE MP 7F 45CM (SHEATH) ×4 IMPLANT
SLEEVE REPOSITIONING LENGTH 30 (MISCELLANEOUS) ×4 IMPLANT
SPONGE GAUZE 2X2 STER 10/PKG (GAUZE/BANDAGES/DRESSINGS) ×4
SPONGE LAP 18X18 X RAY DECT (DISPOSABLE) ×4 IMPLANT
STOPCOCK MORSE 400PSI 3WAY (MISCELLANEOUS) ×8 IMPLANT
SUT ETHIBOND X763 2 0 SH 1 (SUTURE) IMPLANT
SUT GORETEX CV 4 TH 22 36 (SUTURE) IMPLANT
SUT GORETEX CV4 TH-18 (SUTURE) IMPLANT
SUT MNCRL AB 3-0 PS2 18 (SUTURE) IMPLANT
SUT PROLENE 5 0 C 1 36 (SUTURE) IMPLANT
SUT PROLENE 6 0 C 1 30 (SUTURE) IMPLANT
SUT SILK  1 MH (SUTURE) ×4
SUT SILK 1 MH (SUTURE) ×4 IMPLANT
SUT VIC AB 2-0 CT1 27 (SUTURE)
SUT VIC AB 2-0 CT1 TAPERPNT 27 (SUTURE) IMPLANT
SUT VIC AB 2-0 CTX 36 (SUTURE) IMPLANT
SUT VIC AB 3-0 SH 8-18 (SUTURE) IMPLANT
SYR 50ML LL SCALE MARK (SYRINGE) ×4 IMPLANT
SYR BULB IRRIGATION 50ML (SYRINGE) IMPLANT
SYR MEDRAD MARK V 150ML (SYRINGE) ×4 IMPLANT
TOWEL GREEN STERILE (TOWEL DISPOSABLE) ×8 IMPLANT
TRANSDUCER W/STOPCOCK (MISCELLANEOUS) ×8 IMPLANT
VALVE HEART TRANSCATH SZ3 29MM (Valve) ×4 IMPLANT
WIRE EMERALD 3MM-J .035X150CM (WIRE) ×4 IMPLANT
WIRE EMERALD 3MM-J .035X260CM (WIRE) ×4 IMPLANT

## 2019-07-06 NOTE — Op Note (Addendum)
[facility]   MULTIDISCIPLINARY HEART VALVE TEAM   TAVR OPERATIVE NOTE   Date of Procedure:  07/06/2019  Preoperative Diagnosis: Severe Aortic Stenosis   Postoperative Diagnosis: Same   Procedure:    Transcatheter Aortic Valve Replacement - Percutaneous Right Transfemoral Approach  Edwards Sapien 3 THV (size 29 mm, model # 9600TFX, serial # [id number])   Co-Surgeons:  Alleen Borne, MD and Verne Carrow, MD   Anesthesiologist:  Arta Bruce, MD.  Echocardiographer:  Charlton Haws, MD.  Pre-operative Echo Findings:  Severe aortic stenosis  Moderate left ventricular systolic function  Post-operative Echo Findings:  No paravalvular leak  Moderate left ventricular systolic function   BRIEF CLINICAL NOTE AND INDICATIONS FOR SURGERY  Patient has stage D severe symptomatic aortic stenosis. He suffers from chronic symptoms of exertional shortness of breath, fatigue, and lower extremity edema consistent with chronic combined systolic and diastolic congestive heart failure, New York Heart Association functional class IIIb. Over the past year he has had 2 hospitalizations for acute exacerbations with class IV symptoms and pulmonary edema, most recently just 2 weeks ago. I have personally reviewed the patient's most recent transthoracic echocardiogram, diagnostic cardiac catheterization, and CT angiograms. Echocardiogram confirms the presence of severe aortic stenosis. The aortic valve is trileaflet with severe thickening, calcification, and restricted leaflet mobility involving all 3 leaflets of the aortic valve. Peak velocity across aortic valve measured 3.6 m/s corresponding to mean transvalvular gradient estimated 35 mmHg in the setting of severe left ventricular systolic dysfunction with ejection fraction only 30 to 35%. DVI was notably quite low at 0.20 and aortic valve area calculated only 0.84 cm. Diagnostic cardiac catheterization performed January 2020  revealed moderate nonobstructive coronary artery disease. I agree the patient would benefit from aortic valve replacement. Risks associated with conventional surgery would be somewhat elevated because of the patient's extreme morbid obesity and numerous other comorbid medical problems. Cardiac-gated CTA of the heart reveals anatomical characteristics consistent with aortic stenosis suitable for treatment by transcatheter aortic valve replacement without any significant complicating features. The gated scan was of marginal quality and there is some question regarding the absolute size of the patient's aortic root. However, I have personally reviewed the images and feel that the patient's valve can be treated using a 29 mm Edwards's Sapien 3 transcatheter heart valve without unusual risk. CTA of the aorta and iliac vessels demonstrate what appears to be adequate pelvic vascular access to facilitate a transfemoral approach. The patient does have right bundle branch block at baseline and would be at somewhat increased risk for need for permanent pacemaker placement.   The patient and his wife were counseled at length regarding treatment alternatives for management of severe symptomatic aortic stenosis. Alternative approaches such as conventional aortic valve replacement, transcatheter aortic valve replacement, and continued medical therapy without intervention were compared and contrasted at length. The risks associated with conventional surgical aortic valve replacement were discussed in detail, as were expectations for post-operative convalescence, and why I would be reluctant to consider this patient a candidate for conventional surgery. Issues specific to transcatheter aortic valve replacement were discussed including questions about long term valve durability, the potential for paravalvular leak, possible increased risk of need for permanent pacemaker placement, and other technical complications related to the  procedure itself. Long-term prognosis with medical therapy was discussed. This discussion was placed in the context of the patient's own specific clinical presentation and past medical history. All of their questions have been addressed. The patient desires  to proceed with transcatheter aortic valve replacement on July 06, 2019. Because of the patient's ongoing symptoms of urinary frequency and urgency and his history of urinary retention during his recent hospitalization a repeat UA was performed which was unremarkable and urine culture was negative.  Following the decision to proceed with transcatheter aortic valve replacement, a discussion has been held regarding what types of management strategies would be attempted intraoperatively in the event of life-threatening complications, including whether or not the patient would be considered a candidate for the use of cardiopulmonary bypass and/or conversion to open sternotomy for attempted surgical intervention. The patient specifically requests that should a potentially life-threatening complication develop we would attempt emergency median sternotomy and/or other aggressive surgical procedures. The patient has been advised of a variety of complications that might develop including but not limited to risks of death, stroke, paravalvular leak, aortic dissection or other major vascular complications, aortic annulus rupture, device embolization, cardiac rupture or perforation, mitral regurgitation, acute myocardial infarction, arrhythmia, heart block or bradycardia requiring permanent pacemaker placement, congestive heart failure, respiratory failure, renal failure, pneumonia, infection, other late complications related to structural valve deterioration or migration, or other complications that might ultimately cause a temporary or permanent loss of functional independence or other long term morbidity. The patient provides full informed consent for the procedure  as described and all questions were answered.    DETAILS OF THE OPERATIVE PROCEDURE  PREPARATION:    The patient is brought to the operating room on the above mentioned date and appropriate monitoring was established by the anesthesia team. The patient is placed in the supine position on the operating table.  Intravenous antibiotics are administered. The patient is monitored closely throughout the procedure under conscious sedation.   Baseline transthoracic echocardiogram was performed. The patient's abdomen and  both groins are prepared and draped in a sterile manner. A time out procedure is performed.   PERIPHERAL ACCESS:    Using the modified Seldinger technique, femoral arterial and venous access was obtained with placement of 6 Fr sheaths on the left side.  A pigtail diagnostic catheter was passed through the left arterial sheath under fluoroscopic guidance into the aortic root.  A temporary transvenous pacemaker catheter was passed through the left femoral venous sheath under fluoroscopic guidance into the right ventricle.  The pacemaker was tested to ensure stable lead placement and pacemaker capture. Aortic root angiography was performed in order to determine the optimal angiographic angle for valve deployment.   TRANSFEMORAL ACCESS:   Percutaneous transfemoral access and sheath placement was performed using ultrasound guidance.  The right common femoral artery was cannulated using a micropuncture needle and appropriate location was verified using hand injection angiogram.  A pair of Abbott Perclose percutaneous closure devices were placed and a 6 French sheath replaced into the femoral artery.  The patient was heparinized systemically and ACT verified > 250 seconds.    A 16 Fr transfemoral E-sheath was introduced into the right common femoral artery after progressively dilating over an Amplatz superstiff wire. An AL-2 catheter was used to direct a straight-tip exchange length wire  across the native aortic valve into the left ventricle. He developed complete heart block after the wire was advanced into the LV and the temporary pacer was used to pace the ventricle. The AL-2 was exchanged out for a pigtail catheter and position was confirmed in the LV apex. Simultaneous LV and Ao pressures were recorded.  The pigtail catheter was exchanged for an Amplatz Extra-stiff wire in  the LV apex.     BALLOON AORTIC VALVULOPLASTY:   Not performed  TRANSCATHETER HEART VALVE DEPLOYMENT:   An Edwards Sapien 3 transcatheter heart valve (size 29 mm, model #9600TFX, serial #[id number]) was prepared and crimped per manufacturer's guidelines, and the proper orientation of the valve is confirmed on the [employer] delivery system. The valve was advanced through the introducer sheath using normal technique until in an appropriate position in the abdominal aorta beyond the sheath tip. The balloon was then retracted and using the fine-tuning wheel was centered on the valve. The valve was then advanced across the aortic arch using appropriate flexion of the catheter. The valve was carefully positioned across the aortic valve annulus. The Commander catheter was retracted using normal technique. Once final position of the valve has been confirmed by angiographic assessment, the valve is deployed while temporarily holding ventilation and during rapid ventricular pacing to maintain systolic blood pressure < 50 mmHg and pulse pressure < 10 mmHg. The balloon inflation is held for >3 seconds after reaching full deployment volume. Once the balloon has fully deflated the balloon is retracted into the ascending aorta and valve function is assessed using echocardiography. There is felt to be no paravalvular leak and no central aortic insufficiency.  The patient's hemodynamic recovery following valve deployment is good.  The deployment balloon and guidewire are both removed.    PROCEDURE COMPLETION:   The sheath  was removed and femoral artery closure performed.  Protamine was administered once femoral arterial repair was complete. The  pigtail catheter and femoral arterial sheath were removed with manual pressure used for hemostasis.  A Mynx femoral closure device was utilized following removal of the diagnostic sheath in the left femoral artery. The femoral venous sheath and pacemaker wire were left in place with the patient pacing at 80.  The patient tolerated the procedure well and is transported to the cath lab recovery area in stable condition. There were no immediate intraoperative complications. All sponge, instrument and needle counts are verified correct at completion of the operation.   No blood products were administered during the operation.  The patient received a total of 40 mL of intravenous contrast during the procedure.   Gaye Pollack, MD 07/06/2019 1:59 PM

## 2019-07-06 NOTE — Anesthesia Postprocedure Evaluation (Signed)
Anesthesia Post Note  Patient: Daniel Valenzuela  Procedure(s) Performed: TRANSCATHETER AORTIC VALVE REPLACEMENT, TRANSFEMORAL (N/A Chest) TRANSESOPHAGEAL ECHOCARDIOGRAM (TEE) (N/A )     Patient location during evaluation: PACU Anesthesia Type: MAC Level of consciousness: awake and alert Pain management: pain level controlled Vital Signs Assessment: post-procedure vital signs reviewed and stable Respiratory status: spontaneous breathing, nonlabored ventilation, respiratory function stable and patient connected to nasal cannula oxygen Cardiovascular status: stable and blood pressure returned to baseline Postop Assessment: no apparent nausea or vomiting Anesthetic complications: no    Last Vitals:  Vitals:   07/06/19 1430 07/06/19 1445  BP: (!) 96/49   Pulse:    Resp: 10 11  Temp:    SpO2: 93% 97%    Last Pain:  Vitals:   07/06/19 0611  TempSrc: Oral  PainSc: 0-No pain                 Barrett Goldie Shalon

## 2019-07-06 NOTE — Anesthesia Procedure Notes (Signed)
Arterial Line Insertion Start/End12/02/2019 7:10 AM Performed by: Candis Shine, CRNA, CRNA  Patient location: Pre-op. Preanesthetic checklist: patient identified, IV checked, site marked, risks and benefits discussed, surgical consent, monitors and equipment checked, pre-op evaluation, timeout performed and anesthesia consent Lidocaine 1% used for infiltration Right, radial was placed Catheter size: 20 Fr Hand hygiene performed  and maximum sterile barriers used   Attempts: 2 Procedure performed without using ultrasound guided technique. Following insertion, dressing applied and Biopatch. Post procedure assessment: normal and unchanged  Patient tolerated the procedure well with no immediate complications.

## 2019-07-06 NOTE — Progress Notes (Signed)
Orthopedic Tech Progress Note Patient Details:  Daniel Valenzuela 21-Sep-1950 [id number]  Ortho Devices Type of Ortho Device: Knee Immobilizer Ortho Device/Splint Location: LLE Ortho Device/Splint Interventions: Ordered, Application   Post Interventions Patient Tolerated: Well Instructions Provided: Care of device   [name] 07/06/2019, 12:55 PM

## 2019-07-06 NOTE — Anesthesia Procedure Notes (Signed)
Procedure Name: MAC Date/Time: 07/06/2019 7:34 AM Performed by: Candis Shine, CRNA Pre-anesthesia Checklist: Emergency Drugs available, Patient identified, Suction available, Patient being monitored and Timeout performed Patient Re-evaluated:Patient Re-evaluated prior to induction Oxygen Delivery Method: Simple face mask Dental Injury: Teeth and Oropharynx as per pre-operative assessment

## 2019-07-06 NOTE — Anesthesia Preprocedure Evaluation (Signed)
Anesthesia Evaluation  Patient identified by MRN, date of birth, ID band Patient awake    Reviewed: Allergy & Precautions, NPO status , Patient's Chart, lab work & pertinent test results  Airway Mallampati: II  TM Distance: >3 FB Neck ROM: Full    Dental   Pulmonary sleep apnea ,    Pulmonary exam normal        Cardiovascular hypertension, Pt. on medications Normal cardiovascular exam+ Valvular Problems/Murmurs AS      Neuro/Psych Depression    GI/Hepatic GERD  Medicated and Controlled,  Endo/Other  diabetes, Type 2, Oral Hypoglycemic Agents  Renal/GU      Musculoskeletal   Abdominal   Peds  Hematology   Anesthesia Other Findings   Reproductive/Obstetrics                             Anesthesia Physical Anesthesia Plan  ASA: III  Anesthesia Plan: MAC   Post-op Pain Management:    Induction: Intravenous  PONV Risk Score and Plan: Ondansetron  Airway Management Planned: Simple Face Mask  Additional Equipment: Arterial line  Intra-op Plan:   Post-operative Plan:   Informed Consent: I have reviewed the patients History and Physical, chart, labs and discussed the procedure including the risks, benefits and alternatives for the proposed anesthesia with the patient or authorized representative who has indicated his/her understanding and acceptance.       Plan Discussed with: CRNA and Surgeon  Anesthesia Plan Comments:         Anesthesia Quick Evaluation

## 2019-07-06 NOTE — Interval H&P Note (Signed)
History and Physical Interval Note:  07/06/2019 7:01 AM  Daniel Valenzuela  has presented today for surgery, with the diagnosis of Severe Aortic Stenosis.  The various methods of treatment have been discussed with the patient and family. After consideration of risks, benefits and other options for treatment, the patient has consented to  Procedure(s): TRANSCATHETER AORTIC VALVE REPLACEMENT, TRANSFEMORAL (N/A) TRANSESOPHAGEAL ECHOCARDIOGRAM (TEE) (N/A) as a surgical intervention.  The patient's history has been reviewed, patient examined, no change in status, stable for surgery.  I have reviewed the patient's chart and labs.  Questions were answered to the patient's satisfaction.     Gaye Pollack

## 2019-07-06 NOTE — Progress Notes (Signed)
  Echocardiogram 2D Echocardiogram has been performed.  Daniel Valenzuela 07/06/2019, 9:41 AM

## 2019-07-06 NOTE — CV Procedure (Signed)
[facility]  [facility]AVR OPERATIVE NOTE   Date of Procedure:  07/06/2019  Preoperative Diagnosis: Severe Aortic Stenosis   Postoperative Diagnosis: Same   Procedure:    Transcatheter Aortic Valve Replacement - Transfemoral Approach  Edwards Sapien 3 THV (size 29 mm, model # [id number], serial # [id number])   Co-Surgeons:  Lauree Chandler, MD and Gaye Pollack, MD   Anesthesiologist:  Conrad [place]  Echocardiographer:  Johnsie Cancel  Pre-operative Echo Findings:  Severe aortic stenosis  Moderate left ventricular systolic dysfunction  Post-operative Echo Findings:  No paravalvular leak  Moderate  left ventricular systolic function  BRIEF CLINICAL NOTE AND INDICATIONS FOR SURGERY  Patient is a 68 year old morbidly obese male with history of aortic stenosis, hypertension, insulin-dependent type 2 diabetes mellitus with complications, obstructive sleep apnea on CPAP, chronic right bundle branch block and first-degree AV block, syncope, GE reflux disease, and chronic combined systolic and diastolic congestive heart failure who is here today for TAVR. Patient's cardiac history dates back to February 2019 when he was admitted following a syncopal episode. Etiology was eventually attributed to vasovagal symptoms and have not recurred since verapamil was discontinued, but the patient underwent loop recorder implantation because of underlying right bundle branch block with long first-degree AV block. Echocardiogram at that time revealed normal left ventricular systolic function with mild aortic stenosis. He has been followed intermittently ever since by Dr. Lovena Le and has never had any evidence of syncope or significant advanced conduction system disease based upon loop recorder.  Patient was hospitalized in January 2020 with acute exacerbation of chronic congestive heart failure and massive volume overload. Echocardiogram performed at that time revealed significant left ventricular systolic  dysfunction with ejection fraction estimated only 35 to 40%. There was felt to be moderate to severe aortic stenosis with mean transvalvular gradient estimated 28 mmHg and aortic valve area calculated 1.34 cm. The patient underwent diagnostic cardiac catheterization which revealed mild to moderate nonobstructive coronary artery disease with moderate aortic stenosis based on mean transvalvular gradient measured 23 mmHg at catheterization and aortic valve area calculated 1.4 cm. The patient was recently readmitted to the hospital earlier this month with another acute exacerbation of chronic congestive heart failure and pulmonary edema. Follow-up echocardiogram revealed left ventricular ejection fraction down further to 30-35%. Despite this there was evidence of severe aortic stenosis with mean transvalvular gradient estimated 35 mmHg, DVI reported 0.20 and aortic valve area calculated only 0.83 cm.   During the course of the patient's preoperative work up they have been evaluated comprehensively by a multidisciplinary team of specialists coordinated through the [facility] in the [facility].  They have been demonstrated to suffer from symptomatic severe aortic stenosis as noted above. The patient has been counseled extensively as to the relative risks and benefits of all options for the treatment of severe aortic stenosis including long term medical therapy, conventional surgery for aortic valve replacement, and transcatheter aortic valve replacement.  The patient has been independently evaluated by Dr. Cyndia Bent with CT surgery and they are felt to be at high risk for conventional surgical aortic valve replacement. The surgeon indicated the patient would be a poor candidate for conventional surgery. Based upon review of all of the patient's preoperative diagnostic tests they are felt to be candidate for transcatheter aortic valve replacement using the transfemoral  approach as an alternative to high risk conventional surgery.    Following the decision to proceed with transcatheter aortic valve replacement, a discussion has  been held regarding what types of management strategies would be attempted intraoperatively in the event of life-threatening complications, including whether or not the patient would be considered a candidate for the use of cardiopulmonary bypass and/or conversion to open sternotomy for attempted surgical intervention.  The patient has been advised of a variety of complications that might develop peculiar to this approach including but not limited to risks of death, stroke, paravalvular leak, aortic dissection or other major vascular complications, aortic annulus rupture, device embolization, cardiac rupture or perforation, acute myocardial infarction, arrhythmia, heart block or bradycardia requiring permanent pacemaker placement, congestive heart failure, respiratory failure, renal failure, pneumonia, infection, other late complications related to structural valve deterioration or migration, or other complications that might ultimately cause a temporary or permanent loss of functional independence or other long term morbidity.  The patient provides full informed consent for the procedure as described and all questions were answered preoperatively.    DETAILS OF THE OPERATIVE PROCEDURE  PREPARATION:   The patient is brought to the operating room on the above mentioned date and central monitoring was established by the anesthesia team including placement of a radial arterial line. The patient is placed in the supine position on the operating table.  Intravenous antibiotics are administered. Conscious sedation is used.   Baseline transthoracic echocardiogram was performed. The patient's chest, abdomen, both groins, and both lower extremities are prepared and draped in a sterile manner. A time out procedure is performed.   PERIPHERAL ACCESS:    Using the modified Seldinger technique, femoral arterial and venous access were obtained with placement of 6 Fr sheaths on the left side using u/s guidance.  A pigtail diagnostic catheter was passed through the femoral arterial sheath under fluoroscopic guidance into the aortic root.  A temporary transvenous pacemaker catheter was passed through the femoral venous sheath under fluoroscopic guidance into the right ventricle.  The pacemaker was tested to ensure stable lead placement and pacemaker capture. Aortic root angiography was performed in order to determine the optimal angiographic angle for valve deployment.  TRANSFEMORAL ACCESS:  A micropuncture kit was used to gain access to the right femoral artery using u/s guidance. Position confirmed with angiography. Pre-closure with double ProGlide closure devices. The patient was heparinized systemically and ACT verified > 250 seconds.    A 16 Fr transfemoral E-sheath was introduced into the right femoral artery after progressively dilating over an Amplatz superstiff wire. An AL-2 catheter was used to direct a straight-tip exchange length wire across the native aortic valve into the left ventricle. This was exchanged out for a pigtail catheter and position was confirmed in the LV apex. Simultaneous LV and Ao pressures were recorded.  The pigtail catheter was then exchanged for an Amplatz Extra-stiff wire in the LV apex.   TRANSCATHETER HEART VALVE DEPLOYMENT:  An Edwards Sapien 3 THV (size 29 mm) was prepared and crimped per manufacturer's guidelines, and the proper orientation of the valve is confirmed on the [employer] delivery system. The valve was advanced through the introducer sheath using normal technique until in an appropriate position in the abdominal aorta beyond the sheath tip. The balloon was then retracted and using the fine-tuning wheel was centered on the valve. The valve was then advanced across the aortic arch using appropriate  flexion of the catheter. The valve was carefully positioned across the aortic valve annulus. The Commander catheter was retracted using normal technique. Once final position of the valve has been confirmed by angiographic assessment, the valve is  deployed while temporarily holding ventilation and during rapid ventricular pacing to maintain systolic blood pressure < 50 mmHg and pulse pressure < 10 mmHg. The balloon inflation is held for >3 seconds after reaching full deployment volume. Once the balloon has fully deflated the balloon is retracted into the ascending aorta and valve function is assessed using TTE. There is felt to be no paravalvular leak and no central aortic insufficiency.  The patient's hemodynamic recovery following valve deployment is good.  The deployment balloon and guidewire are both removed. Echo demostrated acceptable post-procedural gradients, stable mitral valve function, and no AI.   PROCEDURE COMPLETION:  The sheath was then removed and closure devices were completed. Protamine was administered once femoral arterial repair was complete. The temporary pacemaker was left in place for complete heart block. The pigtail catheter and arterial femoral sheath was removed. Mynx closure device placed in the artery.    The patient tolerated the procedure well and is transported to the surgical intensive care in stable condition. There were no immediate intraoperative complications. All sponge instrument and needle counts are verified correct at completion of the operation.   No blood products were administered during the operation.  The patient received a total of 40 mL of intravenous contrast during the procedure.  Lauree Chandler MD 07/06/2019 10:02 AM

## 2019-07-06 NOTE — Consult Note (Addendum)
Cardiology Consultation:   Patient ID: [name] MRN: [id number]; DOB: November 22, 1950  Admit date: 07/06/2019 Date of Consult: 07/06/2019  Primary Care Provider: Carylon Perches, MD Primary Cardiologist: Lewayne Bunting, MD  Primary Electrophysiologist:  Lewayne Bunting, MD    Patient Profile:   Daniel Valenzuela is a 68 y.o. male with a hx of Morbid obesity (s/p gastric banding 2009, BMI currently 51.93), HTN, IDDM (II), OSA (not onCPAP), GERD, chronic CHF (combined), syncope (suspect to be vagal at the time, has loop without arrhythmias or brady), RBBBand more recently VHD w/AS who is being seen today for the evaluation of CHB inter-op TAVR procedure at the request of Dr. Clifton Bernal Luhman.  History of Present Illness:   Mr. Romm of late has had worsening DOE was admitted from the office last month with volume OL, at this time his AS felt to have advanced and referred to structural heart, Dr. Clifton Seif Teichert consulted and planned for TAVR  He comes today for elective TAVR, pre-op he had RBBB, long 1st degree AVblock.  Intra procedure he developed CHB supported by transvenous temp pacing wire.  EP is asked on-board, felt likely to require PPM given baseline conduction system disease.  LABS K+ 3.4 BUN/Creat 11/0.70 H/H 12/38  Home meds Toprol XL 25mg  daily (not ordered for here) I do not appreciate any other potential nodal blocking agents  Post procedure is on low dose levophed that has since been stopped   Heart Pathway Score:     Past Medical History:  Diagnosis Date  . Aortic stenosis, moderate 07/2018   felt to be moderate at cath Jan 2020  . CAD (coronary artery disease) 07/2018   mild non obstructive  . Chronic back pain    "mainly lower right now" (08/12/2018)  . Chronic combined systolic and diastolic heart failure (HCC)   . Depression   . Diabetic neuropathy (HCC)   . First degree AV block   . GERD (gastroesophageal reflux disease)   . High cholesterol   . Hypertension   . Morbid  obesity with body mass index of 50 or higher (HCC)   . NICM (nonischemic cardiomyopathy) (HCC) 07/2018   EF 35-40%  . Pulmonary nodules    multiple small pulmonary nodules in right lung noted on pre tavr CT. non contrast CT follow up in 3-6 monhts  . RBBB   . Renal lesion    right renal lesion --> MRI recommended  . S/P TAVR (transcatheter aortic valve replacement)    a. s/p TAVR with a 29 mm Edwards Sapien 3 THV via the TF apprach on 07/06/19 wtih Dr. Aundra Dubin and Dr Lavinia Sharps  . Severe aortic stenosis    s/p tavr  . Sleep apnea    "lost weight; not on CPAP anymore" (08/12/2018)  . Spinal stenosis of lumbar region    "w/slipped disc" (08/12/2018)  . Syncope    Felt to be secondary to autonomic dysfunction-s/p Loop recorder Feb 2019  . Type II diabetes mellitus (HCC)     Past Surgical History:  Procedure Laterality Date  . HERNIA REPAIR    . LAPAROSCOPIC GASTRIC BANDING    . LOOP RECORDER INSERTION N/A 09/01/2017   Procedure: LOOP RECORDER INSERTION;  Surgeon: Duke Salvia, MD;  Location: [facility] INVASIVE CV LAB;  Service: Cardiovascular;  Laterality: N/A;  . RIGHT/LEFT HEART CATH AND CORONARY ANGIOGRAPHY N/A 08/14/2018   Procedure: RIGHT/LEFT HEART CATH AND CORONARY ANGIOGRAPHY;  Surgeon: Yvonne Kendall, MD;  Location: [facility] INVASIVE CV LAB;  Service: Cardiovascular;  Laterality: N/A;  . SHOULDER ARTHROSCOPY Right   . TONSILLECTOMY    . UMBILICAL HERNIA REPAIR       Home Medications:  Prior to Admission medications   Medication Sig Start Date End Date Taking? Authorizing Provider  atorvastatin (LIPITOR) 40 MG tablet Take 40 mg by mouth daily.    Yes [provider]  buPROPion (WELLBUTRIN XL) 300 MG 24 hr tablet Take 300 mg by mouth daily.  05/25/19  Yes [provider]  Calcium-Magnesium-Zinc (CAL-MAG-ZINC PO) Take 1 tablet by mouth daily with breakfast.   Yes [provider]  chlorhexidine (PERIDEX) 0.12 % solution Use as directed 15 mLs in the mouth or throat  2 (two) times daily. 06/10/19  Yes Eileen Stanford, PA-C  escitalopram (LEXAPRO) 20 MG tablet Take 20 mg by mouth daily. 04/07/19  Yes [provider]  furosemide (LASIX) 40 MG tablet Take 1.5 tablets (60 mg total) by mouth daily. 08/18/18  Yes Elgergawy, Silver Huguenin, MD  gabapentin (NEURONTIN) 600 MG tablet Take 600-1,200 mg by mouth See admin instructions. Take 1 tablet (600 mg) by mouth every morning & take 2 tablets (1200 mg) by mouth at bedtime.   Yes [provider]  HYDROcodone-acetaminophen (NORCO/VICODIN) 5-325 MG tablet Take 1 tablet by mouth 4 (four) times daily as needed (pain.).    Yes [provider]  insulin NPH Human (NOVOLIN N RELION) 100 UNIT/ML injection Inject 10-40 Units into the skin 2 (two) times daily. *Hold for blood glucose reading less than 140*   Yes [provider]  metoprolol succinate (TOPROL-XL) 25 MG 24 hr tablet Take 25 mg by mouth daily. 06/19/19  Yes [provider]  Multiple Vitamin (MULTIVITAMIN WITH MINERALS) TABS tablet Take 1 tablet by mouth daily. One-A-Day Multivitamin   Yes [provider]  pantoprazole (PROTONIX) 40 MG tablet Take 40 mg by mouth daily.    Yes [provider]  tamsulosin (FLOMAX) 0.4 MG CAPS capsule Take 1 capsule (0.4 mg total) by mouth daily after supper. 06/13/19  Yes Thornell Mule, MD  vitamin B-12 (CYANOCOBALAMIN) 1000 MCG tablet Take 1,000 mcg by mouth daily.   Yes [provider]  insulin regular (NOVOLIN R RELION) 100 units/mL injection Inject 0-20 Units into the skin See admin instructions. Sliding Scale Insulin two to three times a day before meals    [provider]  phenazopyridine (PYRIDIUM) 100 MG tablet Take 1 tablet (100 mg total) by mouth 3 (three) times daily with meals. Patient not taking: Reported on 06/18/2019 06/13/19   Thornell Mule, MD  potassium chloride SA (KLOR-CON) 20 MEQ tablet Take 1 tablet (20 mEq total) by mouth daily. Patient not  taking: Reported on 06/23/2019 06/14/19   Thornell Mule, MD    Inpatient Medications: Scheduled Meds: . [START ON 07/07/2019] aspirin  81 mg Oral Daily  . atorvastatin  40 mg Oral Daily  . [START ON 07/07/2019] buPROPion  300 mg Oral Daily  . [START ON 07/07/2019] clopidogrel  75 mg Oral Q breakfast  . [START ON 07/07/2019] escitalopram  20 mg Oral Daily  . gabapentin  600 mg Oral Daily   And  . gabapentin  1,200 mg Oral QHS  . insulin aspart  0-24 Units Subcutaneous TID AC & HS  . [START ON 07/07/2019] pantoprazole  40 mg Oral Daily  . sodium chloride flush  3 mL Intravenous Q12H  . tamsulosin  0.4 mg Oral QPC supper   Continuous Infusions: . sodium chloride 50 mL/hr (07/06/19 1049)  .  sodium chloride    . [START ON 07/07/2019] levofloxacin (LEVAQUIN) IV    . nitroGLYCERIN    . phenylephrine (NEO-SYNEPHRINE) Adult infusion    . cefUROXime (ZINACEF) 1.5 GM IVPB (Compounded or Mini-Bag Plus)    . vancomycin     PRN Meds: sodium chloride, acetaminophen **OR** acetaminophen, morphine injection, ondansetron (ZOFRAN) IV, sodium chloride flush, traMADol  Allergies:    Allergies  Allergen Reactions  . Methyldopa-Hydrochlorothiazide Other (See Comments)    GYNOCOMASTIA  . Motrin [Ibuprofen] Other (See Comments)    GYNOCOMASTIA  . Nsaids Other (See Comments)    Patient has had lap band surgery- cannot tolerate this class of meds  . Amoxicillin Rash    Did it involve swelling of the face/tongue/throat, SOB, or low BP? No Did it involve sudden or severe rash/hives, skin peeling, or any reaction on the inside of your mouth or nose? Yes Did you need to seek medical attention at a hospital or doctor's office? No When did it last happen?More than 30 years ago If all above answers are "NO", may proceed with cephalosporin use. .    Social History:   Social History   Socioeconomic History  . Marital status: Married    Spouse name: Not on file  . Number of children: Not on file   . Years of education: Not on file  . Highest education level: Not on file  Occupational History  . Not on file  Social Needs  . Financial resource strain: Not on file  . Food insecurity    Worry: Not on file    Inability: Not on file  . Transportation needs    Medical: Not on file    Non-medical: Not on file  Tobacco Use  . Smoking status: Never Smoker  . Smokeless tobacco: Never Used  Substance and Sexual Activity  . Alcohol use: No    Frequency: Never  . Drug use: Never  . Sexual activity: Not Currently  Lifestyle  . Physical activity    Days per week: Not on file    Minutes per session: Not on file  . Stress: Not on file  Relationships  . Social Musicianconnections    Talks on phone: Not on file    Gets together: Not on file    Attends religious service: Not on file    Active member of club or organization: Not on file    Attends meetings of clubs or organizations: Not on file    Relationship status: Not on file  . Intimate partner violence    Fear of current or ex partner: Not on file    Emotionally abused: Not on file    Physically abused: Not on file    Forced sexual activity: Not on file  Other Topics Concern  . Not on file  Social History Narrative  . Not on file    Family History:   Family History  Problem Relation Age of Onset  . Cancer Mother   . Diabetes Mother   . Parkinson's disease Father   . Heart failure Father      ROS:  Please see the history of present illness.  All other ROS reviewed and negative.     Physical Exam/Data:   Vitals:   07/06/19 1125 07/06/19 1130 07/06/19 1135 07/06/19 1140  BP: (!) 104/51 (!) 95/51 105/62 (!) 103/49  Pulse: (!) 57 60 (!) 58 (!) 59  Resp: 10 10 (!) 7 (!) 9  Temp:      TempSrc:  SpO2: 96% 94% 96% 96%  Weight:      Height:        Intake/Output Summary (Last 24 hours) at 07/06/2019 1232 Last data filed at 07/06/2019 0956 Gross per 24 hour  Intake 800 ml  Output 10 ml  Net 790 ml   Last 3 Weights  07/06/2019 07/02/2019 06/18/2019  Weight (lbs) 372 lb 5 oz 372 lb 5 oz 376 lb  Weight (kg) 168.88 kg 168.88 kg 170.552 kg     Body mass index is 51.93 kg/m.  General:  Well nourished,morbidly obese, in no acute distress HEENT: normal Lymph: no adenopathy Neck: no JVD Endocrine:  No thryomegaly Vascular: No carotid bruits  Cardiac:  RRR; (paced), soft SM, no gallops or rubs Lungs: CTA b/l (ant/lat only), no wheezing, rhonchi or rales  Abd: soft, nontender, obese  Ext: no edema Musculoskeletal:  No deformities Skin: warm and dry  Neuro:   no focal abnormalities noted Psych:  Normal affect   EKG:  The EKG was personally reviewed and demonstrates:    Today is asynchronous V pacing  06/07/2019 likely ST 1010 (suspect P wave buried in T), RBBB 06/04/2019 SR, 1st degree Avblock, PR , RBBB  Telemetry:  Telemetry was personally reviewed and demonstrates:   asynchronous V pacing at 60    Relevant CV Studies:  06/05/2019: TTE IMPRESSIONS 1. Left ventricular ejection fraction, by visual estimation, is 30 to 35%. The left ventricle has moderate to severely decreased function. There is no left ventricular hypertrophy.  2. Definity contrast agent was given IV to delineate the left ventricular endocardial borders.  3. Left ventricular diastolic parameters are indeterminate.  4. Moderately dilated left ventricular internal cavity size.  5. Diffuse hypokinesis worse in the apex and septum.  6. Global right ventricle has normal systolic function.The right ventricular size is normal. No increase in right ventricular wall thickness.  7. Left atrial size was moderately dilated.  8. Right atrial size was not well visualized.  9. Moderate mitral annular calcification. 10. Severe calcification of the mitral valve leaflet(s). 11. Severe thickening of the mitral valve leaflet(s). 12. The mitral valve is degenerative. Trace mitral valve regurgitation. 13. The tricuspid valve is normal in  structure. Tricuspid valve regurgitation is mild. 14. The aortic valve is tricuspid. Aortic valve regurgitation is not visualized. Severe aortic valve stenosis. 15. Severely calcified with restricted leaflet motion Likely low flow severe AS DVI 0.20 mean gradient increased from 28 mmHg to 35 mmHg peak increased from 47 to 51 mmHg since January. 16. The pulmonic valve was grossly normal. Pulmonic valve regurgitation is mild. 17. The interatrial septum was not well visualized.   08/14/2018: LHC 1. Mild to moderate, non-obstructive CAD. 2. Mildly to moderately elevated left heart filling pressures. 3. Moderately elevated right heart and pulmonary artery pressures.  Prominent RA tracing V-waves raise possibility of significant tricuspid regurgitation. 4. Low normal Fick cardiac output/index. 5. Moderate aortic stenosis.  Recommendations: 1. Continue diuresis. 2. Further workup and management of aortic stenosis per structural heart team. 3. Medical therapy to prevent progression of coronary artery disease.   Laboratory Data:  High Sensitivity Troponin:  No results for input(s): TROPONINIHS in the last 720 hours.   Chemistry Recent Labs  Lab 07/02/19 0946  07/06/19 1011 07/06/19 1048 07/06/19 1105  NA 138   < > 141 140 142  K 4.1   < > 4.1 4.3 3.4*  CL 101   < > 107 107 98  CO2 23  --   --   --   --  GLUCOSE 211*   < > 128* 135* 198*  BUN 11   < > 24* 23 11  CREATININE 0.96   < > 0.90 0.90 0.70  CALCIUM 8.7*  --   --   --   --   GFRNONAA >60  --   --   --   --   GFRAA >60  --   --   --   --   ANIONGAP 14  --   --   --   --    < > = values in this interval not displayed.    Recent Labs  Lab 07/02/19 0946  PROT 7.1  ALBUMIN 3.2*  AST 24  ALT 22  ALKPHOS 99  BILITOT 1.1   Hematology Recent Labs  Lab 07/02/19 0946  07/06/19 1011 07/06/19 1048 07/06/19 1105  WBC 9.1  --   --   --   --   RBC 5.05  --   --   --   --   HGB 14.5   < > 13.6 12.6* 12.9*  HCT 45.4   < >  40.0 37.0* 38.0*  MCV 89.9  --   --   --   --   MCH 28.7  --   --   --   --   MCHC 31.9  --   --   --   --   RDW 15.0  --   --   --   --   PLT 276  --   --   --   --    < > = values in this interval not displayed.   BNP Recent Labs  Lab 07/02/19 0946  BNP 249.8*    DDimer No results for input(s): DDIMER in the last 168 hours.   Radiology/Studies:  No results found.  Assessment and Plan:   1. VHD w/severe AS,      Today had Transcatheter Aortic Valve Replacement - Transfemoral Approach             Edwards Sapien 3 THV (size 29 mm, model # K966601, serial # [id number])      Developed CHB intra-procedure      Pacing at 60 via temp wire  2. Baseline conduction system disease with IVCD/RBBB and long 1st degree     Keep off his Toprol and avoid nodal blocking agents     I suspect he will need PPM given baseline conduction disease     Will reassess in the AM, NPO after MN tonight      Dr. Johney Frame will see later today I have put him tentatively on Dr. Lubertha Basque scheduled tomorrow for PPM, EF 30-35%, anticipate a PPM not ICD) and would rempve his loop       Continue ongoing management with structural team   For questions or updates, please contact [facility] Please consult [email or web address] for contact info under     Signed, Sheilah Pigeon, PA-C  07/06/2019 12:32 PM   I have seen, examined the patient, and reviewed the above assessment and plan.  Changes to above are made where necessary.  On exam, RRR.  The patient has chronic advanced conduction system disease and syncope.  He is now s/p TAVR.  EF is 35%.  I would therefore advise implantation of a biventricular pacemaker.  Dr Ladona Ridgel knows the patient well and will therefore follow-up for further decision with the patient in the am.  Co Sign: Hillis Range, MD 07/06/2019 5:21 PM

## 2019-07-06 NOTE — Transfer of Care (Signed)
Immediate Anesthesia Transfer of Care Note  Patient: Daniel Valenzuela  Procedure(s) Performed: TRANSCATHETER AORTIC VALVE REPLACEMENT, TRANSFEMORAL (N/A Chest) TRANSESOPHAGEAL ECHOCARDIOGRAM (TEE) (N/A )  Patient Location: Cath Lab  Anesthesia Type:MAC  Level of Consciousness: drowsy  Airway & Oxygen Therapy: Patient Spontanous Breathing and Patient connected to face mask oxygen  Post-op Assessment: Report given to RN and Post -op Vital signs reviewed and stable  Post vital signs: Reviewed and stable. Pt with temporary pacemaker in place.  Last Vitals:  Vitals Value Taken Time  BP 119/53 07/06/19 1020  Temp    Pulse 58 07/06/19 1024  Resp 19 07/06/19 1024  SpO2 97 % 07/06/19 1024  Vitals shown include unvalidated device data.  Last Pain:  Vitals:   07/06/19 0611  TempSrc: Oral  PainSc: 0-No pain         Complications: No apparent anesthesia complications

## 2019-07-06 NOTE — Progress Notes (Signed)
  [place] VALVE TEAM  Patient doing well s/p TAVR. He is hemodynamically stable. Groin sites stable. Temp wire in place. Patient is pacing at 60 bpm with underlying complete heart block. EP has seen and plan for tentative PPM tomorrow.   Angelena Form PA-C  [facility]  Pager [phone]

## 2019-07-07 ENCOUNTER — Encounter (HOSPITAL_COMMUNITY): Payer: Self-pay | Admitting: Cardiovascular Disease

## 2019-07-07 ENCOUNTER — Inpatient Hospital Stay (HOSPITAL_COMMUNITY): Payer: Medicare Other

## 2019-07-07 ENCOUNTER — Encounter (HOSPITAL_COMMUNITY)
Admission: RE | Disposition: A | Discharge status: Home / Self Care | Payer: Self-pay | Source: Home / Self Care | Attending: Cardiovascular Disease

## 2019-07-07 DIAGNOSIS — Z952 Presence of prosthetic heart valve: Secondary | ICD-10-CM | POA: Diagnosis not present

## 2019-07-07 DIAGNOSIS — I350 Nonrheumatic aortic (valve) stenosis: Principal | ICD-10-CM

## 2019-07-07 DIAGNOSIS — I5022 Chronic systolic (congestive) heart failure: Secondary | ICD-10-CM

## 2019-07-07 DIAGNOSIS — I442 Atrioventricular block, complete: Secondary | ICD-10-CM

## 2019-07-07 HISTORY — PX: PACEMAKER IMPLANT: EP1218

## 2019-07-07 HISTORY — PX: LOOP RECORDER REMOVAL: EP1215

## 2019-07-07 LAB — CBC
HCT: 40.6 % (ref 39.0–52.0)
Hemoglobin: 12.8 g/dL — ABNORMAL LOW (ref 13.0–17.0)
MCH: 28.3 pg (ref 26.0–34.0)
MCHC: 31.5 g/dL (ref 30.0–36.0)
MCV: 89.6 fL (ref 80.0–100.0)
Platelets: 176 10*3/uL (ref 150–400)
RBC: 4.53 MIL/uL (ref 4.22–5.81)
RDW: 14.9 % (ref 11.5–15.5)
WBC: 9.2 10*3/uL (ref 4.0–10.5)
nRBC: 0 % (ref 0.0–0.2)

## 2019-07-07 LAB — POCT I-STAT, CHEM 8
BUN: 14 mg/dL (ref 8–23)
Calcium, Ion: 1.17 mmol/L (ref 1.15–1.40)
Chloride: 108 mmol/L (ref 98–111)
Creatinine, Ser: 0.5 mg/dL — ABNORMAL LOW (ref 0.61–1.24)
Glucose, Bld: 92 mg/dL (ref 70–99)
HCT: 33 % — ABNORMAL LOW (ref 39.0–52.0)
Hemoglobin: 11.2 g/dL — ABNORMAL LOW (ref 13.0–17.0)
Potassium: 4 mmol/L (ref 3.5–5.1)
Sodium: 140 mmol/L (ref 135–145)
TCO2: 23 mmol/L (ref 22–32)

## 2019-07-07 LAB — BASIC METABOLIC PANEL
Anion gap: 12 (ref 5–15)
BUN: 9 mg/dL (ref 8–23)
CO2: 27 mmol/L (ref 22–32)
Calcium: 8.7 mg/dL — ABNORMAL LOW (ref 8.9–10.3)
Chloride: 101 mmol/L (ref 98–111)
Creatinine, Ser: 0.82 mg/dL (ref 0.61–1.24)
GFR calc Af Amer: >60 mL/min (ref >60)
GFR calc non Af Amer: >60 mL/min (ref >60)
Glucose, Bld: 161 mg/dL — ABNORMAL HIGH (ref 70–99)
Potassium: 3.8 mmol/L (ref 3.5–5.1)
Sodium: 140 mmol/L (ref 135–145)

## 2019-07-07 LAB — ECHOCARDIOGRAM COMPLETE
Height: 71 in
Weight: 5929.49 oz

## 2019-07-07 LAB — GLUCOSE, CAPILLARY
Glucose-Capillary: 120 mg/dL — ABNORMAL HIGH (ref 70–99)
Glucose-Capillary: 133 mg/dL — ABNORMAL HIGH (ref 70–99)
Glucose-Capillary: 154 mg/dL — ABNORMAL HIGH (ref 70–99)
Glucose-Capillary: 167 mg/dL — ABNORMAL HIGH (ref 70–99)
Glucose-Capillary: 168 mg/dL — ABNORMAL HIGH (ref 70–99)
Glucose-Capillary: 190 mg/dL — ABNORMAL HIGH (ref 70–99)

## 2019-07-07 LAB — MAGNESIUM: Magnesium: 1.5 mg/dL — ABNORMAL LOW (ref 1.7–2.4)

## 2019-07-07 SURGERY — PACEMAKER IMPLANT

## 2019-07-07 MED ORDER — HEPARIN (PORCINE) IN NACL 1000-0.9 UT/500ML-% IV SOLN
INTRAVENOUS | Status: AC
  Filled 2019-07-07: qty 500

## 2019-07-07 MED ORDER — VANCOMYCIN HCL 10 G IV SOLR
1500.0000 mg | Freq: Two times a day (BID) | INTRAVENOUS | Status: DC
  Filled 2019-07-07: qty 1500

## 2019-07-07 MED ORDER — VANCOMYCIN HCL 10 G IV SOLR
1500.0000 mg | Freq: Once | INTRAVENOUS | Status: AC
  Administered 2019-07-07: 1500 mg via INTRAVENOUS
  Filled 2019-07-07: qty 1500

## 2019-07-07 MED ORDER — HEPARIN (PORCINE) IN NACL 1000-0.9 UT/500ML-% IV SOLN
INTRAVENOUS | Status: DC | PRN
  Administered 2019-07-07: 500 mL

## 2019-07-07 MED ORDER — SODIUM CHLORIDE 0.9 % IV SOLN
INTRAVENOUS | Status: AC
  Filled 2019-07-07: qty 2

## 2019-07-07 MED ORDER — ONDANSETRON HCL 4 MG/2ML IJ SOLN: 4.0000 mg | Freq: Four times a day (QID) | INTRAMUSCULAR | Status: DC | PRN

## 2019-07-07 MED ORDER — LIDOCAINE HCL (PF) 1 % IJ SOLN
INTRAMUSCULAR | Status: DC | PRN
  Administered 2019-07-07: 60 mL

## 2019-07-07 MED ORDER — FENTANYL CITRATE (PF) 100 MCG/2ML IJ SOLN
INTRAMUSCULAR | Status: DC | PRN
  Administered 2019-07-07: 12.5 ug via INTRAVENOUS
  Administered 2019-07-07: 25 ug via INTRAVENOUS
  Administered 2019-07-07 (×2): 12.5 ug via INTRAVENOUS

## 2019-07-07 MED ORDER — IOHEXOL 350 MG/ML SOLN
INTRAVENOUS | Status: DC | PRN
  Administered 2019-07-07: 40 mL

## 2019-07-07 MED ORDER — ACETAMINOPHEN 325 MG PO TABS: 325.0000 mg | ORAL_TABLET | ORAL | Status: DC | PRN

## 2019-07-07 MED ORDER — LIDOCAINE HCL (PF) 1 % IJ SOLN
INTRAMUSCULAR | Status: AC
  Filled 2019-07-07: qty 60

## 2019-07-07 MED ORDER — MAGNESIUM SULFATE 4 GM/100ML IV SOLN
4.0000 g | Freq: Once | INTRAVENOUS | Status: AC
  Administered 2019-07-07: 4 g via INTRAVENOUS
  Filled 2019-07-07: qty 100

## 2019-07-07 MED ORDER — MIDAZOLAM HCL 5 MG/5ML IJ SOLN
INTRAMUSCULAR | Status: AC
  Filled 2019-07-07: qty 5

## 2019-07-07 MED ORDER — FENTANYL CITRATE (PF) 100 MCG/2ML IJ SOLN
INTRAMUSCULAR | Status: AC
  Filled 2019-07-07: qty 2

## 2019-07-07 MED ORDER — POTASSIUM CHLORIDE CRYS ER 20 MEQ PO TBCR
20.0000 meq | EXTENDED_RELEASE_TABLET | Freq: Every day | ORAL | Status: DC
  Administered 2019-07-07 – 2019-07-08 (×2): 20 meq via ORAL
  Filled 2019-07-07 (×2): qty 1

## 2019-07-07 MED ORDER — MIDAZOLAM HCL 5 MG/5ML IJ SOLN
INTRAMUSCULAR | Status: DC | PRN
  Administered 2019-07-07 (×3): 1 mg via INTRAVENOUS
  Administered 2019-07-07: 2 mg via INTRAVENOUS

## 2019-07-07 MED ORDER — LIDOCAINE HCL 1 % IJ SOLN
INTRAMUSCULAR | Status: AC
  Filled 2019-07-07: qty 60

## 2019-07-07 MED ORDER — PERFLUTREN LIPID MICROSPHERE
1.0000 mL | INTRAVENOUS | Status: AC | PRN
  Administered 2019-07-07: 4 mL via INTRAVENOUS
  Filled 2019-07-07: qty 10

## 2019-07-07 MED ORDER — FUROSEMIDE 40 MG PO TABS
60.0000 mg | ORAL_TABLET | Freq: Every day | ORAL | Status: DC
  Administered 2019-07-07 – 2019-07-08 (×2): 60 mg via ORAL
  Filled 2019-07-07 (×2): qty 1

## 2019-07-07 SURGICAL SUPPLY — 17 items
CABLE SURGICAL S-101-97-12 (CABLE) ×3 IMPLANT
CATH CPS DIRECT 135 DS2C020 (CATHETERS) ×3 IMPLANT
CATH HEX JOS 2-5-2 65CM 6F REP (CATHETERS) ×3 IMPLANT
CPS IMPLANT KIT 410190 (MISCELLANEOUS) ×3 IMPLANT
HOVERMATT SINGLE USE (MISCELLANEOUS) ×3 IMPLANT
KIT ESSENTIALS PG (KITS) ×3 IMPLANT
LEAD QUARTET 1456Q-86 (Lead) ×1 IMPLANT
LEAD TENDRIL MRI 52CM LPA1200M (Lead) ×3 IMPLANT
LEAD TENDRIL MRI 58CM LPA1200M (Lead) ×3 IMPLANT
PACEMAKER QUDR ALLR CRT PM3562 (Pacemaker) ×1 IMPLANT
PAD PRO RADIOLUCENT 2001M-C (PAD) ×3 IMPLANT
PMKR QUADRA ALLURE CRT PM3562 (Pacemaker) ×3 IMPLANT
QUARTET 1456Q-86 (Lead) ×3 IMPLANT
SHEATH 8FR PRELUDE SNAP 13 (SHEATH) ×6 IMPLANT
SLITTER UNIVERSAL DS2A003 (MISCELLANEOUS) ×3 IMPLANT
TRAY PACEMAKER INSERTION (PACKS) ×3 IMPLANT
WIRE ACUITY WHISPER EDS 4648 (WIRE) ×3 IMPLANT

## 2019-07-07 NOTE — Progress Notes (Addendum)
Progress Note  Patient Name: Daniel Valenzuela Date of Encounter: 07/07/2019  Primary Cardiologist: Cristopher Peru, MD   Subjective   Feels well, no CP or SOB  Inpatient Medications    Scheduled Meds: . aspirin  81 mg Oral Daily  . atorvastatin  40 mg Oral Daily  . buPROPion  300 mg Oral Daily  . chlorhexidine  60 mL Topical Once  . Chlorhexidine Gluconate Cloth  6 each Topical Q0600  . clopidogrel  75 mg Oral Q breakfast  . escitalopram  20 mg Oral Daily  . furosemide  60 mg Oral Daily  . gabapentin  600 mg Oral Daily   And  . gabapentin  1,200 mg Oral QHS  . gentamicin irrigation  80 mg Irrigation On Call  . insulin aspart  0-24 Units Subcutaneous TID AC & HS  . mupirocin ointment  1 application Nasal BID  . pantoprazole  40 mg Oral Daily  . potassium chloride SA  20 mEq Oral Daily  . silver sulfADIAZINE   Topical BID  . sodium chloride flush  3 mL Intravenous Q12H  . sodium chloride flush  3 mL Intravenous Q12H  . tamsulosin  0.4 mg Oral QPC supper   Continuous Infusions: . sodium chloride    . sodium chloride    . sodium chloride    . [COMPLETED] levofloxacin (LEVAQUIN) IV 750 mg (07/07/19 1110)  . magnesium sulfate bolus IVPB    . nitroGLYCERIN Stopped (07/06/19 1735)  . phenylephrine (NEO-SYNEPHRINE) Adult infusion Stopped (07/06/19 1735)  . vancomycin 1,500 mg (07/07/19 1108)   PRN Meds: sodium chloride, acetaminophen **OR** acetaminophen, morphine injection, ondansetron (ZOFRAN) IV, sodium chloride flush, sodium chloride flush, traMADol   Vital Signs    Vitals:   07/07/19 0400 07/07/19 0500 07/07/19 0600 07/07/19 0700  BP: (!) 97/48 (!) 124/49 (!) 105/91 (!) 80/30  Pulse:      Resp: (!) 9 11 18 12   Temp: 98.3 F (36.8 C)   98.2 F (36.8 C)  TempSrc: Oral   Oral  SpO2: 99% 98% 99% 100%  Weight:   (!) 168.1 kg   Height:        Intake/Output Summary (Last 24 hours) at 07/07/2019 1138 Last data filed at 07/07/2019 0430 Gross per 24 hour  Intake  559.53 ml  Output 2300 ml  Net -1740.47 ml   Last 3 Weights 07/07/2019 07/06/2019 07/02/2019  Weight (lbs) 370 lb 9.5 oz 372 lb 5 oz 372 lb 5 oz  Weight (kg) 168.1 kg 168.88 kg 168.88 kg      Telemetry    CHB w/V pacing - Personally Reviewed  ECG    Asynchronous V pacing - Personally Reviewed  Physical Exam  * GEN: No acute distress.   Neck: No JVD Cardiac: RRR, no murmurs, rubs, or gallops.  Respiratory: CTA b/l. GI: Soft, nontender, non-distended, obese MS: No edema; No deformity. Neuro:  Nonfocal  Psych: Normal affect   Temp pacing wire left groin  Labs    High Sensitivity Troponin:  No results for input(s): TROPONINIHS in the last 720 hours.    Chemistry Recent Labs  Lab 07/02/19 0946  07/06/19 1105 07/06/19 1247 07/07/19 0224  NA 138   < > 142 140 140  K 4.1   < > 3.4* 4.0 3.8  CL 101   < > 98 108 101  CO2 23  --   --   --  27  GLUCOSE 211*   < > 198* 92 161*  BUN 11   < > 11 14 9   CREATININE 0.96   < > 0.70 0.50* 0.82  CALCIUM 8.7*  --   --   --  8.7*  PROT 7.1  --   --   --   --   ALBUMIN 3.2*  --   --   --   --   AST 24  --   --   --   --   ALT 22  --   --   --   --   ALKPHOS 99  --   --   --   --   BILITOT 1.1  --   --   --   --   GFRNONAA >60  --   --   --  >60  GFRAA >60  --   --   --  >60  ANIONGAP 14  --   --   --  12   < > = values in this interval not displayed.     Hematology Recent Labs  Lab 07/02/19 0946  07/06/19 1105 07/06/19 1247 07/07/19 0224  WBC 9.1  --   --   --  9.2  RBC 5.05  --   --   --  4.53  HGB 14.5   < > 12.9* 11.2* 12.8*  HCT 45.4   < > 38.0* 33.0* 40.6  MCV 89.9  --   --   --  89.6  MCH 28.7  --   --   --  28.3  MCHC 31.9  --   --   --  31.5  RDW 15.0  --   --   --  14.9  PLT 276  --   --   --  176   < > = values in this interval not displayed.    BNP Recent Labs  Lab 07/02/19 0946  BNP 249.8*     DDimer No results for input(s): DDIMER in the last 168 hours.   Radiology    Dg Chest Port 1 View  Result Date: 07/06/2019 CLINICAL DATA:  Follow-up TAVR EXAM: PORTABLE CHEST 1 VIEW COMPARISON:  07/02/2019 FINDINGS: Mild cardiomegaly. TAVR inapparent due to under penetration of the mediastinum. Mild pulmonary venous hypertension without frank edema. No infiltrate, collapse or effusion. Loop recorder in place. IMPRESSION: Mild cardiomegaly. Mild pulmonary venous hypertension. No frank edema. No consolidation or collapse. Electronically Signed   By: 14/10/2018 M.D.   On: 07/06/2019 12:43    Cardiac Studies    07/07/2019 TTE IMPRESSIONS  1. 29 mm S3 TAVR. Vmax 2.8 m/s. Mean gradient 17 mmHG. EOA 1.29 cm2. Normal functioning prosthetic valve without paravalvular leak detected.  2. Left ventricular ejection fraction, by visual estimation, is 35 to 40%. The left ventricle has moderately decreased function. There is no left ventricular hypertrophy.  3. Severe hypokinesis of the apex. Global hypokinesis in other segments.  4. Definity contrast agent was given IV to delineate the left ventricular endocardial borders.  5. Abnormal septal motion consistent with RV pacemaker.  6. Left ventricular diastolic function could not be evaluated.  7. Global right ventricle has low normal systolic function.The right ventricular size is normal. No increase in right ventricular wall thickness.  8. Left atrial size was mildly dilated.  9. Right atrial size was normal. 10. Presence of pericardial fat pad. 11. Mild to moderate mitral annular calcification. 12. The mitral valve is degenerative. No evidence of mitral valve regurgitation. No evidence of mitral stenosis. 13. Aortic  valve regurgitation is not visualized. 14. The pulmonic valve was not well visualized. Pulmonic valve regurgitation is not visualized. 15. The inferior vena cava is dilated in size with >50% respiratory variability, suggesting right atrial pressure of 8 mmHg. 16. The tricuspid valve is not well visualized. Tricuspid valve regurgitation is  not demonstrated. 17. TR signal is inadequate for assessing pulmonary artery systolic pressure. 18. Complete heart block present with RV apical pacing.  FINDINGS  Left Ventricle: Left ventricular ejection fraction, by visual estimation, is 35 to 40%. The left ventricle has moderately decreased function. Definity contrast agent was given IV to delineate the left ventricular endocardial borders. The left ventricle  is not well visualized. There is no left ventricular hypertrophy. Abnormal (paradoxical) septal motion, consistent with RV pacemaker. The left ventricular diastology could not be evaluated due to nondiagnostic images. Left ventricular diastolic function  could not be evaluated. Severe hypokinesis of the apex. Global hypokinesis in other segments.  Right Ventricle: The right ventricular size is normal. No increase in right ventricular wall thickness. Global RV systolic function is has low normal systolic function.  Left Atrium: Left atrial size was mildly dilated.  Right Atrium: Right atrial size was normal in size  Pericardium: There is no evidence of pericardial effusion. Presence of pericardial fat pad.  Mitral Valve: The mitral valve is degenerative in appearance. Mild to moderate mitral annular calcification. No evidence of mitral valve stenosis by observation. No evidence of mitral valve regurgitation.  Tricuspid Valve: The tricuspid valve is not well visualized. Tricuspid valve regurgitation is not demonstrated.  Aortic Valve: The aortic valve has been repaired/replaced. Aortic valve regurgitation is not visualized. Aortic valve mean gradient measures 17.7 mmHg. Aortic valve peak gradient measures 30.3 mmHg. Aortic valve area, by VTI measures 1.29 cm. 77mm  Edwards Sapien bioprosthetic, stented aortic valve (TAVR) valve is present in the aortic position. Procedure Date: 07/06/2019. 29 mm S3 TAVR. Vmax 2.8 m/s. Mean gradient 17 mmHG. EOA 1.29 cm2. Normal functioning  prosthetic valve without paravalvular leak  detected.  Pulmonic Valve: The pulmonic valve was not well visualized. Pulmonic valve regurgitation is not visualized.  Aorta: The aortic root is normal in size and structure.  Venous: The inferior vena cava is dilated in size with greater than 50% respiratory variability, suggesting right atrial pressure of 8 mmHg.  IAS/Shunts: No atrial level shunt detected by color flow Doppler.    LEFT VENTRICLE PLAX 2D LVOT diam:     2.10 cm LVOT Area:     3.46 cm   LV Volumes (MOD) LV area d, A4C:    41.30 cm LV area s, A4C:    32.30 cm LV major d, A4C:   9.11 cm LV major s, A4C:   8.62 cm LV vol d, MOD A4C: 148.0 ml LV vol s, MOD A4C: 99.4 ml LV SV MOD A4C:     148.0 ml  RIGHT VENTRICLE TAPSE (M-mode): 1.8 cm  LEFT ATRIUM             Index       RIGHT ATRIUM           Index LA Vol (A2C):   61.8 ml 22.55 ml/m RA Area:     19.40 cm LA Vol (A4C):   71.3 ml 26.02 ml/m RA Volume:   46.80 ml  17.08 ml/m LA Biplane Vol: 68.0 ml 24.81 ml/m  AORTIC VALVE AV Area (Vmax):    1.27 cm AV Area (Vmean):   1.33 cm AV Area (VTI):  1.29 cm AV Vmax:           275.33 cm/s AV Vmean:          194.667 cm/s AV VTI:            0.543 m AV Peak Grad:      30.3 mmHg AV Mean Grad:      17.7 mmHg LVOT Vmax:         101.00 cm/s LVOT Vmean:        74.700 cm/s LVOT VTI:          0.202 m LVOT/AV VTI ratio: 0.37    SHUNTS Systemic VTI:  0.20 m Systemic Diam: 2.10 cm    Lennie Odor MD Electronically signed by Lennie Odor MD Signature Date/Time: 07/07/2019/9:36:08 AM      Patient Profile     68 y.o. male with a hx of Morbid obesity (s/p gastric banding 2009, BMI currently 51.93), HTN, IDDM (II), OSA (not onCPAP), GERD, chronic CHF (combined), syncope (suspect to be vagal at the time, has loop without arrhythmias or brady), RBBB, and more recently VHD w/AS  Admitted yesterday for his TAVR, inter-op developed CHB  Assessment &  Plan    1. VHD w/severe AS,      POD #1     Transcatheter Aortic Valve Replacement - Transfemoral Approach Edwards Sapien 3 THV (size 63mm, model # [id number], serial [id number])      Developed CHB intra-procedure      Pacing at 60 via temp wire continues  2. Baseline conduction system disease with IVCD/RBBB and long 1st degree     last dose of home Toprol was 07/05/2019     CHB post TAVR continues     LVEF by TTE this AM 35-40%      Planned for CRT-P implant and removal of his loop today  Discussed procedure with the patient, indication and rational, including risks and benefits Answered follow up questions today Dr. Ladona Ridgel has seen and examined the patient today The patient remains agreeable to proceed   Continue care otherwise with primary team   For questions or updates, please contact [facility] Please consult [email or web address] for contact info under   Signed, Sheilah Pigeon, PA-C  07/07/2019, 11:38 AM    EP Attending  Patient seen and examined. Agree with above. The patient has persistent CHB. He has a temporary pm in his leg. He will undergo insertion of a biv PPM. I have discussed the indications/risks/benefits/goals/expectations of the procedure and he wishes to proceed.  Leonia Reeves.D.

## 2019-07-07 NOTE — Progress Notes (Signed)
  Echocardiogram 2D Echocardiogram has been performed.  Burnett Kanaris 07/07/2019, 9:11 AM

## 2019-07-07 NOTE — Progress Notes (Addendum)
[facility] TEAM  Patient Name: Daniel Valenzuela Date of Encounter: 07/07/2019  Primary Cardiologist: Dr. Lovena Le / Dr. Angelena Form & Dr. Cyndia Bent (TAVR)  Hospital Problem List     Principal Problem:   S/P TAVR (transcatheter aortic valve replacement) Active Problems:   Diabetic neuropathy (HCC)   High cholesterol   RBBB (right bundle branch block with left anterior fascicular block)   Sleep apnea   Type 2 diabetes mellitus with complication, with long-term current use of insulin (HCC)   Acute combined systolic and diastolic CHF   Morbid obesity with body mass index of 50 or higher (HCC)   CAD (coronary artery disease)   NICM (nonischemic cardiomyopathy) (HCC)   Severe aortic stenosis   First degree AV block   Renal lesion   Pulmonary nodules     Subjective   No complaints. Getting echo. Still on bed rest.   Inpatient Medications    Scheduled Meds:  aspirin  81 mg Oral Daily   atorvastatin  40 mg Oral Daily   buPROPion  300 mg Oral Daily   chlorhexidine  60 mL Topical Once   Chlorhexidine Gluconate Cloth  6 each Topical Q0600   clopidogrel  75 mg Oral Q breakfast   escitalopram  20 mg Oral Daily   gabapentin  600 mg Oral Daily   And   gabapentin  1,200 mg Oral QHS   gentamicin irrigation  80 mg Irrigation On Call   insulin aspart  0-24 Units Subcutaneous TID AC & HS   mupirocin ointment  1 application Nasal BID   pantoprazole  40 mg Oral Daily   silver sulfADIAZINE   Topical BID   sodium chloride flush  3 mL Intravenous Q12H   sodium chloride flush  3 mL Intravenous Q12H   tamsulosin  0.4 mg Oral QPC supper   Continuous Infusions:  sodium chloride     sodium chloride     sodium chloride     levofloxacin (LEVAQUIN) IV     nitroGLYCERIN Stopped (07/06/19 1735)   phenylephrine (NEO-SYNEPHRINE) Adult infusion Stopped (07/06/19 1735)   vancomycin     PRN Meds: sodium chloride, acetaminophen  **OR** acetaminophen, morphine injection, ondansetron (ZOFRAN) IV, perflutren lipid microspheres (DEFINITY) IV suspension, sodium chloride flush, sodium chloride flush, traMADol   Vital Signs    Vitals:   07/07/19 0400 07/07/19 0500 07/07/19 0600 07/07/19 0700  BP: (!) 97/48 (!) 124/49 (!) 105/91 (!) 80/30  Pulse:      Resp: (!) 9 11 18 12   Temp: 98.3 F (36.8 C)   98.2 F (36.8 C)  TempSrc: Oral   Oral  SpO2: 99% 98% 99% 100%  Weight:   (!) 168.1 kg   Height:        Intake/Output Summary (Last 24 hours) at 07/07/2019 0900 Last data filed at 07/07/2019 0430 Gross per 24 hour  Intake 1409.53 ml  Output 2310 ml  Net -900.47 ml   Filed Weights   07/06/19 0611 07/07/19 0600  Weight: (!) 168.9 kg (!) 168.1 kg    Physical Exam   GEN: Well nourished, well developed, in no acute distress. Morbidly obese HEENT: Grossly normal.  Neck: Supple, no JVD, carotid bruits, or masses. Cardiac: RRR, no murmurs, rubs, or gallops. No clubbing, cyanosis, edema.   Respiratory:  Respirations regular and unlabored, clear to auscultation bilaterally. GI: Soft, nontender, nondistended, BS + x 4. MS: no deformity or atrophy. Skin: warm and dry, no rash.  Groin sites clear without hematoma or ecchymosis. Temp wire in place. Neuro:  Strength and sensation are intact. Psych: AAOx3.  Normal affect.  Labs    CBC Recent Labs    07/06/19 1105 07/07/19 0224  WBC  --  9.2  HGB 12.9* 12.8*  HCT 38.0* 40.6  MCV  --  89.6  PLT  --  176   Basic Metabolic Panel Recent Labs    22/29/79 1105 07/07/19 0224  NA 142 140  K 3.4* 3.8  CL 98 101  CO2  --  27  GLUCOSE 198* 161*  BUN 11 9  CREATININE 0.70 0.82  CALCIUM  --  8.7*  MG  --  1.5*   Liver Function Tests No results for input(s): AST, ALT, ALKPHOS, BILITOT, PROT, ALBUMIN in the last 72 hours. No results for input(s): LIPASE, AMYLASE in the last 72 hours. Cardiac Enzymes No results for input(s): CKTOTAL, CKMB, CKMBINDEX, TROPONINI in  the last 72 hours. BNP Invalid input(s): POCBNP D-Dimer No results for input(s): DDIMER in the last 72 hours. Hemoglobin A1C No results for input(s): HGBA1C in the last 72 hours. Fasting Lipid Panel No results for input(s): CHOL, HDL, LDLCALC, TRIG, CHOLHDL, LDLDIRECT in the last 72 hours. Thyroid Function Tests No results for input(s): TSH, T4TOTAL, T3FREE, THYROIDAB in the last 72 hours.  Invalid input(s): FREET3  Telemetry    pacing - Personally Reviewed  ECG    Sinus rhythm with complete heart block and Ventricular-paced rhythm HR 60bpm- Personally Reviewed  Radiology    Dg Chest Port 1 View  Result Date: 07/06/2019 CLINICAL DATA:  Follow-up TAVR EXAM: PORTABLE CHEST 1 VIEW COMPARISON:  07/02/2019 FINDINGS: Mild cardiomegaly. TAVR inapparent due to under penetration of the mediastinum. Mild pulmonary venous hypertension without frank edema. No infiltrate, collapse or effusion. Loop recorder in place. IMPRESSION: Mild cardiomegaly. Mild pulmonary venous hypertension. No frank edema. No consolidation or collapse. Electronically Signed   By: Paulina Fusi M.D.   On: 07/06/2019 12:43    Cardiac Studies   TAVR OPERATIVE NOTE   Date of Procedure:                07/06/2019  Preoperative Diagnosis:      Severe Aortic Stenosis   Postoperative Diagnosis:    Same   Procedure:        Transcatheter Aortic Valve Replacement - Percutaneous Right Transfemoral Approach             Edwards Sapien 3 THV (size 29 mm, model # [id number], serial # [id number])              Co-Surgeons:                        Alleen Borne, MD and Verne Carrow, MD   Anesthesiologist:                  Arta Bruce, MD.  Echocardiographer:              Charlton Haws, MD.  Pre-operative Echo Findings: ? Severe aortic stenosis ? Moderate left ventricular systolic function  Post-operative Echo Findings: ? No paravalvular leak ? Moderate left ventricular systolic  function  ---------------------  Echo 07/07/19: pending  Patient Profile     Daniel Valenzuela is a 68 y.o. male with a history of  HTN, morbid obesity s/p gastric banding in 2009 (BMI 50), IDDMT2, OSA not on CPAP, conduction disease (RBBB, 1st deg AV block), syncope  s/p loop recorder, GERD, chronic combined S/D CHF with recent admission for CHF and severe aortic stenosis who presented to [facility] on 07/06/19 for planned TAVR.   Assessment & Plan    Severe AS: s/p successful TAVR with a 29 mm Edwards Sapien 3 THV via the TF approach on 07/06/19. Post operative echo pending. Groin sites are stable. ECG with underlying CHB and paced rhythm. Started on Asprin and Plavix.   CHB: patient was high risk with a pre existing RBBB and long first degree AV block with loop recorder in place after previous syncope. After TAVR he developed CHB which has been persistent. EP has seen and plans for pacer placement today.   Acute on chronic combined S/D CHF: as evidenced by an elevated BNP on pre admission lab work and mild cardiomegaly on CXR. This has been treated with TAVR. Resume home lasix dosing today. EF has been ~30-35% since Jan 2020. He has not been treated with a BB given underlying conduction disease and ACE was discontinued during most recent admission due to soft BPs. Echo completed this AM, if EF remains severely depressed ? consider ICD.   IDDM: continue SSI while admitted.    Signed, Cline Crock, PA-C  07/07/2019, 9:00 AM  Pager [phone]  I have personally seen and examined this patient with Carlean Jews, PA-C. I agree with the assessment and plan as outlined above. He is doing well post TAVR. High suspicion that he would need a pacemaker post TAVR given underlying RBBB and long 1st degree AV block. He remains in CHB this am, requiring the temp pacing wire. Appreciate EP input. Will plan BiV pacemaker vs BIV ICD later today. Continue ASA and Plavix.   Verne Carrow 07/07/2019 9:29  AM

## 2019-07-08 ENCOUNTER — Inpatient Hospital Stay (HOSPITAL_COMMUNITY): Payer: Medicare Other

## 2019-07-08 ENCOUNTER — Other Ambulatory Visit: Payer: Self-pay | Admitting: Physician Assistant

## 2019-07-08 DIAGNOSIS — Z952 Presence of prosthetic heart valve: Secondary | ICD-10-CM

## 2019-07-08 DIAGNOSIS — I442 Atrioventricular block, complete: Secondary | ICD-10-CM | POA: Diagnosis not present

## 2019-07-08 DIAGNOSIS — I350 Nonrheumatic aortic (valve) stenosis: Secondary | ICD-10-CM | POA: Diagnosis not present

## 2019-07-08 LAB — CBC
HCT: 38.1 % — ABNORMAL LOW (ref 39.0–52.0)
Hemoglobin: 12 g/dL — ABNORMAL LOW (ref 13.0–17.0)
MCH: 28.5 pg (ref 26.0–34.0)
MCHC: 31.5 g/dL (ref 30.0–36.0)
MCV: 90.5 fL (ref 80.0–100.0)
Platelets: 148 10*3/uL — ABNORMAL LOW (ref 150–400)
RBC: 4.21 MIL/uL — ABNORMAL LOW (ref 4.22–5.81)
RDW: 15.1 % (ref 11.5–15.5)
WBC: 7.3 10*3/uL (ref 4.0–10.5)
nRBC: 0 % (ref 0.0–0.2)

## 2019-07-08 LAB — BASIC METABOLIC PANEL
Anion gap: 9 (ref 5–15)
BUN: 7 mg/dL — ABNORMAL LOW (ref 8–23)
CO2: 28 mmol/L (ref 22–32)
Calcium: 8.3 mg/dL — ABNORMAL LOW (ref 8.9–10.3)
Chloride: 102 mmol/L (ref 98–111)
Creatinine, Ser: 0.7 mg/dL (ref 0.61–1.24)
GFR calc Af Amer: >60 mL/min (ref >60)
GFR calc non Af Amer: >60 mL/min (ref >60)
Glucose, Bld: 193 mg/dL — ABNORMAL HIGH (ref 70–99)
Potassium: 3.6 mmol/L (ref 3.5–5.1)
Sodium: 139 mmol/L (ref 135–145)

## 2019-07-08 LAB — GLUCOSE, CAPILLARY
Glucose-Capillary: 204 mg/dL — ABNORMAL HIGH (ref 70–99)
Glucose-Capillary: 210 mg/dL — ABNORMAL HIGH (ref 70–99)

## 2019-07-08 LAB — MAGNESIUM: Magnesium: 1.9 mg/dL (ref 1.7–2.4)

## 2019-07-08 MED ORDER — ASPIRIN 81 MG PO CHEW: 81.0000 mg | CHEWABLE_TABLET | Freq: Every day | ORAL | Status: AC

## 2019-07-08 MED ORDER — CLOPIDOGREL BISULFATE 75 MG PO TABS: 75.0000 mg | ORAL_TABLET | Freq: Every day | ORAL | 1 refills | Status: AC

## 2019-07-08 NOTE — Discharge Summary (Addendum)
[facility]   MULTIDISCIPLINARY HEART VALVE TEAM  Discharge Summary    Patient ID: Daniel Valenzuela MRN: [id number]; DOB: 1951-01-29  Admit date: 07/06/2019 Discharge date: 07/08/2019  Primary Care Provider: Carylon Perches, MD  Primary Cardiologist: Daniel Bunting, MD / Dr. Clifton Valenzuela & Dr. Laneta Valenzuela (TAVR)  Discharge Diagnoses    Principal Problem:   S/P TAVR (transcatheter aortic valve replacement) Active Problems:   Diabetic neuropathy (HCC)   High cholesterol   RBBB (right bundle branch block with left anterior fascicular block)   Sleep apnea   Type 2 diabetes mellitus with complication, with long-term current use of insulin (HCC)   Acute combined systolic and diastolic CHF   Morbid obesity with body mass index of 50 or higher (HCC)   CAD (coronary artery disease)   NICM (nonischemic cardiomyopathy) (HCC)   Severe aortic stenosis   First degree AV block   Renal lesion   Pulmonary nodules   Allergies Allergies  Allergen Reactions   Methyldopa-Hydrochlorothiazide Other (See Comments)    GYNOCOMASTIA   Motrin [Ibuprofen] Other (See Comments)    GYNOCOMASTIA   Nsaids Other (See Comments)    Patient has had lap band surgery- cannot tolerate this class of meds   Amoxicillin Rash    Did it involve swelling of the face/tongue/throat, SOB, or low BP? No Did it involve sudden or severe rash/hives, skin peeling, or any reaction on the inside of your mouth or nose? Yes Did you need to seek medical attention at a hospital or doctor's office? No When did it last happen?More than 30 years ago If all above answers are NO, may proceed with cephalosporin use. .    Diagnostic Studies/Procedures    TAVR OPERATIVE NOTE   Date of Procedure:07/06/2019  Preoperative Diagnosis:Severe Aortic Stenosis   Postoperative Diagnosis:Same   Procedure:   Transcatheter Aortic Valve Replacement - PercutaneousRightTransfemoral  Approach Edwards Sapien 3 THV (size 29mm, model # 9600TFX, serial #[id number])  Co-Surgeons:Daniel Jennefer Bravo, MD and Daniel Carrow, MD   Anesthesiologist:Daniel Michelle Piper, MD.  Echocardiographer:Daniel Eden Emms, MD.  Pre-operative Echo Findings: ? Severe aortic stenosis ? Moderateleft ventricular systolic function  Post-operative Echo Findings: ? Noparavalvular leak ? Moderateleft ventricular systolic function  _______________  Echo 07/07/19:  IMPRESSIONS  1. 29 mm S3 TAVR. Vmax 2.8 m/s. Mean gradient 17 mmHG. EOA 1.29 cm2. Normal functioning prosthetic valve without paravalvular leak detected.  2. Left ventricular ejection fraction, by visual estimation, is 35 to 40%. The left ventricle has moderately decreased function. There is no left ventricular hypertrophy.  3. Severe hypokinesis of the apex. Global hypokinesis in other segments.  4. Definity contrast agent was given IV to delineate the left ventricular endocardial borders.  5. Abnormal septal motion consistent with RV pacemaker.  6. Left ventricular diastolic function could not be evaluated.  7. Global right ventricle has low normal systolic function.The right ventricular size is normal. No increase in right ventricular wall thickness.  8. Left atrial size was mildly dilated.  9. Right atrial size was normal. 10. Presence of pericardial fat pad. 11. Mild to moderate mitral annular calcification. 12. The mitral valve is degenerative. No evidence of mitral valve regurgitation. No evidence of mitral stenosis. 13. Aortic valve regurgitation is not visualized. 14. The pulmonic valve was not well visualized. Pulmonic valve regurgitation is not visualized. 15. The inferior vena cava is dilated in size with >50% respiratory variability, suggesting right atrial pressure of 8 mmHg. 16. The tricuspid valve is not well visualized. Tricuspid valve  regurgitation is not demonstrated. 17. TR signal is inadequate for assessing pulmonary artery systolic pressure. 18. Complete heart block present with RV apical pacing.  _____________  07/07/19 LOOP RECORDER REMOVAL  PACEMAKER IMPLANT   CONCLUSIONS:  1. Successful implantation of a St. Jude BiV pacemaker for symptomatic complete AV block after TAVR  2. No early apparent complications.       History of Present Illness     Daniel Valenzuela is a 68 y.o. male with a history of HTN, morbid obesitys/pgastric banding in 2009(BMI 50), IDDMT2, OSA not on CPAP, conduction disease (RBBB, 1st deg AV block), syncope s/p loop recorder, GERD,chronic combined S/D CHF with recent admission for CHF and severe aortic stenosiswho presented to [facility] 07/06/19 for planned TAVR.   Patient's cardiac history dates back to February 2019 when he was admitted following a syncopal episode.  Etiology was eventually attributed to vasovagal symptoms and have not recurred since verapamil was discontinued, but the patient underwent loop recorder implantation because of underlying right bundle branch block with long first-degree AV block.  Echocardiogram at that time revealed normal left ventricular systolic function with mild aortic stenosis.  He has been followed intermittently ever since by Daniel Valenzuela and has never had any evidence of syncope or significant advanced conduction system disease based upon loop recorder.  Patient was hospitalized in January 2020 with acute exacerbation of chronic congestive heart failure and massive volume overload.  Echocardiogram performed at that time revealed significant left ventricular systolic dysfunction with ejection fraction estimated only 35 to 40%.  There was felt to be moderate to severe aortic stenosis with mean transvalvular gradient estimated 28 mmHg and aortic valve area calculated 1.34 cm. The patient underwent diagnostic cardiac catheterization which revealed mild to  moderate nonobstructive coronary artery disease with moderate aortic stenosis based on mean transvalvular gradient measured 23 mmHg at catheterization and aortic valve area calculated 1.4 cm.  The patient was recently readmitted to the hospital in early November with another acute exacerbation of chronic congestive heart failure and pulmonary edema.  Follow-up echocardiogram revealed left ventricular ejection fraction down further to 30-35%.  Despite this there was evidence of severe aortic stenosis with mean transvalvular gradient estimated 35 mmHg, DVI reported 0.20 and aortic valve area calculated only 0.83 cm.  The patient has been evaluated by the multidisciplinary valve team and felt to have severe, symptomatic aortic stenosis and to be a suitable candidate for TAVR, which was set up for 07/06/19.    Hospital Course     Consultants: electrophysiology   Severe AS:s/p successful TAVR with a 29 mm Edwards Sapien 3 THV via the TF approach on 07/06/19. Post operative echo showed EF 35-40%, normally functioning TAVR with mean gradient of 17 mm Hg and no PVL. Groin sites are stable. Started on Asprin and Plavix. Plavix will be held x 2 days after pacemaker placement.   CHB: patient was high risk with a pre existing RBBB and long first degree AV block with loop recorder in place after previous syncope. After TAVR he developed CHB which has been persistent. He is now s/p successful implantation of a St. Jude BiV pacemaker and loop removal by Daniel Valenzuela. Wound check set up for next week the same day he comes in to see me.  Acute on chronic combined S/D CHF: as evidenced by an elevated BNP on pre admission lab work and mild cardiomegaly on CXR. This has been treated with TAVR. He has been resumed on home lasix 60mg   daily. He was recently taken off his potassium supplementation by his PCP due to hyperkalemia. Will check BMET next week.  NICM: EF remains 35-40%. Resume home Toprol XL 25mg  daily. ARB was  discontinued last admission 2/2 hypotension. Will try to add back low dose in the outpatient setting if BP stable.   IDDM: treated with SSI while admitted. Resume home regimen at discharge.   Incidental findings: pre TAVR CT showed   multiple small pulmonary nodules in the right lung--> Non-contrast chest CT at 3-6 months is recommended.   2.1 cm intermediate attenuation lesion in the right kidney, incompletely characterized--> nonemergent abdominal MRI with and without IV gadolinium is recommended in the near future. These will be discussed as an outpatient.  _____________  Discharge Vitals Blood pressure 134/69, pulse 76, temperature 98.9 F (37.2 C), temperature source Oral, resp. rate 15, height 5\' 11"  (1.803 m), weight (!) 171.3 kg, SpO2 98 %.  Filed Weights   07/06/19 0611 07/07/19 0600 07/08/19 0530  Weight: (!) 168.9 kg (!) 168.1 kg (!) 171.3 kg    Labs & Radiologic Studies    CBC Recent Labs    07/07/19 0224 07/08/19 0600  WBC 9.2 7.3  HGB 12.8* 12.0*  HCT 40.6 38.1*  MCV 89.6 90.5  PLT 176 148*   Basic Metabolic Panel Recent Labs    16/04/9611/09/20 0224 07/08/19 0600  NA 140 139  K 3.8 3.6  CL 101 102  CO2 27 28  GLUCOSE 161* 193*  BUN 9 7*  CREATININE 0.82 0.70  CALCIUM 8.7* 8.3*  MG 1.5* 1.9   Liver Function Tests No results for input(s): AST, ALT, ALKPHOS, BILITOT, PROT, ALBUMIN in the last 72 hours. No results for input(s): LIPASE, AMYLASE in the last 72 hours. Cardiac Enzymes No results for input(s): CKTOTAL, CKMB, CKMBINDEX, TROPONINI in the last 72 hours. BNP Invalid input(s): POCBNP D-Dimer No results for input(s): DDIMER in the last 72 hours. Hemoglobin A1C No results for input(s): HGBA1C in the last 72 hours. Fasting Lipid Panel No results for input(s): CHOL, HDL, LDLCALC, TRIG, CHOLHDL, LDLDIRECT in the last 72 hours. Thyroid Function Tests No results for input(s): TSH, T4TOTAL, T3FREE, THYROIDAB in the last 72 hours.  Invalid input(s):  FREET3 _____________  DG Chest 2 View  Result Date: 07/02/2019 CLINICAL DATA:  Pre admitted for TAVR. EXAM: CHEST - 2 VIEW COMPARISON:  06/04/2019 FINDINGS: Lungs are adequately inflated without focal airspace consolidation or effusion. Loop recorder present over the left anterior chest wall. Cardiomediastinal silhouette and remainder of the exam is unchanged. IMPRESSION: No active cardiopulmonary disease. Electronically Signed   By: Elberta Fortisaniel  Boyle M.D.   On: 07/02/2019 14:58   DG Knee 1-2 Views Left  Result Date: 06/12/2019 CLINICAL DATA:  Fall EXAM: LEFT KNEE - 1-2 VIEW COMPARISON:  None. FINDINGS: Tricompartment degenerative changes within the left knee. No joint effusion. No acute bony abnormality. Specifically, no fracture, subluxation, or dislocation. IMPRESSION: No acute bony abnormality. Moderate tricompartment degenerative changes. Electronically Signed   By: Charlett NoseKevin  Dover M.D.   On: 06/12/2019 19:53   EP PPM/ICD IMPLANT  Result Date: 07/07/2019 CONCLUSIONS: 1. Successful implantation of a St. Jude BiV pacemaker for symptomatic complete AV block after TAVR 2. No early apparent complications. Daniel BuntingGregg Taylor, MD 6:56 PM 07/07/2019  CT 3D RECON AT SCANNER  Result Date: 06/09/2019 CLINICAL DATA:  Dental disease EXAM: CT MAXILLOFACIAL WITHOUT CONTRAST; 3-DIMENSIONAL CT IMAGE RENDERING ON ACQUISITION WORKSTATION TECHNIQUE: Multidetector CT imaging of the maxillofacial structures was performed. Multiplanar CT  image reconstructions were also generated. Three-dimensional volume rendering was performed on the acquisition workstation for better evaluation the mandible and teeth. COMPARISON:  None FINDINGS: Osseous: Streak artifact from dental amalgam. The left middle maxillary and mandibular molars are absent. There is a single right mandibular molar. No periapical lucencies. No sclerotic or destructive osseous lesion. Temporomandibular joints are unremarkable. Mastoid air cells are clear. Orbits:  Unremarkable. Sinuses: Minor paranasal sinus mucosal thickening. Soft tissues: Negative. Limited intracranial: No significant or unexpected finding. IMPRESSION: No significant dental disease identified within limitation of streak artifact. Electronically Signed   By: Guadlupe Spanish M.D.   On: 06/09/2019 15:05   CT CORONARY MORPH W/CTA COR W/SCORE W/CA W/CM &/OR WO/CM  Addendum Date: 06/09/2019   ADDENDUM REPORT: 06/09/2019 16:32 EXAM: OVER-READ INTERPRETATION  CT CHEST The following report is an over-read performed by radiologist Dr. Royal Piedra [facility] on 06/09/2019. This over-read does not include interpretation of cardiac or coronary anatomy or pathology. The cardiac CTA interpretation by the cardiologist is attached. COMPARISON:  None. FINDINGS: Extracardiac findings were described separately under dictation for contemporaneously obtained CTA chest, abdomen and pelvis. IMPRESSION: Please see separate dictation for contemporaneously obtained CTA chest, abdomen and pelvis 06/09/2019 for full description of relevant extracardiac findings. Electronically Signed   By: Trudie Reed M.D.   On: 06/09/2019 16:32   Result Date: 06/09/2019 CLINICAL DATA:  68 year old male with severe aortic stenosis being evaluated for a TAVR procedure. EXAM: Cardiac TAVR CT TECHNIQUE: The patient was scanned on a [employer]. A 120 kV retrospective scan was triggered in the descending thoracic aorta at 111 HU's. Gantry rotation speed was 250 msecs and collimation was .6 mm. No beta blockade or nitro were given. The 3D data set was reconstructed in 5% intervals of the R-R cycle. Systolic and diastolic phases were analyzed on a dedicated work station using MPR, MIP and VRT modes. The patient received 80 cc of contrast. FINDINGS: Aortic Valve: Trileaflet, with severely calcified and thickened leaflets and severely restricted leaflet opening. Aorta: Normal size with minimal atherosclerotic plaque  and calcifications. Sinotubular Junction: 32 x 31 mm Ascending Thoracic Aorta: 38 x 37 mm Aortic Arch: 32 x 31 mm Descending Thoracic Aorta: 28 x 26 mm Sinus of Valsalva Measurements: Non-coronary: 35 mm Right -coronary: 34 mm Left -coronary: 35 mm Coronary Artery Height above Annulus: Left Main: 15 mm Right Coronary: 17 mm Virtual Basal Annulus Measurements: Maximum/Minimum Diameter: 31.5 x 28.4 mm Mean Diameter: 29.8 mm Perimeter: 94.5 mm Area: 696 mm2 Optimum Fluoroscopic Angle for Delivery: RAO 8 CRA 8. IMPRESSION: 1. Trileaflet, with severely calcified and thickened leaflets and severely restricted leaflet opening. Aortic valve calcium score is 3437 consistent with severe aortic stenosis. Patient's scan quality is affected by patient's size, annular size might be overestimated and appears too large for a 29 mm Edwards-SAPIEN 3 valve. Given patient's size, this patient might benefit from a 34 mm Evolut R CoreValve. 2. Sufficient coronary to annulus distance. 3. Optimum Fluoroscopic Angle for Delivery: RAO 8 CRA 8. 4. No thrombus in the left atrial appendage. 5. Mildly dilated pulmonary artery measuring 33 mm. Electronically Signed: By: Tobias Alexander On: 06/09/2019 15:19   DG Chest Port 1 View  Result Date: 07/06/2019 CLINICAL DATA:  Follow-up TAVR EXAM: PORTABLE CHEST 1 VIEW COMPARISON:  07/02/2019 FINDINGS: Mild cardiomegaly. TAVR inapparent due to under penetration of the mediastinum. Mild pulmonary venous hypertension without frank edema. No infiltrate, collapse or effusion. Loop recorder in place. IMPRESSION: Mild  cardiomegaly. Mild pulmonary venous hypertension. No frank edema. No consolidation or collapse. Electronically Signed   By: Paulina Fusi M.D.   On: 07/06/2019 12:43   CT ANGIO CHEST AORTA W/CM &/OR WO/CM  Result Date: 06/09/2019 CLINICAL DATA:  68 year old male with history of severe aortic valve stenosis. Preprocedural study prior to potential transcatheter aortic valve replacement  (TAVR) procedure. EXAM: CT ANGIOGRAPHY CHEST, ABDOMEN AND PELVIS TECHNIQUE: Multidetector CT imaging through the chest, abdomen and pelvis was performed using the standard protocol during bolus administration of intravenous contrast. Multiplanar reconstructed images and MIPs were obtained and reviewed to evaluate the vascular anatomy. CONTRAST:  OMNIPAQUE IOHEXOL 350 MG/ML SOLN COMPARISON:  No priors. FINDINGS: CTA CHEST FINDINGS Cardiovascular: Heart size is enlarged. There is no significant pericardial fluid, thickening or pericardial calcification. There is aortic atherosclerosis, as well as atherosclerosis of the great vessels of the mediastinum and the coronary arteries, including calcified atherosclerotic plaque in the left main, left anterior descending, left circumflex and right coronary arteries. Severe thickening calcification of the aortic valve. Moderate calcifications of the mitral annulus. Mediastinum/Lymph Nodes: Multiple prominent borderline enlarged and mildly enlarged mediastinal lymph nodes, largest of which measures 1.5 cm in short axis in the right paratracheal nodal station. No hilar lymphadenopathy. Patulous distal esophagus. No axillary lymphadenopathy. Lungs/Pleura: Patchy multifocal areas of peribronchovascular ground-glass attenuation throughout the right lung with some peribronchovascular micronodularity, largest of which is in the right lower lobe measuring 8 x 5 mm (mean diameter of 6.5 mm). No other larger more suspicious appearing pulmonary nodules or masses are noted. No pleural effusions. Musculoskeletal/Soft Tissues: There are no aggressive appearing lytic or blastic lesions noted in the visualized portions of the skeleton. CTA ABDOMEN AND PELVIS FINDINGS Hepatobiliary: No suspicious cystic or solid hepatic lesions. No intra or extrahepatic biliary ductal dilatation. Gallbladder is normal in appearance. Pancreas: No pancreatic mass. No pancreatic ductal dilatation. No  pancreatic or peripancreatic fluid collections or inflammatory changes. Spleen: Unremarkable. Adrenals/Urinary Tract: 2.1 cm intermediate attenuation (38 HU) lesion (axial image 136 of series 15) in the posterior aspect of the interpolar region of the right kidney, incompletely characterized. Left kidney and bilateral adrenal glands are normal in appearance. No hydroureteronephrosis. Urinary bladder is nearly decompressed, with a Foley balloon catheter in place, and small amount of gas non dependently in the lumen of the urinary bladder which is presumably iatrogenic. Stomach/Bowel: LapBand in place around the proximal stomach. Stomach is otherwise unremarkable in appearance. No pathologic dilatation of small bowel or colon. Normal appendix. Vascular/Lymphatic: Mild aortic atherosclerosis, without evidence of aneurysm or dissection in the abdominal or pelvic vasculature. Vascular findings and measurements pertinent to potential TAVR procedure, as detailed below. No lymphadenopathy noted in the abdomen or pelvis. Reproductive: Prostate gland and seminal vesicles are unremarkable in appearance. Other: No significant volume of ascites.  No pneumoperitoneum. Musculoskeletal: There are no aggressive appearing lytic or blastic lesions noted in the visualized portions of the skeleton. VASCULAR MEASUREMENTS PERTINENT TO TAVR: AORTA: Minimal Aortic Diameter-18 x 18 mm Severity of Aortic Calcification-mild RIGHT PELVIS: Right Common Iliac Artery - Minimal Diameter-13.1 x 11.5 mm Tortuosity-mild Calcification-minimal Right External Iliac Artery - Minimal Diameter-8.4 x 7.9 mm Tortuosity-moderate to severe Calcification-none Right Common Femoral Artery - Minimal Diameter-10.0 x 9.2 mm Tortuosity-mild Calcification - none LEFT PELVIS: Left Common Iliac Artery - Minimal Diameter-13.4 x 11.6 mm Tortuosity-mild Calcification-minimal Left External Iliac Artery - Minimal Diameter-8.3 x 8.6 mm Tortuosity-moderate Calcification - none  Left Common Femoral Artery - Minimal Diameter-7.8 x 7.7  mm Tortuosity-mild Calcification - none Review of the MIP images confirms the above findings. IMPRESSION: 1. Vascular findings and measurements pertinent to potential TAVR procedure, as detailed above. 2. Severe thickening calcification of the aortic valve, compatible with the reported clinical history of severe aortic stenosis. 3. Aortic atherosclerosis, in addition to left main and 3 vessel coronary artery disease. 4. Cardiomegaly. 5. Multiple borderline enlarged and mildly enlarged mediastinal lymph nodes measuring up to 1.5 cm in the right paratracheal nodal station. This is nonspecific, but favored to be reactive given the findings in the right lung which likely reflect mild multilobar bronchopneumonia. 6. Multiple small pulmonary nodules in the right lung, largest of which has a mean diameter of 6.5 mm in the right lower lobe. Non-contrast chest CT at 3-6 months is recommended. If the nodules are stable at time of repeat CT, then future CT at 18-24 months (from today's scan) is considered optional for low-risk patients, but is recommended for high-risk patients. This recommendation follows the consensus statement: Guidelines for Management of Incidental Pulmonary Nodules Detected on CT Images: From the Fleischner Society 2017; Radiology 2017; 284:228-243. 7. 2.1 cm intermediate attenuation lesion in the right kidney, incompletely characterized on today's examination. The possibility of a small renal neoplasm is not excluded, and further characterization with nonemergent abdominal MRI with and without IV gadolinium is recommended in the near future. Electronically Signed   By: Trudie Reed M.D.   On: 06/09/2019 15:35   ECHOCARDIOGRAM COMPLETE  Result Date: 07/07/2019   ECHOCARDIOGRAM REPORT   Patient Name:   LACEY DOTSON Date of Exam: 07/07/2019 Medical Rec #:  [id number]      Height:       71.0 in Accession #:    [id number]     Weight:       370.6  lb Date of Birth:  August 22, 1950      BSA:          2.74 m Patient Age:    68 years       BP:           80/30 mmHg Patient Gender: M              HR:           60 bpm. Exam Location:  Inpatient Procedure: 2D Echo, Cardiac Doppler, Color Doppler and Intracardiac            Opacification Agent Indications:    Post TAVR evaluation V43.3  History:        Patient has prior history of Echocardiogram examinations, most                 recent 07/06/2019. CAD, Aortic Valve Disease, Arrythmias:RBBB,                 Signs/Symptoms:Syncope; Risk Factors:Dyslipidemia. Aortic Valve:                 A 29mm Edwards Sapien bioprosthetic, stented aortic valve (TAVR)                 Procedure Date: 07/06/2019  Sonographer:    Tonia Ghent RDCS Referring Phys: [id number] [facility] R Hipolito Martinezlopez  Sonographer Comments: Technically difficult study due to poor echo windows. Image acquisition challenging due to patient body habitus. IMPRESSIONS  1. 29 mm S3 TAVR. Vmax 2.8 m/s. Mean gradient 17 mmHG. EOA 1.29 cm2. Normal functioning prosthetic valve without paravalvular leak detected.  2. Left ventricular ejection fraction, by visual estimation, is 35 to 40%.  The left ventricle has moderately decreased function. There is no left ventricular hypertrophy.  3. Severe hypokinesis of the apex. Global hypokinesis in other segments.  4. Definity contrast agent was given IV to delineate the left ventricular endocardial borders.  5. Abnormal septal motion consistent with RV pacemaker.  6. Left ventricular diastolic function could not be evaluated.  7. Global right ventricle has low normal systolic function.The right ventricular size is normal. No increase in right ventricular wall thickness.  8. Left atrial size was mildly dilated.  9. Right atrial size was normal. 10. Presence of pericardial fat pad. 11. Mild to moderate mitral annular calcification. 12. The mitral valve is degenerative. No evidence of mitral valve regurgitation. No evidence of mitral  stenosis. 13. Aortic valve regurgitation is not visualized. 14. The pulmonic valve was not well visualized. Pulmonic valve regurgitation is not visualized. 15. The inferior vena cava is dilated in size with >50% respiratory variability, suggesting right atrial pressure of 8 mmHg. 16. The tricuspid valve is not well visualized. Tricuspid valve regurgitation is not demonstrated. 17. TR signal is inadequate for assessing pulmonary artery systolic pressure. 18. Complete heart block present with RV apical pacing. FINDINGS  Left Ventricle: Left ventricular ejection fraction, by visual estimation, is 35 to 40%. The left ventricle has moderately decreased function. Definity contrast agent was given IV to delineate the left ventricular endocardial borders. The left ventricle is not well visualized. There is no left ventricular hypertrophy. Abnormal (paradoxical) septal motion, consistent with RV pacemaker. The left ventricular diastology could not be evaluated due to nondiagnostic images. Left ventricular diastolic function could not be evaluated. Severe hypokinesis of the apex. Global hypokinesis in other segments. Right Ventricle: The right ventricular size is normal. No increase in right ventricular wall thickness. Global RV systolic function is has low normal systolic function. Left Atrium: Left atrial size was mildly dilated. Right Atrium: Right atrial size was normal in size Pericardium: There is no evidence of pericardial effusion. Presence of pericardial fat pad. Mitral Valve: The mitral valve is degenerative in appearance. Mild to moderate mitral annular calcification. No evidence of mitral valve stenosis by observation. No evidence of mitral valve regurgitation. Tricuspid Valve: The tricuspid valve is not well visualized. Tricuspid valve regurgitation is not demonstrated. Aortic Valve: The aortic valve has been repaired/replaced. Aortic valve regurgitation is not visualized. Aortic valve mean gradient measures 17.7  mmHg. Aortic valve peak gradient measures 30.3 mmHg. Aortic valve area, by VTI measures 1.29 cm. 12mm Edwards Sapien bioprosthetic, stented aortic valve (TAVR) valve is present in the aortic position. Procedure Date: 07/06/2019. 29 mm S3 TAVR. Vmax 2.8 m/s. Mean gradient 17 mmHG. EOA 1.29 cm2. Normal functioning prosthetic valve without paravalvular leak  detected. Pulmonic Valve: The pulmonic valve was not well visualized. Pulmonic valve regurgitation is not visualized. Aorta: The aortic root is normal in size and structure. Venous: The inferior vena cava is dilated in size with greater than 50% respiratory variability, suggesting right atrial pressure of 8 mmHg. IAS/Shunts: No atrial level shunt detected by color flow Doppler.  LEFT VENTRICLE PLAX 2D LVOT diam:     2.10 cm LVOT Area:     3.46 cm  LV Volumes (MOD) LV area d, A4C:    41.30 cm LV area s, A4C:    32.30 cm LV major d, A4C:   9.11 cm LV major s, A4C:   8.62 cm LV vol d, MOD A4C: 148.0 ml LV vol s, MOD A4C: 99.4 ml LV SV MOD A4C:  148.0 ml RIGHT VENTRICLE TAPSE (M-mode): 1.8 cm LEFT ATRIUM             Index       RIGHT ATRIUM           Index LA Vol (A2C):   61.8 ml 22.55 ml/m RA Area:     19.40 cm LA Vol (A4C):   71.3 ml 26.02 ml/m RA Volume:   46.80 ml  17.08 ml/m LA Biplane Vol: 68.0 ml 24.81 ml/m  AORTIC VALVE AV Area (Vmax):    1.27 cm AV Area (Vmean):   1.33 cm AV Area (VTI):     1.29 cm AV Vmax:           275.33 cm/s AV Vmean:          194.667 cm/s AV VTI:            0.543 m AV Peak Grad:      30.3 mmHg AV Mean Grad:      17.7 mmHg LVOT Vmax:         101.00 cm/s LVOT Vmean:        74.700 cm/s LVOT VTI:          0.202 m LVOT/AV VTI ratio: 0.37  SHUNTS Systemic VTI:  0.20 m Systemic Diam: 2.10 cm  Lennie Odor MD Electronically signed by Lennie Odor MD Signature Date/Time: 07/07/2019/9:36:08 AM    Final    CUP PACEART REMOTE DEVICE CHECK  Result Date: 06/21/2019 [facility] summary report received. Battery status OK. Normal device  function. No new symptom episodes, tachy episodes, brady, or pause episodes. No new AF episodes. Monthly summary reports and ROV/PRN  VAS US CAROTID  Result Date: 06/08/2019 Carotid Arterial Duplex Study Indications: Pre TAVR. Limitations  Today's exam was limited due to the body habitus of the patient. Performing Technologist: Levada Schilling RDMS, RVT  Examination Guidelines: A complete evaluation includes B-mode imaging, spectral Doppler, color Doppler, and power Doppler as needed of all accessible portions of each vessel. Bilateral testing is considered an integral part of a complete examination. Limited examinations for reoccurring indications may be performed as noted.  Right Carotid Findings: +----------+--------+--------+--------+------------------+--------+             PSV cm/s EDV cm/s Stenosis Plaque Description Comments  +----------+--------+--------+--------+------------------+--------+  CCA Prox   57       12                                             +----------+--------+--------+--------+------------------+--------+  CCA Distal 29       8                                              +----------+--------+--------+--------+------------------+--------+  ICA Prox   67       25                                             +----------+--------+--------+--------+------------------+--------+  ICA Distal 63       28                                             +----------+--------+--------+--------+------------------+--------+  ECA        62       17                                             +----------+--------+--------+--------+------------------+--------+ +----------+--------+-------+----------------+-------------------+             PSV cm/s EDV cms Describe         Arm Pressure (mmHG)  +----------+--------+-------+----------------+-------------------+  Subclavian 78               Multiphasic, WNL                      +----------+--------+-------+----------------+-------------------+  +---------+--------+--+--------+--+---------+  Vertebral PSV cm/s 32 EDV cm/s 11 Antegrade  +---------+--------+--+--------+--+---------+  Left Carotid Findings: +----------+--------+--------+--------+------------------+--------+             PSV cm/s EDV cm/s Stenosis Plaque Description Comments  +----------+--------+--------+--------+------------------+--------+  CCA Prox   56       14                                             +----------+--------+--------+--------+------------------+--------+  CCA Distal 39       13                                             +----------+--------+--------+--------+------------------+--------+  ICA Prox   66       28                                             +----------+--------+--------+--------+------------------+--------+  ICA Distal 71       29                                             +----------+--------+--------+--------+------------------+--------+  ECA        53       7                                              +----------+--------+--------+--------+------------------+--------+ +----------+--------+--------+----------------+-------------------+             PSV cm/s EDV cm/s Describe         Arm Pressure (mmHG)  +----------+--------+--------+----------------+-------------------+  Subclavian 109               Multiphasic, WNL                      +----------+--------+--------+----------------+-------------------+ +---------+--------+--+--------+-+---------+  Vertebral PSV cm/s 28 EDV cm/s 9 Antegrade  +---------+--------+--+--------+-+---------+  Summary: Right Carotid: Velocities in the right ICA are consistent with a 1-39% stenosis. Left Carotid: Velocities in the left ICA are consistent with a 1-39% stenosis. Vertebrals: Bilateral vertebral arteries demonstrate antegrade flow. *See table(s) above for measurements and observations.  Electronically signed by Coral Else MD on 06/08/2019 at  5:53:09 PM.    Final    ECHOCARDIOGRAM LIMITED  Result Date:  07/06/2019   ECHOCARDIOGRAM LIMITED REPORT   Patient Name:   LAVERE STORK Date of Exam: 07/06/2019 Medical Rec #:  [id number]      Height:       71.0 in Accession #:    [id number]     Weight:       372.3 lb Date of Birth:  Jan 29, 1951      BSA:          2.75 m Patient Age:    68 years       BP:           129/75 mmHg Patient Gender: M              HR:           82 bpm. Exam Location:  Inpatient  Procedure: Limited Echo, Cardiac Doppler and Color Doppler Indications:     I35.0 Nonrheumatic aortic (valve) stenosis  History:         Patient has prior history of Echocardiogram examinations, most                  recent 07/05/2019. Severe aortic stenosis.  Sonographer:     Sheralyn Boatman RDCS Referring Phys:  [id number] [facility] Diagnosing Phys: Charlton Haws MD  Sonographer Comments: Suboptimal parasternal window, suboptimal apical window, Technically difficult study due to poor echo windows and Technically challenging study due to limited acoustic windows. Image acquisition challenging due to patient body habitus. TAVR Procedure. IMPRESSIONS  1. Left ventricular ejection fraction, by visual estimation, is 30 to 35%. The left ventricle has moderate to severely decreased function. There is mildly increased left ventricular hypertrophy.  2. Global right ventricle was not well visualized.The right ventricular size is not well visualized. Right vetricular wall thickness was not assessed.  3. Left atrial size was not well visualized.  4. Right atrial size was not well visualized.  5. Moderate calcification of the mitral valve leaflet(s).  6. Moderate thickening of the mitral valve leaflet(s).  7. Severe mitral annular calcification.  8. The mitral valve is normal in structure. Mild mitral valve regurgitation.  9. The tricuspid valve is not well visualized. Tricuspid valve regurgitation is mild. 10. Aortic valve regurgitation is not visualized. 11. Very poor images due to obesity with patient supine in OR. 29 mm Sapien 3 valve  placed with no obvious PVL and poor CW signals but no high gradietns noted. 12. The pulmonic valve was not well visualized. Pulmonic valve regurgitation is not visualized. 13. The aortic root was not well visualized. 14. The interatrial septum was not well visualized. FINDINGS  Left Ventricle: Left ventricular ejection fraction, by visual estimation, is 30 to 35%. The left ventricle has moderate to severely decreased function. The left ventricle is not well visualized. There is mildly increased left ventricular hypertrophy. Right Ventricle: The right ventricular size is not well visualized. Right vetricular wall thickness was not assessed. Global RV systolic function is was not well visualized. Left Atrium: Left atrial size was not well visualized. Right Atrium: Right atrial size was not well visualized Pericardium: There is no evidence of pericardial effusion. Mitral Valve: The mitral valve is normal in structure. There is moderate thickening of the mitral valve leaflet(s). There is moderate calcification of the mitral valve leaflet(s). Severe mitral annular calcification. Mild mitral valve regurgitation. Tricuspid Valve: The tricuspid valve is not well visualized. Tricuspid valve regurgitation is mild. Aortic  Valve: The aortic valve has been repaired/replaced. Aortic valve regurgitation is not visualized. Aortic valve mean gradient measures 1.0 mmHg. Aortic valve peak gradient measures 2.1 mmHg. Very poor images due to obesity with patient supine in OR. 29 mm Sapien 3 valve placed with no obvious PVL and poor CW signals but no high gradietns noted. Pulmonic Valve: The pulmonic valve was not well visualized. Pulmonic valve regurgitation is not visualized. Aorta: The aortic root was not well visualized. Shunts: The interatrial septum was not well visualized.  LEFT VENTRICLE          Normals PLAX 2D LVOT diam:     2.00 cm  2.0 cm LVOT Area:     3.14 cm 3.14 cm2  AORTIC VALVE              Normals AV Vmax:      72.00  cm/s AV Vmean:     49.000 cm/s 77 cm/s AV VTI:       0.186 m     3.15 cm2 AV Peak Grad: 2.1 mmHg AV Mean Grad: 1.0 mmHg    3 mmHg  SHUNTS Systemic Diam: 2.00 cm  Jenkins Rouge MD Electronically signed by Jenkins Rouge MD Signature Date/Time: 07/06/2019/3:00:14 PM    Final    Structural Heart Procedure  Result Date: 07/06/2019 See surgical note for result.  CT MAXILLOFACIAL WO CONTRAST  Result Date: 06/09/2019 CLINICAL DATA:  Dental disease EXAM: CT MAXILLOFACIAL WITHOUT CONTRAST; 3-DIMENSIONAL CT IMAGE RENDERING ON ACQUISITION WORKSTATION TECHNIQUE: Multidetector CT imaging of the maxillofacial structures was performed. Multiplanar CT image reconstructions were also generated. Three-dimensional volume rendering was performed on the acquisition workstation for better evaluation the mandible and teeth. COMPARISON:  None FINDINGS: Osseous: Streak artifact from dental amalgam. The left middle maxillary and mandibular molars are absent. There is a single right mandibular molar. No periapical lucencies. No sclerotic or destructive osseous lesion. Temporomandibular joints are unremarkable. Mastoid air cells are clear. Orbits: Unremarkable. Sinuses: Minor paranasal sinus mucosal thickening. Soft tissues: Negative. Limited intracranial: No significant or unexpected finding. IMPRESSION: No significant dental disease identified within limitation of streak artifact. Electronically Signed   By: Macy Mis M.D.   On: 06/09/2019 15:05   CT Angio Abd/Pel w/ and/or w/o  Result Date: 06/09/2019 CLINICAL DATA:  68 year old male with history of severe aortic valve stenosis. Preprocedural study prior to potential transcatheter aortic valve replacement (TAVR) procedure. EXAM: CT ANGIOGRAPHY CHEST, ABDOMEN AND PELVIS TECHNIQUE: Multidetector CT imaging through the chest, abdomen and pelvis was performed using the standard protocol during bolus administration of intravenous contrast. Multiplanar reconstructed images and MIPs  were obtained and reviewed to evaluate the vascular anatomy. CONTRAST:  166mL OMNIPAQUE IOHEXOL 350 MG/ML SOLN COMPARISON:  No priors. FINDINGS: CTA CHEST FINDINGS Cardiovascular: Heart size is enlarged. There is no significant pericardial fluid, thickening or pericardial calcification. There is aortic atherosclerosis, as well as atherosclerosis of the great vessels of the mediastinum and the coronary arteries, including calcified atherosclerotic plaque in the left main, left anterior descending, left circumflex and right coronary arteries. Severe thickening calcification of the aortic valve. Moderate calcifications of the mitral annulus. Mediastinum/Lymph Nodes: Multiple prominent borderline enlarged and mildly enlarged mediastinal lymph nodes, largest of which measures 1.5 cm in short axis in the right paratracheal nodal station. No hilar lymphadenopathy. Patulous distal esophagus. No axillary lymphadenopathy. Lungs/Pleura: Patchy multifocal areas of peribronchovascular ground-glass attenuation throughout the right lung with some peribronchovascular micronodularity, largest of which is in the right lower lobe measuring 8 x  5 mm (mean diameter of 6.5 mm). No other larger more suspicious appearing pulmonary nodules or masses are noted. No pleural effusions. Musculoskeletal/Soft Tissues: There are no aggressive appearing lytic or blastic lesions noted in the visualized portions of the skeleton. CTA ABDOMEN AND PELVIS FINDINGS Hepatobiliary: No suspicious cystic or solid hepatic lesions. No intra or extrahepatic biliary ductal dilatation. Gallbladder is normal in appearance. Pancreas: No pancreatic mass. No pancreatic ductal dilatation. No pancreatic or peripancreatic fluid collections or inflammatory changes. Spleen: Unremarkable. Adrenals/Urinary Tract: 2.1 cm intermediate attenuation (38 HU) lesion (axial image 136 of series 15) in the posterior aspect of the interpolar region of the right kidney, incompletely  characterized. Left kidney and bilateral adrenal glands are normal in appearance. No hydroureteronephrosis. Urinary bladder is nearly decompressed, with a Foley balloon catheter in place, and small amount of gas non dependently in the lumen of the urinary bladder which is presumably iatrogenic. Stomach/Bowel: LapBand in place around the proximal stomach. Stomach is otherwise unremarkable in appearance. No pathologic dilatation of small bowel or colon. Normal appendix. Vascular/Lymphatic: Mild aortic atherosclerosis, without evidence of aneurysm or dissection in the abdominal or pelvic vasculature. Vascular findings and measurements pertinent to potential TAVR procedure, as detailed below. No lymphadenopathy noted in the abdomen or pelvis. Reproductive: Prostate gland and seminal vesicles are unremarkable in appearance. Other: No significant volume of ascites.  No pneumoperitoneum. Musculoskeletal: There are no aggressive appearing lytic or blastic lesions noted in the visualized portions of the skeleton. VASCULAR MEASUREMENTS PERTINENT TO TAVR: AORTA: Minimal Aortic Diameter-18 x 18 mm Severity of Aortic Calcification-mild RIGHT PELVIS: Right Common Iliac Artery - Minimal Diameter-13.1 x 11.5 mm Tortuosity-mild Calcification-minimal Right External Iliac Artery - Minimal Diameter-8.4 x 7.9 mm Tortuosity-moderate to severe Calcification-none Right Common Femoral Artery - Minimal Diameter-10.0 x 9.2 mm Tortuosity-mild Calcification - none LEFT PELVIS: Left Common Iliac Artery - Minimal Diameter-13.4 x 11.6 mm Tortuosity-mild Calcification-minimal Left External Iliac Artery - Minimal Diameter-8.3 x 8.6 mm Tortuosity-moderate Calcification - none Left Common Femoral Artery - Minimal Diameter-7.8 x 7.7 mm Tortuosity-mild Calcification - none Review of the MIP images confirms the above findings. IMPRESSION: 1. Vascular findings and measurements pertinent to potential TAVR procedure, as detailed above. 2. Severe thickening  calcification of the aortic valve, compatible with the reported clinical history of severe aortic stenosis. 3. Aortic atherosclerosis, in addition to left main and 3 vessel coronary artery disease. 4. Cardiomegaly. 5. Multiple borderline enlarged and mildly enlarged mediastinal lymph nodes measuring up to 1.5 cm in the right paratracheal nodal station. This is nonspecific, but favored to be reactive given the findings in the right lung which likely reflect mild multilobar bronchopneumonia. 6. Multiple small pulmonary nodules in the right lung, largest of which has a mean diameter of 6.5 mm in the right lower lobe. Non-contrast chest CT at 3-6 months is recommended. If the nodules are stable at time of repeat CT, then future CT at 18-24 months (from today's scan) is considered optional for low-risk patients, but is recommended for high-risk patients. This recommendation follows the consensus statement: Guidelines for Management of Incidental Pulmonary Nodules Detected on CT Images: From the Fleischner Society 2017; Radiology 2017; 284:228-243. 7. 2.1 cm intermediate attenuation lesion in the right kidney, incompletely characterized on today's examination. The possibility of a small renal neoplasm is not excluded, and further characterization with nonemergent abdominal MRI with and without IV gadolinium is recommended in the near future. Electronically Signed   By: Trudie Reedaniel  Entrikin M.D.   On: 06/09/2019 15:35  Disposition   Pt is being discharged home today in good condition.  Follow-up Plans & Appointments    Follow-up Information    Janetta Hora, PA-C. Go on 07/15/2019.   Specialties: Cardiology, Radiology Why: @ 2:00pm, please arrive at least 10 minutes early. This will be followed by a pacemaker wound check in the device clinic at 3:30pm. Contact information: [address] [place] [address] [phone]            Discharge Medications   Allergies as of  07/08/2019      Reactions   Methyldopa-hydrochlorothiazide Other (See Comments)   GYNOCOMASTIA   Motrin [ibuprofen] Other (See Comments)   GYNOCOMASTIA   Nsaids Other (See Comments)   Patient has had lap band surgery- cannot tolerate this class of meds   Amoxicillin Rash   Did it involve swelling of the face/tongue/throat, SOB, or low BP? No Did it involve sudden or severe rash/hives, skin peeling, or any reaction on the inside of your mouth or nose? Yes Did you need to seek medical attention at a hospital or doctor's office? No When did it last happen?More than 30 years ago If all above answers are NO, may proceed with cephalosporin use. .      Medication List    STOP taking these medications   potassium chloride SA 20 MEQ tablet Commonly known as: KLOR-CON     TAKE these medications   aspirin 81 MG chewable tablet Chew 1 tablet (81 mg total) by mouth daily.   atorvastatin 40 MG tablet Commonly known as: LIPITOR Take 40 mg by mouth daily.   buPROPion 300 MG 24 hr tablet Commonly known as: WELLBUTRIN XL Take 300 mg by mouth daily.   CAL-MAG-ZINC PO Take 1 tablet by mouth daily with breakfast.   chlorhexidine 0.12 % solution Commonly known as: PERIDEX Use as directed 15 mLs in the mouth or throat 2 (two) times daily.   clopidogrel 75 MG tablet Commonly known as: Plavix Take 1 tablet (75 mg total) by mouth daily. Start taking on: July 10, 2019   escitalopram 20 MG tablet Commonly known as: LEXAPRO Take 20 mg by mouth daily.   furosemide 40 MG tablet Commonly known as: LASIX Take 1.5 tablets (60 mg total) by mouth daily.   gabapentin 600 MG tablet Commonly known as: NEURONTIN Take 600-1,200 mg by mouth See admin instructions. Take 1 tablet (600 mg) by mouth every morning & take 2 tablets (1200 mg) by mouth at bedtime.   HYDROcodone-acetaminophen 5-325 MG tablet Commonly known as: NORCO/VICODIN Take 1 tablet by mouth 4 (four) times daily as needed  (pain.).   metoprolol succinate 25 MG 24 hr tablet Commonly known as: TOPROL-XL Take 25 mg by mouth daily.   multivitamin with minerals Tabs tablet Take 1 tablet by mouth daily. One-A-Day Multivitamin   NovoLIN N ReliOn 100 UNIT/ML injection Generic drug: insulin NPH Human Inject 10-40 Units into the skin 2 (two) times daily. *Hold for blood glucose reading less than 140*   NovoLIN R ReliOn 100 units/mL injection Generic drug: insulin regular Inject 0-20 Units into the skin See admin instructions. Sliding Scale Insulin two to three times a day before meals   pantoprazole 40 MG tablet Commonly known as: PROTONIX Take 40 mg by mouth daily.   phenazopyridine 100 MG tablet Commonly known as: PYRIDIUM Take 1 tablet (100 mg total) by mouth 3 (three) times daily with meals.   tamsulosin 0.4 MG Caps capsule Commonly known as:  FLOMAX Take 1 capsule (0.4 mg total) by mouth daily after supper.   vitamin B-12 1000 MCG tablet Commonly known as: CYANOCOBALAMIN Take 1,000 mcg by mouth daily.           Outstanding Labs/Studies   BMET  Duration of Discharge Encounter   Greater than 30 minutes including physician time.  Byrd Hesselbach, PA-C 07/08/2019, 9:59 AM [phone]

## 2019-07-08 NOTE — Discharge Instructions (Signed)
ACTIVITY AND EXERCISE °• Daily activity and exercise are an important part of your recovery. People recover at different rates depending on their general health and type of valve procedure. °• Most people recovering from TAVR feel better relatively quickly  °• No lifting, pushing, pulling more than 10 pounds (examples to avoid: groceries, vacuuming, gardening, golfing): °            - For one week with a procedure through the groin. °            - For six weeks for procedures through the chest wall or neck. °NOTE: You will typically see one of our providers 7-14 days after your procedure to discuss WHEN TO RESUME the above activities.  °  °  °DRIVING °• Do not drive until you are seen for follow up and cleared by a provider. Generally, we ask patient to not drive for 1 week after their procedure. °• If you have been told by your doctor in the past that you may not drive, you must talk with him/her before you begin driving again. °  °DRESSING °• Groin site: you may leave the clear dressing over the site for up to one week or until it falls off. °  °HYGIENE °• If you had a femoral (leg) procedure, you may take a shower when you return home. After the shower, pat the site dry. Do NOT use powder, oils or lotions in your groin area until the site has completely healed. °• If you had a chest procedure, you may shower when you return home unless specifically instructed not to by your discharging practitioner. °            - DO NOT scrub incision; pat dry with a towel. °            - DO NOT apply any lotions, oils, powders to the incision. °            - No tub baths / swimming for at least 2 weeks. °• If you notice any fevers, chills, increased pain, swelling, bleeding or pus, please contact your doctor. °  °ADDITIONAL INFORMATION °• If you are going to have an upcoming dental procedure, please contact our office as you will require antibiotics ahead of time to prevent infection on your heart valve.  ° ° °If you have any  questions or concerns you can call the structural heart phone during normal business hours 8am-4pm. If you have an urgent need after hours or weekends please call [phone] to talk to the on call provider for general cardiology. If you have an emergency that requires immediate attention, please call 911.  ° ° °After TAVR Checklist ° °Check  Test Description  ° Follow up appointment in 1-2 weeks  You will see our structural heart physician assistant, Daniel Valenzuela. Your incision sites will be checked and you will be cleared to drive and resume all normal activities if you are doing well.    ° 1 month echo and follow up  You will have an echo to check on your new heart valve and be seen back in the office by Daniel Valenzuela. Many times the echo is not read by your appointment time, but Daniel will call you later that day or the following day to report your results.  ° Follow up with your primary cardiologist You will need to be seen by your primary cardiologist in the following 3-6 months after your 1 month appointment in the valve   clinic. Often times your Plavix or Aspirin will be discontinued during this time, but this is decided on a case by case basis.    1 year echo and follow up You will have another echo to check on your heart valve after 1 year and be seen back in the office by Daniel Valenzuela. This your last structural heart visit.   Bacterial endocarditis prophylaxis  You will have to take antibiotics for the rest of your life before all dental procedures (even teeth cleanings) to protect your heart valve. Antibiotics are also required before some surgeries. Please check with your cardiologist before scheduling any surgeries. Also, please make sure to tell us if you have a penicillin allergy as you will require an alternative antibiotic.         Supplemental Discharge Instructions for  Pacemaker/Defibrillator Patients  Activity No heavy lifting or vigorous activity with your left/right arm for  6 to 8 weeks.  Do not raise your left/right arm above your head for one week.  Gradually raise your affected arm as drawn below.              07/11/2019              07/12/2019              07/13/2019            07/14/2019 __  NO DRIVING for  1 week   ; you may begin driving on  93/57/0177, longer if directed by Dr. Angelena Form  .  WOUND CARE - Keep the wound area clean and dry.  Do not get this area wet for one week. No showers for one week; you may shower on 07/14/2019  . - The tape/steri-strips on your wound will fall off; do not pull them off.  No bandage is needed on the site.  DO  NOT apply any creams, oils, or ointments to the wound area. - If you notice any drainage or discharge from the wound, any swelling or bruising at the site, or you develop a fever > 101? F after you are discharged home, call the office at once.  Special Instructions - You are still able to use cellular telephones; use the ear opposite the side where you have your pacemaker/defibrillator.  Avoid carrying your cellular phone near your device. - When traveling through airports, show security personnel your identification card to avoid being screened in the metal detectors.  Ask the security personnel to use the hand wand. - Avoid arc welding equipment, MRI testing (magnetic resonance imaging), TENS units (transcutaneous nerve stimulators).  Call the office for questions about other devices. - Avoid electrical appliances that are in poor condition or are not properly grounded. - Microwave ovens are safe to be near or to operate.

## 2019-07-08 NOTE — Progress Notes (Signed)
Progress Note  Patient Name: Daniel Valenzuela Date of Encounter: 07/08/2019  Primary Cardiologist: Cristopher Peru, MD   Subjective   No chest pain or dyspnea. No complaints.   Inpatient Medications    Scheduled Meds:  aspirin  81 mg Oral Daily   atorvastatin  40 mg Oral Daily   buPROPion  300 mg Oral Daily   Chlorhexidine Gluconate Cloth  6 each Topical Q0600   escitalopram  20 mg Oral Daily   furosemide  60 mg Oral Daily   gabapentin  600 mg Oral Daily   And   gabapentin  1,200 mg Oral QHS   insulin aspart  0-24 Units Subcutaneous TID AC & HS   mupirocin ointment  1 application Nasal BID   pantoprazole  40 mg Oral Daily   potassium chloride SA  20 mEq Oral Daily   silver sulfADIAZINE   Topical BID   sodium chloride flush  3 mL Intravenous Q12H   sodium chloride flush  3 mL Intravenous Q12H   tamsulosin  0.4 mg Oral QPC supper   Continuous Infusions:  sodium chloride     nitroGLYCERIN Stopped (07/06/19 1735)   phenylephrine (NEO-SYNEPHRINE) Adult infusion Stopped (07/06/19 1735)   vancomycin     PRN Meds: sodium chloride, acetaminophen **OR** acetaminophen, acetaminophen, morphine injection, ondansetron (ZOFRAN) IV, sodium chloride flush, traMADol   Vital Signs    Vitals:   07/08/19 0500 07/08/19 0530 07/08/19 0600 07/08/19 0700  BP: 135/68  (!) 124/110 (!) 128/57  Pulse: 76  76 77  Resp: 18  (!) 25 16  Temp:      TempSrc:      SpO2: 96%  98% 99%  Weight:  (!) 171.3 kg    Height:        Intake/Output Summary (Last 24 hours) at 07/08/2019 0716 Last data filed at 07/07/2019 2100 Gross per 24 hour  Intake 830.25 ml  Output 0 ml  Net 830.25 ml   Last 3 Weights 07/08/2019 07/07/2019 07/06/2019  Weight (lbs) 377 lb 10.4 oz 370 lb 9.5 oz 372 lb 5 oz  Weight (kg) 171.3 kg 168.1 kg 168.88 kg      Telemetry    paced - Personally Reviewed  ECG    BiV pacing - Personally Reviewed  Physical Exam   GEN: Obese male, NAD Neck: No  JVD Cardiac: RRR, no murmurs, rubs, or gallops.  Respiratory: Clear to auscultation bilaterally. GI: Soft, nontender, non-distended  MS: No edema; No deformity. Neuro:  Nonfocal  Psych: Normal affect   Labs    High Sensitivity Troponin:  No results for input(s): TROPONINIHS in the last 720 hours.    Chemistry Recent Labs  Lab 07/02/19 0946 07/06/19 1247 07/07/19 0224 07/08/19 0600  NA 138 140 140 139  K 4.1 4.0 3.8 3.6  CL 101 108 101 102  CO2 23  --  27 28  GLUCOSE 211* 92 161* 193*  BUN 11 14 9  7*  CREATININE 0.96 0.50* 0.82 0.70  CALCIUM 8.7*  --  8.7* 8.3*  PROT 7.1  --   --   --   ALBUMIN 3.2*  --   --   --   AST 24  --   --   --   ALT 22  --   --   --   ALKPHOS 99  --   --   --   BILITOT 1.1  --   --   --   GFRNONAA >60  --  >  60 >60  GFRAA >60  --  >60 >60  ANIONGAP 14  --  12 9     Hematology Recent Labs  Lab 07/02/19 0946 07/06/19 1247 07/07/19 0224 07/08/19 0600  WBC 9.1  --  9.2 7.3  RBC 5.05  --  4.53 4.21*  HGB 14.5 11.2* 12.8* 12.0*  HCT 45.4 33.0* 40.6 38.1*  MCV 89.9  --  89.6 90.5  MCH 28.7  --  28.3 28.5  MCHC 31.9  --  31.5 31.5  RDW 15.0  --  14.9 15.1  PLT 276  --  176 148*    BNP Recent Labs  Lab 07/02/19 0946  BNP 249.8*     DDimer No results for input(s): DDIMER in the last 168 hours.   Radiology    EP PPM/ICD IMPLANT  Result Date: 07/07/2019 CONCLUSIONS: 1. Successful implantation of a [employer] BiV pacemaker for symptomatic complete AV block after TAVR 2. No early apparent complications. Lewayne BuntingGregg Taylor, MD 6:56 PM 07/07/2019  DG Chest Port 1 View  Result Date: 07/06/2019 CLINICAL DATA:  Follow-up TAVR EXAM: PORTABLE CHEST 1 VIEW COMPARISON:  07/02/2019 FINDINGS: Mild cardiomegaly. TAVR inapparent due to under penetration of the mediastinum. Mild pulmonary venous hypertension without frank edema. No infiltrate, collapse or effusion. Loop recorder in place. IMPRESSION: Mild cardiomegaly. Mild pulmonary venous hypertension. No  frank edema. No consolidation or collapse. Electronically Signed   By: Paulina FusiMark  Shogry M.D.   On: 07/06/2019 12:43   ECHOCARDIOGRAM COMPLETE  Result Date: 07/07/2019   ECHOCARDIOGRAM REPORT   Patient Name:   Daniel Valenzuela Date of Exam: 07/07/2019 Medical Rec #:  [id number]      Height:       71.0 in Accession #:    [id number]     Weight:       370.6 lb Date of Birth:  08/29/1950      BSA:          2.74 m Patient Age:    68 years       BP:           80/30 mmHg Patient Gender: M              HR:           60 bpm. Exam Location:  Inpatient Procedure: 2D Echo, Cardiac Doppler, Color Doppler and Intracardiac            Opacification Agent Indications:    Post TAVR evaluation V43.3  History:        Patient has prior history of Echocardiogram examinations, most                 recent 07/06/2019. CAD, Aortic Valve Disease, Arrythmias:RBBB,                 Signs/Symptoms:Syncope; Risk Factors:Dyslipidemia. Aortic Valve:                 A 29mm Edwards Sapien bioprosthetic, stented aortic valve (TAVR)                 Procedure Date: 07/06/2019  Sonographer:    Tonia GhentJulia Underwood RDCS Referring Phys: [id number] [facility]  Sonographer Comments: Technically difficult study due to poor echo windows. Image acquisition challenging due to patient body habitus. IMPRESSIONS  1. 29 mm S3 TAVR. Vmax 2.8 m/s. Mean gradient 17 mmHG. EOA 1.29 cm2. Normal functioning prosthetic valve without paravalvular leak detected.  2. Left ventricular ejection fraction, by visual estimation, is 35  to 40%. The left ventricle has moderately decreased function. There is no left ventricular hypertrophy.  3. Severe hypokinesis of the apex. Global hypokinesis in other segments.  4. Definity contrast agent was given IV to delineate the left ventricular endocardial borders.  5. Abnormal septal motion consistent with RV pacemaker.  6. Left ventricular diastolic function could not be evaluated.  7. Global right ventricle has low normal systolic function.The  right ventricular size is normal. No increase in right ventricular wall thickness.  8. Left atrial size was mildly dilated.  9. Right atrial size was normal. 10. Presence of pericardial fat pad. 11. Mild to moderate mitral annular calcification. 12. The mitral valve is degenerative. No evidence of mitral valve regurgitation. No evidence of mitral stenosis. 13. Aortic valve regurgitation is not visualized. 14. The pulmonic valve was not well visualized. Pulmonic valve regurgitation is not visualized. 15. The inferior vena cava is dilated in size with >50% respiratory variability, suggesting right atrial pressure of 8 mmHg. 16. The tricuspid valve is not well visualized. Tricuspid valve regurgitation is not demonstrated. 17. TR signal is inadequate for assessing pulmonary artery systolic pressure. 18. Complete heart block present with RV apical pacing. FINDINGS  Left Ventricle: Left ventricular ejection fraction, by visual estimation, is 35 to 40%. The left ventricle has moderately decreased function. Definity contrast agent was given IV to delineate the left ventricular endocardial borders. The left ventricle is not well visualized. There is no left ventricular hypertrophy. Abnormal (paradoxical) septal motion, consistent with RV pacemaker. The left ventricular diastology could not be evaluated due to nondiagnostic images. Left ventricular diastolic function could not be evaluated. Severe hypokinesis of the apex. Global hypokinesis in other segments. Right Ventricle: The right ventricular size is normal. No increase in right ventricular wall thickness. Global RV systolic function is has low normal systolic function. Left Atrium: Left atrial size was mildly dilated. Right Atrium: Right atrial size was normal in size Pericardium: There is no evidence of pericardial effusion. Presence of pericardial fat pad. Mitral Valve: The mitral valve is degenerative in appearance. Mild to moderate mitral annular calcification. No  evidence of mitral valve stenosis by observation. No evidence of mitral valve regurgitation. Tricuspid Valve: The tricuspid valve is not well visualized. Tricuspid valve regurgitation is not demonstrated. Aortic Valve: The aortic valve has been repaired/replaced. Aortic valve regurgitation is not visualized. Aortic valve mean gradient measures 17.7 mmHg. Aortic valve peak gradient measures 30.3 mmHg. Aortic valve area, by VTI measures 1.29 cm. 67mm Edwards Sapien bioprosthetic, stented aortic valve (TAVR) valve is present in the aortic position. Procedure Date: 07/06/2019. 29 mm S3 TAVR. Vmax 2.8 m/s. Mean gradient 17 mmHG. EOA 1.29 cm2. Normal functioning prosthetic valve without paravalvular leak  detected. Pulmonic Valve: The pulmonic valve was not well visualized. Pulmonic valve regurgitation is not visualized. Aorta: The aortic root is normal in size and structure. Venous: The inferior vena cava is dilated in size with greater than 50% respiratory variability, suggesting right atrial pressure of 8 mmHg. IAS/Shunts: No atrial level shunt detected by color flow Doppler.  LEFT VENTRICLE PLAX 2D LVOT diam:     2.10 cm LVOT Area:     3.46 cm  LV Volumes (MOD) LV area d, A4C:    41.30 cm LV area s, A4C:    32.30 cm LV major d, A4C:   9.11 cm LV major s, A4C:   8.62 cm LV vol d, MOD A4C: 148.0 ml LV vol s, MOD A4C: 99.4 ml LV SV MOD  A4C:     148.0 ml RIGHT VENTRICLE TAPSE (M-mode): 1.8 cm LEFT ATRIUM             Index       RIGHT ATRIUM           Index LA Vol (A2C):   61.8 ml 22.55 ml/m RA Area:     19.40 cm LA Vol (A4C):   71.3 ml 26.02 ml/m RA Volume:   46.80 ml  17.08 ml/m LA Biplane Vol: 68.0 ml 24.81 ml/m  AORTIC VALVE AV Area (Vmax):    1.27 cm AV Area (Vmean):   1.33 cm AV Area (VTI):     1.29 cm AV Vmax:           275.33 cm/s AV Vmean:          194.667 cm/s AV VTI:            0.543 m AV Peak Grad:      30.3 mmHg AV Mean Grad:      17.7 mmHg LVOT Vmax:         101.00 cm/s LVOT Vmean:        74.700  cm/s LVOT VTI:          0.202 m LVOT/AV VTI ratio: 0.37  SHUNTS Systemic VTI:  0.20 m Systemic Diam: 2.10 cm  Lennie Odor MD Electronically signed by Lennie Odor MD Signature Date/Time: 07/07/2019/9:36:08 AM    Final    ECHOCARDIOGRAM LIMITED  Result Date: 07/06/2019   ECHOCARDIOGRAM LIMITED REPORT   Patient Name:   Daniel Valenzuela Date of Exam: 07/06/2019 Medical Rec #:  [id number]      Height:       71.0 in Accession #:    [id number]     Weight:       372.3 lb Date of Birth:  Oct 03, 1950      BSA:          2.75 m Patient Age:    68 years       BP:           129/75 mmHg Patient Gender: M              HR:           82 bpm. Exam Location:  Inpatient  Procedure: Limited Echo, Cardiac Doppler and Color Doppler Indications:     I35.0 Nonrheumatic aortic (valve) stenosis  History:         Patient has prior history of Echocardiogram examinations, most                  recent 07/05/2019. Severe aortic stenosis.  Sonographer:     Sheralyn Boatman RDCS Referring Phys:  [id number] Dear [facility] Diagnosing Phys: Charlton Haws MD  Sonographer Comments: Suboptimal parasternal window, suboptimal apical window, Technically difficult study due to poor echo windows and Technically challenging study due to limited acoustic windows. Image acquisition challenging due to patient body habitus. TAVR Procedure. IMPRESSIONS  1. Left ventricular ejection fraction, by visual estimation, is 30 to 35%. The left ventricle has moderate to severely decreased function. There is mildly increased left ventricular hypertrophy.  2. Global right ventricle was not well visualized.The right ventricular size is not well visualized. Right vetricular wall thickness was not assessed.  3. Left atrial size was not well visualized.  4. Right atrial size was not well visualized.  5. Moderate calcification of the mitral valve leaflet(s).  6. Moderate thickening of the mitral valve leaflet(s).  7. Severe mitral annular calcification.  8. The mitral valve is normal in  structure. Mild mitral valve regurgitation.  9. The tricuspid valve is not well visualized. Tricuspid valve regurgitation is mild. 10. Aortic valve regurgitation is not visualized. 11. Very poor images due to obesity with patient supine in OR. 29 mm Sapien 3 valve placed with no obvious PVL and poor CW signals but no high gradietns noted. 12. The pulmonic valve was not well visualized. Pulmonic valve regurgitation is not visualized. 13. The aortic root was not well visualized. 14. The interatrial septum was not well visualized. FINDINGS  Left Ventricle: Left ventricular ejection fraction, by visual estimation, is 30 to 35%. The left ventricle has moderate to severely decreased function. The left ventricle is not well visualized. There is mildly increased left ventricular hypertrophy. Right Ventricle: The right ventricular size is not well visualized. Right vetricular wall thickness was not assessed. Global RV systolic function is was not well visualized. Left Atrium: Left atrial size was not well visualized. Right Atrium: Right atrial size was not well visualized Pericardium: There is no evidence of pericardial effusion. Mitral Valve: The mitral valve is normal in structure. There is moderate thickening of the mitral valve leaflet(s). There is moderate calcification of the mitral valve leaflet(s). Severe mitral annular calcification. Mild mitral valve regurgitation. Tricuspid Valve: The tricuspid valve is not well visualized. Tricuspid valve regurgitation is mild. Aortic Valve: The aortic valve has been repaired/replaced. Aortic valve regurgitation is not visualized. Aortic valve mean gradient measures 1.0 mmHg. Aortic valve peak gradient measures 2.1 mmHg. Very poor images due to obesity with patient supine in OR. 29 mm Sapien 3 valve placed with no obvious PVL and poor CW signals but no high gradietns noted. Pulmonic Valve: The pulmonic valve was not well visualized. Pulmonic valve regurgitation is not visualized.  Aorta: The aortic root was not well visualized. Shunts: The interatrial septum was not well visualized.  LEFT VENTRICLE          Normals PLAX 2D LVOT diam:     2.00 cm  2.0 cm LVOT Area:     3.14 cm 3.14 cm2  AORTIC VALVE              Normals AV Vmax:      72.00 cm/s AV Vmean:     49.000 cm/s 77 cm/s AV VTI:       0.186 m     3.15 cm2 AV Peak Grad: 2.1 mmHg AV Mean Grad: 1.0 mmHg    3 mmHg  SHUNTS Systemic Diam: 2.00 cm  Charlton Haws MD Electronically signed by Charlton Haws MD Signature Date/Time: 07/06/2019/3:00:14 PM    Final    Structural Heart Procedure  Result Date: 07/06/2019 See surgical note for result.   Cardiac Studies     Patient Profile     68 y.o. male with severe AS, NICM who is now s/p TAVR  Assessment & Plan    1. Severe AS: s/p TAVR on 07/06/19. Groins stable. Echo with normally functioning AVR. No PVL. Continue ASA. Resume Plavix when ok with EP team post pacemaker.   2. Complete heart block: permanent pacemaker placed yesterday. Pacemaker evaluation this am completed and functioning normally per pacer rep. Dr. Ladona Ridgel to see this morning.   Probable discharge later today.   For questions or updates, please contact [facility] Please consult [email or web address] for contact info under        Signed, Verne Carrow, MD  07/08/2019, 7:16 AM

## 2019-07-08 NOTE — Progress Notes (Addendum)
Progress Note  Patient Name: Daniel Valenzuela Date of Encounter: 07/08/2019  Primary Cardiologist: Lewayne Bunting, MD   Subjective   Feels well, no CP or SOB, no pain at implant site  Inpatient Medications    Scheduled Meds:  aspirin  81 mg Oral Daily   atorvastatin  40 mg Oral Daily   buPROPion  300 mg Oral Daily   Chlorhexidine Gluconate Cloth  6 each Topical Q0600   escitalopram  20 mg Oral Daily   furosemide  60 mg Oral Daily   gabapentin  600 mg Oral Daily   And   gabapentin  1,200 mg Oral QHS   insulin aspart  0-24 Units Subcutaneous TID AC & HS   mupirocin ointment  1 application Nasal BID   pantoprazole  40 mg Oral Daily   potassium chloride SA  20 mEq Oral Daily   silver sulfADIAZINE   Topical BID   sodium chloride flush  3 mL Intravenous Q12H   sodium chloride flush  3 mL Intravenous Q12H   tamsulosin  0.4 mg Oral QPC supper   Continuous Infusions:  sodium chloride     nitroGLYCERIN Stopped (07/06/19 1735)   phenylephrine (NEO-SYNEPHRINE) Adult infusion Stopped (07/06/19 1735)   vancomycin     PRN Meds: sodium chloride, acetaminophen **OR** acetaminophen, acetaminophen, morphine injection, ondansetron (ZOFRAN) IV, sodium chloride flush, traMADol   Vital Signs    Vitals:   07/08/19 0700 07/08/19 0800 07/08/19 0821 07/08/19 0900  BP: (!) 128/57 (!) 131/107  134/69  Pulse: 77 79  76  Resp: 16 14  15   Temp:   98.9 F (37.2 C)   TempSrc:   Oral   SpO2: 99% 98%  98%  Weight:      Height:        Intake/Output Summary (Last 24 hours) at 07/08/2019 1119 Last data filed at 07/08/2019 0900 Gross per 24 hour  Intake 1430.25 ml  Output 0 ml  Net 1430.25 ml   Last 3 Weights 07/08/2019 07/07/2019 07/06/2019  Weight (lbs) 377 lb 10.4 oz 370 lb 9.5 oz 372 lb 5 oz  Weight (kg) 171.3 kg 168.1 kg 168.88 kg      Telemetry    SR/V pacing - Personally Reviewed  ECG     SR, V pacing- Personally Reviewed  Physical Exam   GEN: No acute distress.   Neck: No  JVD Cardiac: RRR, no murmurs, rubs, or gallops.  Respiratory: CTA b/l. GI: Soft, nontender, non-distended, obese MS: trace edema; No deformity. Neuro:  Nonfocal  Psych: Normal affect   L chest implant site: clean and dry, no hematoma, no ecchymosis  Labs    High Sensitivity Troponin:  No results for input(s): TROPONINIHS in the last 720 hours.    Chemistry Recent Labs  Lab 07/02/19 0946 07/06/19 1247 07/07/19 0224 07/08/19 0600  NA 138 140 140 139  K 4.1 4.0 3.8 3.6  CL 101 108 101 102  CO2 23  --  27 28  GLUCOSE 211* 92 161* 193*  BUN 11 14 9  7*  CREATININE 0.96 0.50* 0.82 0.70  CALCIUM 8.7*  --  8.7* 8.3*  PROT 7.1  --   --   --   ALBUMIN 3.2*  --   --   --   AST 24  --   --   --   ALT 22  --   --   --   ALKPHOS 99  --   --   --  BILITOT 1.1  --   --   --   GFRNONAA >60  --  >60 >60  GFRAA >60  --  >60 >60  ANIONGAP 14  --  12 9     Hematology Recent Labs  Lab 07/02/19 0946 07/06/19 1247 07/07/19 0224 07/08/19 0600  WBC 9.1  --  9.2 7.3  RBC 5.05  --  4.53 4.21*  HGB 14.5 11.2* 12.8* 12.0*  HCT 45.4 33.0* 40.6 38.1*  MCV 89.9  --  89.6 90.5  MCH 28.7  --  28.3 28.5  MCHC 31.9  --  31.5 31.5  RDW 15.0  --  14.9 15.1  PLT 276  --  176 148*    BNP Recent Labs  Lab 07/02/19 0946  BNP 249.8*     DDimer No results for input(s): DDIMER in the last 168 hours.   Radiology    Dg Chest Port 1 View Result Date: 07/06/2019 CLINICAL DATA:  Follow-up TAVR EXAM: PORTABLE CHEST 1 VIEW COMPARISON:  07/02/2019 FINDINGS: Mild cardiomegaly. TAVR inapparent due to under penetration of the mediastinum. Mild pulmonary venous hypertension without frank edema. No infiltrate, collapse or effusion. Loop recorder in place. IMPRESSION: Mild cardiomegaly. Mild pulmonary venous hypertension. No frank edema. No consolidation or collapse. Electronically Signed   By: Paulina FusiMark  Shogry M.D.   On: 07/06/2019 12:43    Cardiac Studies    07/07/2019 TTE IMPRESSIONS  1. 29 mm S3  TAVR. Vmax 2.8 m/s. Mean gradient 17 mmHG. EOA 1.29 cm2. Normal functioning prosthetic valve without paravalvular leak detected.  2. Left ventricular ejection fraction, by visual estimation, is 35 to 40%. The left ventricle has moderately decreased function. There is no left ventricular hypertrophy.  3. Severe hypokinesis of the apex. Global hypokinesis in other segments.  4. Definity contrast agent was given IV to delineate the left ventricular endocardial borders.  5. Abnormal septal motion consistent with RV pacemaker.  6. Left ventricular diastolic function could not be evaluated.  7. Global right ventricle has low normal systolic function.The right ventricular size is normal. No increase in right ventricular wall thickness.  8. Left atrial size was mildly dilated.  9. Right atrial size was normal. 10. Presence of pericardial fat pad. 11. Mild to moderate mitral annular calcification. 12. The mitral valve is degenerative. No evidence of mitral valve regurgitation. No evidence of mitral stenosis. 13. Aortic valve regurgitation is not visualized. 14. The pulmonic valve was not well visualized. Pulmonic valve regurgitation is not visualized. 15. The inferior vena cava is dilated in size with >50% respiratory variability, suggesting right atrial pressure of 8 mmHg. 16. The tricuspid valve is not well visualized. Tricuspid valve regurgitation is not demonstrated. 17. TR signal is inadequate for assessing pulmonary artery systolic pressure. 18. Complete heart block present with RV apical pacing.  FINDINGS  Left Ventricle: Left ventricular ejection fraction, by visual estimation, is 35 to 40%. The left ventricle has moderately decreased function. Definity contrast agent was given IV to delineate the left ventricular endocardial borders. The left ventricle  is not well visualized. There is no left ventricular hypertrophy. Abnormal (paradoxical) septal motion, consistent with RV pacemaker. The left  ventricular diastology could not be evaluated due to nondiagnostic images. Left ventricular diastolic function  could not be evaluated. Severe hypokinesis of the apex. Global hypokinesis in other segments.   Right Ventricle: The right ventricular size is normal. No increase in right ventricular wall thickness. Global RV systolic function is has low normal systolic function.  Left Atrium: Left atrial size was mildly dilated.   Right Atrium: Right atrial size was normal in size   Pericardium: There is no evidence of pericardial effusion. Presence of pericardial fat pad.   Mitral Valve: The mitral valve is degenerative in appearance. Mild to moderate mitral annular calcification. No evidence of mitral valve stenosis by observation. No evidence of mitral valve regurgitation.   Tricuspid Valve: The tricuspid valve is not well visualized. Tricuspid valve regurgitation is not demonstrated.   Aortic Valve: The aortic valve has been repaired/replaced. Aortic valve regurgitation is not visualized. Aortic valve mean gradient measures 17.7 mmHg. Aortic valve peak gradient measures 30.3 mmHg. Aortic valve area, by VTI measures 1.29 cm. 74mm  Edwards Sapien bioprosthetic, stented aortic valve (TAVR) valve is present in the aortic position. Procedure Date: 07/06/2019. 29 mm S3 TAVR. Vmax 2.8 m/s. Mean gradient 17 mmHG. EOA 1.29 cm2. Normal functioning prosthetic valve without paravalvular leak  detected.   Pulmonic Valve: The pulmonic valve was not well visualized. Pulmonic valve regurgitation is not visualized.   Aorta: The aortic root is normal in size and structure.   Venous: The inferior vena cava is dilated in size with greater than 50% respiratory variability, suggesting right atrial pressure of 8 mmHg.   IAS/Shunts: No atrial level shunt detected by color flow Doppler.     LEFT VENTRICLE PLAX 2D LVOT diam:     2.10 cm LVOT Area:     3.46 cm   LV Volumes (MOD) LV area d, A4C:    41.30 cm  LV area s, A4C:    32.30 cm LV major d, A4C:   9.11 cm LV major s, A4C:   8.62 cm LV vol d, MOD A4C: 148.0 ml LV vol s, MOD A4C: 99.4 ml LV SV MOD A4C:     148.0 ml   RIGHT VENTRICLE TAPSE (M-mode): 1.8 cm   LEFT ATRIUM             Index       RIGHT ATRIUM           Index LA Vol (A2C):   61.8 ml 22.55 ml/m RA Area:     19.40 cm LA Vol (A4C):   71.3 ml 26.02 ml/m RA Volume:   46.80 ml  17.08 ml/m LA Biplane Vol: 68.0 ml 24.81 ml/m  AORTIC VALVE AV Area (Vmax):    1.27 cm AV Area (Vmean):   1.33 cm AV Area (VTI):     1.29 cm AV Vmax:           275.33 cm/s AV Vmean:          194.667 cm/s AV VTI:            0.543 m AV Peak Grad:      30.3 mmHg AV Mean Grad:      17.7 mmHg LVOT Vmax:         101.00 cm/s LVOT Vmean:        74.700 cm/s LVOT VTI:          0.202 m LVOT/AV VTI ratio: 0.37     SHUNTS Systemic VTI:  0.20 m Systemic Diam: 2.10 cm     Eleonore Chiquito MD Electronically signed by Eleonore Chiquito MD Signature Date/Time: 07/07/2019/9:36:08 AM        Patient Profile     68 y.o. male with a hx of Morbid obesity (s/p gastric banding 2009, BMI currently 51.93), HTN, IDDM (II), OSA (not onCPAP), GERD, chronic CHF (combined),  syncope (suspect to be vagal at the time, has loop without arrhythmias or brady), RBBB, and more recently VHD w/AS  Admitted yesterday for his TAVR, inter-op developed CHB  Assessment & Plan    1. VHD w/severe AS,      POD #2     Transcatheter Aortic Valve Replacement - Transfemoral Approach      Edwards Sapien 3 THV (size 29 mm, model # K966601, serial # [id number])      Developed CHB intra-procedure      2. Baseline conduction system disease with IVCD/RBBB and long 1st degree     last dose of home Toprol was 07/05/2019     CHB post TAVR continues     LVEF by TTE this AM 35-40%       S/p CRT-P implant yesterday with Dr. Ladona Ridgel CXR completed and reviewed by Dr. Ladona Ridgel, stable lead placement, no ptx discussed in report Device check this  AM with intact function, stable measurements Wound care and activity restrictions were reviewed with the patient Routine post implant follow up is in place  OK to discharge from EP perspective No plavix for 2 days (d/ Dr. Clifton James)    For questions or updates, please contact [facility] Please consult [email or web address] for contact info under   Signed, Sheilah Pigeon, PA-C  07/08/2019, 11:19 AM    EP Attending  Patient seen and examined. Agree with above. The patient is s/p biv PPM insertion and is doing well. His device has been interrogated and is working normally. His cxr demonstrates normal lead position. His interrogation demonstrates normal biv pm function. Ok to [employer] home with usual followup.  Leonia Reeves.D.

## 2019-07-08 NOTE — Progress Notes (Signed)
CARDIAC REHAB PHASE I   PRE:  Rate/Rhythm: paced 77  BP:  Supine: 117/103  wrist  Sitting:   Standing:    SaO2: 96%RA  MODE:  Ambulation: 120 ft   POST:  Rate/Rhythm: paced 108  BP:  Supine:  Sitting: 186/78 wrist  Standing:    SaO2: 94%RA 1055-1135 Assisted pt to bathroom and then he walked 120 ft on RA with rolling walker. Pt stated he uses walker at home. Slightly SOB but tolerated well for first walk. To recliner after walk. Set up lunch. Gave pt low sodium handouts. Pt stated he watches his salt and weighs daily. Reviewed to call MD if 3 pounds overnight or 5 in a week. Encouraged walking as tolerated with walker. BP elevated and pt RN aware.   Graylon Good, RN BSN  07/08/2019 11:31 AM

## 2019-07-09 ENCOUNTER — Telehealth: Payer: Self-pay | Admitting: Physician Assistant

## 2019-07-09 MED FILL — Heparin Sodium (Porcine) Inj 1000 Unit/ML: INTRAMUSCULAR | Qty: 30 | Status: AC

## 2019-07-09 MED FILL — Magnesium Sulfate Inj 50%: INTRAMUSCULAR | Qty: 2 | Status: AC

## 2019-07-09 MED FILL — Potassium Chloride Inj 2 mEq/ML: INTRAVENOUS | Qty: 40 | Status: AC

## 2019-07-09 NOTE — Telephone Encounter (Signed)
  [facility] VALVE TEAM   Patient contacted regarding discharge from [facility] on 07/08/19  Patient understands to follow up with provider Nell Range on 12/17 at [place].  Patient understands discharge instructions? yes Patient understands medications and regimen? yes Patient understands to bring all medications to this visit? yes  Angelena Form PA-C  [facility]

## 2019-07-12 ENCOUNTER — Telehealth: Payer: Self-pay | Admitting: Physician Assistant

## 2019-07-12 NOTE — Telephone Encounter (Signed)
The patient walks with a walker and needs help ambulating.  Informed her she may come up to the visit. She was grateful for assistance.

## 2019-07-12 NOTE — Telephone Encounter (Signed)
New message   Spouse requesting to join patient during 12/17 appointment for support

## 2019-07-14 ENCOUNTER — Ambulatory Visit: Payer: Medicare Other | Admitting: Physician Assistant

## 2019-07-14 DIAGNOSIS — L97511 Non-pressure chronic ulcer of other part of right foot limited to breakdown of skin: Secondary | ICD-10-CM | POA: Diagnosis not present

## 2019-07-15 ENCOUNTER — Other Ambulatory Visit: Payer: Self-pay

## 2019-07-15 ENCOUNTER — Ambulatory Visit (INDEPENDENT_AMBULATORY_CARE_PROVIDER_SITE_OTHER): Payer: Medicare Other | Admitting: Physician Assistant

## 2019-07-15 ENCOUNTER — Other Ambulatory Visit: Payer: Self-pay | Admitting: Physician Assistant

## 2019-07-15 ENCOUNTER — Ambulatory Visit (INDEPENDENT_AMBULATORY_CARE_PROVIDER_SITE_OTHER): Payer: Medicare Other | Admitting: *Deleted

## 2019-07-15 ENCOUNTER — Encounter: Payer: Self-pay | Admitting: Physician Assistant

## 2019-07-15 VITALS — BP 122/64 | HR 69 | Ht 71.0 in | Wt 375.0 lb

## 2019-07-15 DIAGNOSIS — Z950 Presence of cardiac pacemaker: Secondary | ICD-10-CM

## 2019-07-15 DIAGNOSIS — R918 Other nonspecific abnormal finding of lung field: Secondary | ICD-10-CM

## 2019-07-15 DIAGNOSIS — I5041 Acute combined systolic (congestive) and diastolic (congestive) heart failure: Secondary | ICD-10-CM

## 2019-07-15 DIAGNOSIS — R55 Syncope and collapse: Secondary | ICD-10-CM

## 2019-07-15 DIAGNOSIS — N289 Disorder of kidney and ureter, unspecified: Secondary | ICD-10-CM

## 2019-07-15 DIAGNOSIS — Z952 Presence of prosthetic heart valve: Secondary | ICD-10-CM | POA: Diagnosis not present

## 2019-07-15 DIAGNOSIS — I5042 Chronic combined systolic (congestive) and diastolic (congestive) heart failure: Secondary | ICD-10-CM

## 2019-07-15 MED ORDER — TAMSULOSIN HCL 0.4 MG PO CAPS: 0.4000 mg | ORAL_CAPSULE | Freq: Every day | ORAL | 0 refills | Status: AC

## 2019-07-15 MED ORDER — CLINDAMYCIN HCL 300 MG PO CAPS: 600.0000 mg | ORAL_CAPSULE | ORAL | 12 refills | Status: AC

## 2019-07-15 MED ORDER — LOSARTAN POTASSIUM 25 MG PO TABS: 25.0000 mg | ORAL_TABLET | Freq: Every day | ORAL | 3 refills | Status: DC

## 2019-07-15 NOTE — Progress Notes (Signed)
[facility]   MULTIDISCIPLINARY HEART VALVE CLINIC                                       Cardiology Office Note    Date:  07/15/2019   ID:  Daniel Valenzuela, DOB 1951-03-06, MRN [id number]  PCP:  Daniel Perches, MD  Cardiologist: Daniel Bunting, MD / Daniel Valenzuela & Daniel Valenzuela (TAVR)  CC: [facility] s/p TAVR  History of Present Illness:  Daniel Valenzuela is a 68 y.o. male with a history of HTN, morbid obesitys/pgastric banding in 2009(BMI 50), IDDMT2, OSA not on CPAP, conduction disease (RBBB, 1st deg AV block), syncope s/p loop recorder, GERD,chronic combined S/D CHFwith recent admission for [facility] severeaortic stenosiss/p TAVR (07/06/19) who presents to clinic for follow up.   Patient's cardiac history dates back to February 2019 when he was admitted following a syncopal episode. Etiology was eventually attributed to vasovagal symptomsand have not recurred since verapamil was discontinued, but the patient underwent loop recorder implantation because of underlying right bundle branch block with long first-degree AV block. Echocardiogram at that time revealed normal left ventricular systolic function with mild aortic stenosis. He has been followed intermittently ever since by Dr. Ladona Ridgel and has never had any evidence of syncope or significant advanced conduction system disease based upon loop recorder.  Patient was hospitalized in January 2020 with acute exacerbation of chronic congestive heart failure and massive volume overload. Echocardiogram performed at that time revealed significant left ventricular systolic dysfunction with ejection fraction estimated only 35 to 40%. There was felt to be moderate to severe aortic stenosis with mean transvalvular gradient estimated 28 mmHg and aortic valve area calculated 1.34 cm. The patient underwent diagnostic cardiac catheterization which revealed mild to moderate nonobstructive coronary artery disease with moderate aortic stenosis based on  mean transvalvular gradient measured 23 mmHg at catheterization and aortic valve area calculated 1.4 cm. The patient was recently readmitted to the hospital in early November with another acute exacerbation of chronic congestive heart failure and pulmonary edema. Follow-up echocardiogram revealed left ventricular ejection fraction down further to 30-35%.Despite this there was evidence of severe aortic stenosis with mean transvalvular gradient estimated 35 mmHg, DVI reported 0.20 and aortic valve area calculated only 0.83 cm.  The patient was evalutated by the multidisciplinary valve team and underwent successful TAVR with a57mm Edwards Sapien 3 THV via the TF approach on 07/06/19. After TAVR he developed CHB and underwent successful implantation of a St. Jude BiV pacemaker and loop removal by Dr. Ladona Ridgel. However, apparently loop recorder was not able to be removed. Post operative echo showed EF 35-40%, normally functioning TAVR with mean gradient of 17 mm Hg and no PVL. He was discharged on aspirin and plavix (plavix held for 2 days after PPM placement).  Today he presents to clinic for follow up. He is doing well. Here with his wife today. He is feeling much better in terms of his breathing and ability to manage his fluid.  No lower extremity edema, orthopnea or PND.  He notices that he can go longer without giving out.  He does have a diabetic ulcer on his foot that is being carefully watched by his podiatrist.  Wound culture was sent to see if antibiotics are necessary.  No dizziness or syncope.  No chest pain no blood in his stool or urine.   Past Medical History:  Diagnosis Date  Aortic stenosis, moderate 07/2018   felt to be moderate at cath Jan 2020   CAD (coronary artery disease) 07/2018   mild non obstructive   Chronic back pain    "mainly lower right now" (08/12/2018)   Chronic combined systolic and diastolic heart failure (HCC)    Depression    Diabetic neuropathy (HCC)     First degree AV block    GERD (gastroesophageal reflux disease)    High cholesterol    Hypertension    Morbid obesity with body mass index of 50 or higher (HCC)    NICM (nonischemic cardiomyopathy) (HCC) 07/2018   EF 35-40%   Pulmonary nodules    multiple small pulmonary nodules in right lung noted on pre tavr CT. non contrast CT follow up in 3-6 monhts   RBBB    Renal lesion    right renal lesion --> MRI recommended   S/P TAVR (transcatheter aortic valve replacement)    a. s/p TAVR with a 29 mm Edwards Sapien 3 THV via the TF apprach on 07/06/19 wtih Dr. Aundra DubinMcAlhnay and Dr Lavinia SharpsBartel   Severe aortic stenosis    s/p tavr   Sleep apnea    "lost weight; not on CPAP anymore" (08/12/2018)   Spinal stenosis of lumbar region    "w/slipped disc" (08/12/2018)   Syncope    Felt to be secondary to autonomic dysfunction-s/p Loop recorder Feb 2019   Type II diabetes mellitus (HCC)     Past Surgical History:  Procedure Laterality Date   HERNIA REPAIR     LAPAROSCOPIC GASTRIC BANDING     LOOP RECORDER INSERTION N/A 09/01/2017   Procedure: LOOP RECORDER INSERTION;  Surgeon: Duke SalviaKlein, Steven C, MD;  [facility];  Service: Cardiovascular;  Laterality: N/A;   LOOP RECORDER REMOVAL N/A 07/07/2019   Procedure: LOOP RECORDER REMOVAL;  Surgeon: Marinus Mawaylor, Gregg W, MD;  Location: [facility] INVASIVE CV LAB;  Service: Cardiovascular;  Laterality: N/A;   PACEMAKER IMPLANT N/A 07/07/2019   Procedure: PACEMAKER IMPLANT;  Surgeon: Marinus Mawaylor, Gregg W, MD;  Location: [facility] INVASIVE CV LAB;  Service: Cardiovascular;  Laterality: N/A;   RIGHT/LEFT HEART CATH AND CORONARY ANGIOGRAPHY N/A 08/14/2018   Procedure: RIGHT/LEFT HEART CATH AND CORONARY ANGIOGRAPHY;  Surgeon: Yvonne KendallEnd, Christopher, MD;  Location: [facility] INVASIVE CV LAB;  Service: Cardiovascular;  Laterality: N/A;   SHOULDER ARTHROSCOPY Right    TEE WITHOUT CARDIOVERSION N/A 07/06/2019   Procedure: TRANSESOPHAGEAL ECHOCARDIOGRAM (TEE);  Surgeon: Kathleene HazelMcAlhany,  Christopher D, MD;  [facility];  Service: Open Heart Surgery;  Laterality: N/A;   TONSILLECTOMY     TRANSCATHETER AORTIC VALVE REPLACEMENT, TRANSFEMORAL N/A 07/06/2019   Procedure: TRANSCATHETER AORTIC VALVE REPLACEMENT, TRANSFEMORAL;  Surgeon: Kathleene HazelMcAlhany, Christopher D, MD;  Location: [facility] OR;  Service: Open Heart Surgery;  Laterality: N/A;   UMBILICAL HERNIA REPAIR      Current Medications: Outpatient Medications Prior to Visit  Medication Sig Dispense Refill   aspirin 81 MG chewable tablet Chew 1 tablet (81 mg total) by mouth daily.     atorvastatin (LIPITOR) 40 MG tablet Take 40 mg by mouth daily.      buPROPion (WELLBUTRIN XL) 300 MG 24 hr tablet Take 300 mg by mouth daily.      Calcium-Magnesium-Zinc (CAL-MAG-ZINC PO) Take 1 tablet by mouth daily with breakfast.     chlorhexidine (PERIDEX) 0.12 % solution Use as directed 15 mLs in the mouth or throat 2 (two) times daily. 120 mL 6   clopidogrel (PLAVIX) 75 MG tablet Take 1  tablet (75 mg total) by mouth daily. 90 tablet 1   escitalopram (LEXAPRO) 20 MG tablet Take 20 mg by mouth daily.     furosemide (LASIX) 40 MG tablet Take 1.5 tablets (60 mg total) by mouth daily. 45 tablet 0   gabapentin (NEURONTIN) 600 MG tablet Take 600-1,200 mg by mouth See admin instructions. Take 1 tablet (600 mg) by mouth every morning & take 2 tablets (1200 mg) by mouth at bedtime.     HYDROcodone-acetaminophen (NORCO/VICODIN) 5-325 MG tablet Take 1 tablet by mouth 4 (four) times daily as needed (pain.).      insulin NPH Human (NOVOLIN N RELION) 100 UNIT/ML injection Inject 10-40 Units into the skin 2 (two) times daily. *Hold for blood glucose reading less than 140*     insulin regular (NOVOLIN R RELION) 100 units/mL injection Inject 0-20 Units into the skin See admin instructions. Sliding Scale Insulin two to three times a day before meals     metoprolol succinate (TOPROL-XL) 25 MG 24 hr tablet Take 25 mg by mouth daily.     Multiple Vitamin  (MULTIVITAMIN WITH MINERALS) TABS tablet Take 1 tablet by mouth daily. One-A-Day Multivitamin     pantoprazole (PROTONIX) 40 MG tablet Take 40 mg by mouth daily.      silver sulfADIAZINE (SILVADENE) 1 % cream APPLY TO AFFECTED AREA BID     vitamin B-12 (CYANOCOBALAMIN) 1000 MCG tablet Take 1,000 mcg by mouth daily.     tamsulosin (FLOMAX) 0.4 MG CAPS capsule Take 1 capsule (0.4 mg total) by mouth daily after supper. 30 capsule 0   phenazopyridine (PYRIDIUM) 100 MG tablet Take 1 tablet (100 mg total) by mouth 3 (three) times daily with meals. (Patient not taking: Reported on 06/18/2019) 10 tablet 0   No facility-administered medications prior to visit.     Allergies:   Methyldopa-hydrochlorothiazide, Motrin [ibuprofen], Nsaids, and Amoxicillin   Social History   Socioeconomic History   Marital status: Married    Spouse name: Not on file   Number of children: Not on file   Years of education: Not on file   Highest education level: Not on file  Occupational History   Not on file  Tobacco Use   Smoking status: Never Smoker   Smokeless tobacco: Never Used  Substance and Sexual Activity   Alcohol use: No   Drug use: Never   Sexual activity: Not Currently  Other Topics Concern   Not on file  Social History Narrative   Not on file   Social Determinants of Health   Financial Resource Strain:    Difficulty of Paying Living Expenses: Not on file  Food Insecurity:    Worried About Running Out of Food in the Last Year: Not on file   Ran Out of Food in the Last Year: Not on file  Transportation Needs:    Lack of Transportation (Medical): Not on file   Lack of Transportation (Non-Medical): Not on file  Physical Activity:    Days of Exercise per Week: Not on file   Minutes of Exercise per Session: Not on file  Stress:    Feeling of Stress : Not on file  Social Connections:    Frequency of Communication with Friends and Family: Not on file   Frequency of  Social Gatherings with Friends and Family: Not on file   Attends Religious Services: Not on file   Active Member of Clubs or Organizations: Not on file   Attends Banker Meetings: Not on  file   Marital Status: Not on file     Family History:  The patient's family history includes Cancer in his mother; Diabetes in his mother; Heart failure in his father; Parkinson's disease in his father.     ROS:   Please see the history of present illness.    ROS All other systems reviewed and are negative.   PHYSICAL EXAM:   VS:  BP 122/64    Pulse 69    Ht 5\' 11"  (1.803 m)    Wt (!) 375 lb (170.1 kg)    SpO2 96%    BMI 52.30 kg/m    GEN: Well nourished, well developed, in no acute distress, morbidly obese  HEENT: normal Neck: no JVD or masses Cardiac: RRR; no murmurs, rubs, or gallops, trace lower extremity edema bilaterally. Respiratory:  clear to auscultation bilaterally, normal work of breathing GI: soft, nontender, nondistended, + BS MS: no deformity or atrophy Skin: warm and dry, no rash Neuro:  Alert and Oriented x 3, Strength and sensation are intact Psych: euthymic mood, full affect   Wt Readings from Last 3 Encounters:  07/15/19 (!) 375 lb (170.1 kg)  07/08/19 (!) 377 lb 10.4 oz (171.3 kg)  07/02/19 (!) 372 lb 5 oz (168.9 kg)      Studies/Labs Reviewed:   EKG:  EKG is NOT ordered today.    Recent Labs: 07/02/2019: ALT 22; B Natriuretic Peptide 249.8 07/08/2019: BUN 7; Creatinine, Ser 0.70; Hemoglobin 12.0; Magnesium 1.9; Platelets 148; Potassium 3.6; Sodium 139   Lipid Panel    Component Value Date/Time   CHOL 78 08/16/2018 0431   TRIG 53 08/16/2018 0431   HDL 35 (L) 08/16/2018 0431   CHOLHDL 2.2 08/16/2018 0431   VLDL 11 08/16/2018 0431   LDLCALC 32 08/16/2018 0431    Additional studies/ records that were reviewed today include:  TAVR OPERATIVE NOTE   Date of Procedure:07/06/2019  Preoperative Diagnosis:Severe Aortic  Stenosis   Postoperative Diagnosis:Same   Procedure:   Transcatheter Aortic Valve Replacement - PercutaneousRightTransfemoral Approach Edwards Sapien 3 THV (size 69mm, model # 9600TFX, serial #[id number])  Co-Surgeons:Bryan Alveria Apley, MD and Lauree Chandler, MD   Anesthesiologist:Kevin Conrad Tripoli, MD.  Echocardiographer:Peter Johnsie Cancel, MD.  Pre-operative Echo Findings: ? Severe aortic stenosis ? Moderateleft ventricular systolic function  Post-operative Echo Findings: ? Noparavalvular leak ? Moderateleft ventricular systolic function  _______________  Echo12/9/20: IMPRESSIONS 1. 29 mm S3 TAVR. Vmax 2.8 m/s. Mean gradient 17 mmHG. EOA 1.29 cm2. Normal functioning prosthetic valve without paravalvular leak detected. 2. Left ventricular ejection fraction, by visual estimation, is 35 to 40%. The left ventricle has moderately decreased function. There is no left ventricular hypertrophy. 3. Severe hypokinesis of the apex. Global hypokinesis in other segments. 4. Definity contrast agent was given IV to delineate the left ventricular endocardial borders. 5. Abnormal septal motion consistent with RV pacemaker. 6. Left ventricular diastolic function could not be evaluated. 7. Global right ventricle has low normal systolic function.The right ventricular size is normal. No increase in right ventricular wall thickness. 8. Left atrial size was mildly dilated. 9. Right atrial size was normal. 10. Presence of pericardial fat pad. 11. Mild to moderate mitral annular calcification. 12. The mitral valve is degenerative. No evidence of mitral valve regurgitation. No evidence of mitral stenosis. 13. Aortic valve regurgitation is not visualized. 14. The pulmonic valve was not well visualized. Pulmonic valve regurgitation is not visualized. 15. The inferior vena cava is dilated in size  with >50% respiratory variability, suggesting right  atrial pressure of 8 mmHg. 16. The tricuspid valve is not well visualized. Tricuspid valve regurgitation is not demonstrated. 17. TR signal is inadequate for assessing pulmonary artery systolic pressure. 18. Complete heart block present with RV apical pacing.  _____________  07/07/19 LOOP RECORDER REMOVAL  PACEMAKER IMPLANT   CONCLUSIONS:  1. Successful implantation of a St. Jude BiV pacemaker for symptomatic complete AV block after TAVR  2. No early apparent complications.   **Loop recorder was not able to be removed      ASSESSMENT & PLAN:   Severe AS s/p TAVR:Doing well.  He can tell a big difference in how he feels since having TAVR.  Continue on aspirin and Plavix. SBE prophylaxis discussed; I have RX'd clindamycin due to a PCN allergy.  He has his 1 month follow-up echo scheduled at [facility] on 12/29.  I will do a virtual visit with him after that since he lives out in [place].  CHB s/p PPM: Wound check today after this appointment.  Chroniccombined S/DCHF: Appears euvolemic.  Continue current dosing of Lasix.  NICM: EF 35 to 40%.  Continue Toprol-XL 25 mg daily and add losartan 25 mg daily.  We will check a BMP today and again and 1.5 weeks when he comes back for his echocardiogram at [facility].  Incidental findings: pre TAVR CT showed   multiple small pulmonary nodules in the right lung--> Non-contrast chest CT at 3-6 months is recommended.   2.1 cm intermediate attenuation lesion in the right kidney, incompletely characterized--> nonemergent abdominal MRI with and without IV gadolinium is recommended in the near future. I have discussed both of these findings with the patient.  We will schedule the CT at his 1 month appointment.  I am still making sure he is clear for MRI given his loop recorder and pacemaker.   Medication Adjustments/Labs and Tests Ordered: Current medicines are reviewed at length with  the patient today.  Concerns regarding medicines are outlined above.  Medication changes, Labs and Tests ordered today are listed in the Patient Instructions below. Patient Instructions  Medication Instructions:  1) START Losartan 25mg  once daily 2) We have prescribed clindamycin to take before dental work and cleanings.    *If you need a refill on your cardiac medications before your next appointment, please call your pharmacy*  Lab Work: BMET today.    You will need to take the paperwork provided to you to the [facility] in [place] on the same day that you have your echocardiogram.  If you have labs (blood work) drawn today and your tests are completely normal, you will receive your results only by:  MyChart Message (if you have MyChart) OR  A paper copy in the mail If you have any lab test that is abnormal or we need to change your treatment, we will call you to review the results.  Testing/Procedures: Keep current appointment for echocardiogram.   Follow-Up: Keep current follow up with Carlean Jews, PA-C.  Other Instructions       Signed, Cline Crock, PA-C  07/15/2019 3:02 PM    [facility] [address] [place], [place], [place]  [address] Phone: [phone]; Fax: [phone]

## 2019-07-15 NOTE — Patient Instructions (Addendum)
Medication Instructions:  1) START Losartan 25mg  once daily 2) We have prescribed clindamycin to take before dental work and cleanings.    *If you need a refill on your cardiac medications before your next appointment, please call your pharmacy*  Lab Work: BMET today.    You will need to take the paperwork provided to you to the [facility] in [place] on the same day that you have your echocardiogram.  If you have labs (blood work) drawn today and your tests are completely normal, you will receive your results only by: Marland Kitchen MyChart Message (if you have MyChart) OR . A paper copy in the mail If you have any lab test that is abnormal or we need to change your treatment, we will call you to review the results.  Testing/Procedures: Keep current appointment for echocardiogram.   Follow-Up: Keep current follow up with Nell Range, PA-C.  Other Instructions

## 2019-07-15 NOTE — Patient Instructions (Signed)
Call the office if you have any swelling, redness,bleeding, or drainage from the incision site.

## 2019-07-16 LAB — BASIC METABOLIC PANEL
BUN/Creatinine Ratio: 12 (ref 10–24)
BUN: 8 mg/dL (ref 8–27)
CO2: 29 mmol/L (ref 20–29)
Calcium: 9 mg/dL (ref 8.6–10.2)
Chloride: 99 mmol/L (ref 96–106)
Creatinine, Ser: 0.67 mg/dL — ABNORMAL LOW (ref 0.76–1.27)
GFR calc Af Amer: 116 mL/min/{1.73_m2} (ref >59)
GFR calc non Af Amer: 100 mL/min/{1.73_m2} (ref >59)
Glucose: 112 mg/dL — ABNORMAL HIGH (ref 65–99)
Potassium: 4.3 mmol/L (ref 3.5–5.2)
Sodium: 143 mmol/L (ref 134–144)

## 2019-07-19 ENCOUNTER — Telehealth: Payer: Self-pay

## 2019-07-19 NOTE — Telephone Encounter (Signed)
Pt wife was returning someone call. I help her send a manual transmission with his home remote monitor. I told her someone will review the transmission and if they see anything she will get a call back. If everything normal no one will call.

## 2019-07-20 LAB — CUP PACEART INCLINIC DEVICE CHECK
Battery Remaining Longevity: 51 mo
Battery Voltage: 3.02 V
Brady Statistic RA Percent Paced: 1.3 %
Brady Statistic RV Percent Paced: 99.64 %
Date Time Interrogation Session: 20201217163400
Implantable Lead Implant Date: 20201209
Implantable Lead Implant Date: 20201209
Implantable Lead Location: 753859
Implantable Lead Location: 753860
Implantable Pulse Generator Implant Date: 20201209
Lead Channel Impedance Value: 550 Ohm
Lead Channel Impedance Value: 575 Ohm
Lead Channel Impedance Value: 800 Ohm
Lead Channel Pacing Threshold Amplitude: 0.75 V
Lead Channel Pacing Threshold Amplitude: 1 V
Lead Channel Pacing Threshold Amplitude: 1.25 V
Lead Channel Pacing Threshold Pulse Width: 0.5 ms
Lead Channel Pacing Threshold Pulse Width: 0.5 ms
Lead Channel Pacing Threshold Pulse Width: 0.5 ms
Lead Channel Sensing Intrinsic Amplitude: 5 mV
Lead Channel Setting Pacing Amplitude: 3.5 V
Lead Channel Setting Pacing Amplitude: 3.5 V
Lead Channel Setting Pacing Amplitude: 5 V
Lead Channel Setting Pacing Pulse Width: 0.5 ms
Lead Channel Setting Pacing Pulse Width: 0.5 ms
Lead Channel Setting Sensing Sensitivity: 4 mV
Pulse Gen Serial Number: 9165562

## 2019-07-20 NOTE — Progress Notes (Signed)
Wound check appointment. Steri-strips removed. Wound without redness or edema. Incision edges approximated, wound well healed. Normal device function. Thresholds, sensing, and impedances consistent with implant measurements. Device programmed at 3.5V/auto capture programmed on for extra safety margin until 3 month visit. Histogram distribution appropriate for patient and level of activity. No mode switches or high ventricular rates noted. Patient educated about wound care, arm mobility, lifting restrictions. Next remote transmission 10/14/19. ROV with Dr Lovena Le 10/06/19.

## 2019-07-26 ENCOUNTER — Other Ambulatory Visit (HOSPITAL_COMMUNITY): Payer: Self-pay | Admitting: Internal Medicine

## 2019-07-26 DIAGNOSIS — T182XXA Foreign body in stomach, initial encounter: Secondary | ICD-10-CM

## 2019-07-27 ENCOUNTER — Ambulatory Visit (HOSPITAL_COMMUNITY)
Admission: RE | Admit: 2019-07-27 | Discharge: 2019-07-27 | Disposition: A | Discharge status: Home / Self Care | Payer: Medicare Other | Source: Ambulatory Visit | Attending: Internal Medicine | Admitting: Internal Medicine

## 2019-07-27 ENCOUNTER — Other Ambulatory Visit: Payer: Self-pay

## 2019-07-27 ENCOUNTER — Ambulatory Visit (HOSPITAL_COMMUNITY)
Admission: RE | Admit: 2019-07-27 | Discharge: 2019-07-27 | Disposition: A | Discharge status: Home / Self Care | Payer: Medicare Other | Source: Ambulatory Visit | Attending: Physician Assistant | Admitting: Physician Assistant

## 2019-07-27 DIAGNOSIS — T182XXA Foreign body in stomach, initial encounter: Secondary | ICD-10-CM | POA: Diagnosis present

## 2019-07-27 DIAGNOSIS — Z952 Presence of prosthetic heart valve: Secondary | ICD-10-CM

## 2019-07-27 MED ORDER — PERFLUTREN LIPID MICROSPHERE
1.0000 mL | INTRAVENOUS | Status: AC | PRN
  Administered 2019-07-27: 1 mL via INTRAVENOUS
  Administered 2019-07-27: 2 mL via INTRAVENOUS
  Administered 2019-07-27: 1 mL via INTRAVENOUS
  Filled 2019-07-27: qty 10

## 2019-07-27 NOTE — Progress Notes (Signed)
*  PRELIMINARY RESULTS* Echocardiogram 2D Echocardiogram has been performed with Definity.  Samuel Germany 07/27/2019, 3:27 PM

## 2019-07-28 LAB — BASIC METABOLIC PANEL
BUN/Creatinine Ratio: 14 (ref 10–24)
BUN: 12 mg/dL (ref 8–27)
CO2: 29 mmol/L (ref 20–29)
Calcium: 9.2 mg/dL (ref 8.6–10.2)
Chloride: 99 mmol/L (ref 96–106)
Creatinine, Ser: 0.88 mg/dL (ref 0.76–1.27)
GFR calc Af Amer: 103 mL/min/{1.73_m2} (ref >59)
GFR calc non Af Amer: 90 mL/min/{1.73_m2} (ref >59)
Glucose: 186 mg/dL — ABNORMAL HIGH (ref 65–99)
Potassium: 4.7 mmol/L (ref 3.5–5.2)
Sodium: 142 mmol/L (ref 134–144)

## 2019-07-28 NOTE — Progress Notes (Signed)
[facility]   MULTIDISCIPLINARY HEART VALVE TEAM  Virtual Visit via Telephone Note   This visit type was conducted due to national recommendations for restrictions regarding the COVID-19 Pandemic (e.g. social distancing) in an effort to limit this patient's exposure and mitigate transmission in our community.  Due to his co-morbid illnesses, this patient is at least at moderate risk for complications without adequate follow up.  This format is felt to be most appropriate for this patient at this time.  The patient did not have access to video technology/had technical difficulties with video requiring transitioning to audio format only (telephone).  All issues noted in this document were discussed and addressed.  No physical exam could be performed with this format.  Please refer to the patient's chart for his  consent to telehealth for [facility].   Evaluation Performed:  Follow-up visit  Date:  07/29/2019   ID:  Daniel Valenzuela, [name] Jan 03, 1951, MRN [id number]  Patient Location: Home Provider Location: Office  PCP:  Carylon Perches, MD  Cardiologist:  Lewayne Bunting, MD / Dr. Clifton James & Dr. Laneta Simmers (TAVR) Electrophysiologist:  Lewayne Bunting, MD   Chief Complaint:  1 month s/p TAVR  History of Present Illness:    Daniel Valenzuela is a 68 y.o. male with a history of  HTN, morbid obesitys/pgastric banding in 2009(BMI 50), IDDMT2, OSA not on CPAP, conduction disease (RBBB, 1st deg AV block), syncope s/p loop recorder, GERD,chronic combined S/D CHFwith recent admission for [facility] severeaortic stenosiss/p TAVR (07/07/19) c/b CHB s/p PPM (12/9) who presents via a virtual visit for follow up.   The patient does not have symptoms concerning for COVID-19 infection (fever, chills, cough, or new shortness of breath).   Patient's cardiac history dates back to February 2019 when he was admitted following a syncopal episode. Etiology was eventually attributed to vasovagal symptomsand have  not recurred since verapamil was discontinued, but the patient underwent loop recorder implantation because of underlying right bundle branch block with long first-degree AV block. Echocardiogram at that time revealed normal left ventricular systolic function with mild aortic stenosis. He has been followed intermittently ever since by Dr. Ladona Ridgel and has never had any evidence of syncope or significant advanced conduction system disease based upon loop recorder.  Patient was hospitalized in January 2020 with acute exacerbation of chronic congestive heart failure and massive volume overload. Echocardiogram performed at that time revealed significant left ventricular systolic dysfunction with ejection fraction estimated only 35 to 40%. There was felt to be moderate to severe aortic stenosis with mean transvalvular gradient estimated 28 mmHg and aortic valve area calculated 1.34 cm. The patient underwent diagnostic cardiac catheterization which revealed mild to moderate nonobstructive coronary artery disease with moderate aortic stenosis based on mean transvalvular gradient measured 23 mmHg at catheterization and aortic valve area calculated 1.4 cm. The patient was recently readmitted to the hospitalin early Novemberwith another acute exacerbation of chronic congestive heart failure and pulmonary edema. Follow-up echocardiogram revealed left ventricular ejection fraction down further to 30-35%.Despite this there was evidence of severe aortic stenosis with mean transvalvular gradient estimated 35 mmHg, DVI reported 0.20 and aortic valve area calculated only 0.83 cm.  The patient was evalutated by the multidisciplinary valve team and underwent successful TAVR with a18mm Edwards Sapien 3 THV via the TF approach on 07/06/19. After TAVR he developed CHB and underwent successful implantation of a [employer]. Jude BiV pacemakerand loop removal by Dr. Ladona Ridgel. However, apparently loop recorder was not able to be  removed.Post operative echoshowed EF 35-40%, normally functioning TAVR with mean gradient of 17 mm Hg and no PVL. He was discharged on aspirin and plavix (plavix held for 2 days after PPM placement). At follow up he was doing excellent with a big improvement in his breathing. I added Losartan 25 mg daily given cardiomyopathy and follow up BMET was WNL.   Today he presents for follow up. Still battling a foot ulcer which has been closely followed by podiatrist. He has had it debrided and on his second course of ABx. He is trying to keep his weight off it. Other than that he is doing quite well. No CP or SOB. No LE edema. He has occasional orthopnea but no PND. No dizziness or syncope. No blood in stool or urine. No palpitations.     Past Medical History:  Diagnosis Date  . Aortic stenosis, moderate 07/2018   felt to be moderate at cath Jan 2020  . CAD (coronary artery disease) 07/2018   mild non obstructive  . Chronic back pain    "mainly lower right now" (08/12/2018)  . Chronic combined systolic and diastolic heart failure ([place])   . Depression   . Diabetic neuropathy ([place])   . First degree AV block   . GERD (gastroesophageal reflux disease)   . High cholesterol   . Hypertension   . Morbid obesity with body mass index of 50 or higher ([place])   . NICM (nonischemic cardiomyopathy) ([place]) 07/2018   EF 35-40%  . Pulmonary nodules    multiple small pulmonary nodules in right lung noted on pre tavr CT. non contrast CT follow up in 3-6 monhts  . RBBB   . Renal lesion    right renal lesion --> MRI recommended  . S/P TAVR (transcatheter aortic valve replacement)    a. s/p TAVR with a 29 mm Edwards Sapien 3 THV via the TF apprach on 07/06/19 wtih Dr. Buena Irish and Dr Caffie Pinto  . Severe aortic stenosis    s/p tavr  . Sleep apnea    "lost weight; not on CPAP anymore" (08/12/2018)  . Spinal stenosis of lumbar region    "w/slipped disc" (08/12/2018)  . Syncope    Felt to be secondary to autonomic  dysfunction-s/p Loop recorder Feb 2019  . Type II diabetes mellitus ([place])    Past Surgical History:  Procedure Laterality Date  . HERNIA REPAIR    . LAPAROSCOPIC GASTRIC BANDING    . LOOP RECORDER INSERTION N/A 09/01/2017   Procedure: LOOP RECORDER INSERTION;  Surgeon: Deboraha Sprang, MD;  Location: [place] [facility];  Service: Cardiovascular;  Laterality: N/A;  . LOOP RECORDER REMOVAL N/A 07/07/2019   Procedure: LOOP RECORDER REMOVAL;  Surgeon: Evans Lance, MD;  Location: [place] [facility];  Service: Cardiovascular;  Laterality: N/A;  . PACEMAKER IMPLANT N/A 07/07/2019   Procedure: PACEMAKER IMPLANT;  Surgeon: Evans Lance, MD;  Location: [place] [facility];  Service: Cardiovascular;  Laterality: N/A;  . RIGHT/LEFT HEART CATH AND CORONARY ANGIOGRAPHY N/A 08/14/2018   Procedure: RIGHT/LEFT HEART CATH AND CORONARY ANGIOGRAPHY;  Surgeon: Nelva Bush, MD;  Location: [place] [facility];  Service: Cardiovascular;  Laterality: N/A;  . SHOULDER ARTHROSCOPY Right   . TEE WITHOUT CARDIOVERSION N/A 07/06/2019   Procedure: TRANSESOPHAGEAL ECHOCARDIOGRAM (TEE);  Surgeon: Burnell Blanks, MD;  Location: [place];  Service: Open Heart Surgery;  Laterality: N/A;  . TONSILLECTOMY    . TRANSCATHETER AORTIC VALVE REPLACEMENT, TRANSFEMORAL N/A 07/06/2019   Procedure:  TRANSCATHETER AORTIC VALVE REPLACEMENT, TRANSFEMORAL;  Surgeon: Kathleene Hazel, MD;  Location: [facility] OR;  Service: Open Heart Surgery;  Laterality: N/A;  . UMBILICAL HERNIA REPAIR       Current Meds  Medication Sig  . aspirin 81 MG chewable tablet Chew 1 tablet (81 mg total) by mouth daily.  Marland Kitchen atorvastatin (LIPITOR) 40 MG tablet Take 40 mg by mouth daily.   Marland Kitchen buPROPion (WELLBUTRIN XL) 300 MG 24 hr tablet Take 300 mg by mouth daily.   . Calcium-Magnesium-Zinc (CAL-MAG-ZINC PO) Take 1 tablet by mouth daily with breakfast.  . chlorhexidine (PERIDEX) 0.12 % solution Use as directed 15 mLs in the mouth or throat 2 (two) times  daily.  . clindamycin (CLEOCIN) 300 MG capsule Take 2 capsules (600 mg total) by mouth as directed. Take 2 tablets 1 hour prior to dental work including cleanings.  . clopidogrel (PLAVIX) 75 MG tablet Take 1 tablet (75 mg total) by mouth daily.  Marland Kitchen escitalopram (LEXAPRO) 20 MG tablet Take 20 mg by mouth daily.  . furosemide (LASIX) 40 MG tablet Take 1.5 tablets (60 mg total) by mouth daily.  Marland Kitchen gabapentin (NEURONTIN) 600 MG tablet Take 600-1,200 mg by mouth See admin instructions. Take 1 tablet (600 mg) by mouth every morning & take 2 tablets (1200 mg) by mouth at bedtime.  Marland Kitchen HYDROcodone-acetaminophen (NORCO/VICODIN) 5-325 MG tablet Take 1 tablet by mouth 4 (four) times daily as needed (pain.).   Marland Kitchen insulin NPH Human (NOVOLIN N RELION) 100 UNIT/ML injection Inject 10-40 Units into the skin 2 (two) times daily. *Hold for blood glucose reading less than 140*  . insulin regular (NOVOLIN R RELION) 100 units/mL injection Inject 0-20 Units into the skin See admin instructions. Sliding Scale Insulin two to three times a day before meals  . levofloxacin (LEVAQUIN) 500 MG tablet Take 500 mg by mouth daily.  Marland Kitchen losartan (COZAAR) 25 MG tablet Take 1 tablet (25 mg total) by mouth daily.  . metoprolol succinate (TOPROL-XL) 25 MG 24 hr tablet Take 25 mg by mouth daily.  . Multiple Vitamin (MULTIVITAMIN WITH MINERALS) TABS tablet Take 1 tablet by mouth daily. One-A-Day Multivitamin  . pantoprazole (PROTONIX) 40 MG tablet Take 40 mg by mouth daily.   . silver sulfADIAZINE (SILVADENE) 1 % cream APPLY TO AFFECTED AREA BID  . tamsulosin (FLOMAX) 0.4 MG CAPS capsule Take 1 capsule (0.4 mg total) by mouth daily after supper.  . vitamin B-12 (CYANOCOBALAMIN) 1000 MCG tablet Take 1,000 mcg by mouth daily.     Allergies:   Methyldopa-hydrochlorothiazide, Motrin [ibuprofen], Nsaids, and Amoxicillin   Social History   Tobacco Use  . Smoking status: Never Smoker  . Smokeless tobacco: Never Used  Substance Use Topics  .  Alcohol use: No  . Drug use: Never     Family Hx: The patient's family history includes Cancer in his mother; Diabetes in his mother; Heart failure in his father; Parkinson's disease in his father.  ROS:   Please see the history of present illness.    All other systems reviewed and are negative.   Prior CV studies:   The following studies were reviewed today:  TAVR OPERATIVE NOTE   Date of Procedure:07/06/2019  Preoperative Diagnosis:Severe Aortic Stenosis   Postoperative Diagnosis:Same   Procedure:   Transcatheter Aortic Valve Replacement - PercutaneousRightTransfemoral Approach Edwards Sapien 3 THV (size 65mm, model # 9600TFX, serial #[id number])  Co-Surgeons:Bryan Jennefer Bravo, MD and Verne Carrow, MD   Anesthesiologist:Kevin Michelle Piper, MD.  Echocardiographer:Peter Eden Emms, MD.  Pre-operative Echo Findings: ? Severe aortic stenosis ? Moderateleft ventricular systolic function  Post-operative Echo Findings: ? Noparavalvular leak ? Moderateleft ventricular systolic function  _______________  Echo12/9/20: IMPRESSIONS 1. 29 mm S3 TAVR. Vmax 2.8 m/s. Mean gradient 17 mmHG. EOA 1.29 cm2. Normal functioning prosthetic valve without paravalvular leak detected. 2. Left ventricular ejection fraction, by visual estimation, is 35 to 40%. The left ventricle has moderately decreased function. There is no left ventricular hypertrophy. 3. Severe hypokinesis of the apex. Global hypokinesis in other segments. 4. Definity contrast agent was given IV to delineate the left ventricular endocardial borders. 5. Abnormal septal motion consistent with RV pacemaker. 6. Left ventricular diastolic function could not be evaluated. 7. Global right ventricle has low normal systolic function.The right ventricular size is normal. No increase in right  ventricular wall thickness. 8. Left atrial size was mildly dilated. 9. Right atrial size was normal. 10. Presence of pericardial fat pad. 11. Mild to moderate mitral annular calcification. 12. The mitral valve is degenerative. No evidence of mitral valve regurgitation. No evidence of mitral stenosis. 13. Aortic valve regurgitation is not visualized. 14. The pulmonic valve was not well visualized. Pulmonic valve regurgitation is not visualized. 15. The inferior vena cava is dilated in size with >50% respiratory variability, suggesting right atrial pressure of 8 mmHg. 16. The tricuspid valve is not well visualized. Tricuspid valve regurgitation is not demonstrated. 17. TR signal is inadequate for assessing pulmonary artery systolic pressure. 18. Complete heart block present with RV apical pacing.  _____________  07/07/19 LOOP RECORDER REMOVAL  PACEMAKER IMPLANT   CONCLUSIONS:  1. Successful implantation of a St. Jude BiV pacemaker for symptomatic complete AV block after TAVR  2. No early apparent complications.  **Loop recorder was not able to be removed    _____________  Echo 07/27/19 IMPRESSIONS  1. Left ventricular ejection fraction, by visual estimation, is 50 to 55%. The left ventricle has normal function. There is mildly increased left ventricular hypertrophy.  2. Left ventricular diastolic parameters are indeterminate.  3. The left ventricle demonstrates regional wall motion abnormalities.  4. Global right ventricle has normal systolic function.The right ventricular size is not well visualized. Right vetricular wall thickness was not assessed.  5. Left atrial size was moderately dilated.  6. Right atrial size was not well visualized.  7. Mild to moderate mitral annular calcification.  8. The mitral valve is grossly normal. Trivial mitral valve regurgitation. No evidence of mitral stenosis.  9. The tricuspid valve is not well visualized. 10. The aortic valve is  normal in structure. Aortic valve regurgitation is not visualized. No evidence of aortic valve sclerosis or stenosis. 11. Edwards Sapien 3 THV (size 29 mm, model # 9600TFX, serial # [id number]) is in the AV position. Normal function. 12. The pulmonic valve was not well visualized. Pulmonic valve regurgitation is not visualized. 13. Normal pulmonary artery systolic pressure. 14. A pacer wire is visualized. 15. The inferior vena cava IVC is small suggesting low RA pressure and hypovolemia.  Aortic Valve: The aortic valve is normal in structure. Aortic valve regurgitation is not visualized. The aortic valve is structurally normal, with no evidence of sclerosis or stenosis. Aortic valve mean gradient measures 12.0 mmHg. Aortic valve peak gradient measures 22.3 mmHg. Edwards Sapien 3 THV (size 29 mm, model # 9600TFX, serial # [id number]) is in the AV position. Normal function.    Labs/Other Tests and Data Reviewed:    EKG:  No ECG reviewed.  Recent Labs: 07/02/2019: ALT 22; B  Natriuretic Peptide 249.8 07/08/2019: Hemoglobin 12.0; Magnesium 1.9; Platelets 148 07/27/2019: BUN 12; Creatinine, Ser 0.88; Potassium 4.7; Sodium 142   Recent Lipid Panel Lab Results  Component Value Date/Time   CHOL 78 08/16/2018 04:31 AM   TRIG 53 08/16/2018 04:31 AM   HDL 35 (L) 08/16/2018 04:31 AM   CHOLHDL 2.2 08/16/2018 04:31 AM   LDLCALC 32 08/16/2018 04:31 AM    Wt Readings from Last 3 Encounters:  07/29/19 (!) 360 lb (163.3 kg)  07/15/19 (!) 375 lb (170.1 kg)  07/08/19 (!) 377 lb 10.4 oz (171.3 kg)     Objective:    Vital Signs:  BP 132/77   Pulse 72   Ht 5\' 11"  (1.803 m)   Wt (!) 360 lb (163.3 kg)   BMI 50.21 kg/m    Well nourished, well developed male in no acute distress.   ASSESSMENT & PLAN:    Severe AS s/p TAVR: echo today shows EF improved to 50-55%, normally functioning TAVR with a mean gradient of 12 mm Hg and no PVL. He has NYHA class II symptoms. SBE prophylaxis discussed; he has  clindamycin. Continue on aspirin and plavix. Plavix can be discontinued after 6 months of therapy (12/2018).  CHB s/p PPM: followed by Dr. Ladona Ridgelaylor.   Chroniccombined S/DCHF: no s/s CHF. Continue current dosing of Lasix.  NICM: EF 35-40% --> 50-55%. Continue Toprol-XL 25 mg daily and losartan 25 mg daily.  Morbid obesity: Body mass index is 50.21 kg/m. Counseled on the importance of diet and exercise.   Incidental findings: pre TAVR CT showed   multiple small pulmonary nodules in the right lung-->Non-contrast chest CT at 3-6 months is recommended. I will get this set up today  2.1 cm intermediate attenuation lesion in the right kidney, incompletely characterized-->nonemergent abdominal MRI with and without IV gadolinium is recommended in the near future. Will get this set up at least 6 weeks out from him PPM. I discussed with EP who cleared him for MRI with loop recorder and PPM. We will have this set up today.   COVID-19 Education: The signs and symptoms of COVID-19 were discussed with the patient and how to seek care for testing (follow up with PCP or arrange E-visit).  The importance of social distancing was discussed today.  Time:   Today, I have spent 15 minutes with the patient with telehealth technology discussing the above problems.     Medication Adjustments/Labs and Tests Ordered: Current medicines are reviewed at length with the patient today.  Concerns regarding medicines are outlined above.   Tests Ordered: Orders Placed This Encounter  Procedures  . CT Chest Wo Contrast  . MR ABDOMEN WWO CONTRAST  . Basic Metabolic Panel (BMET)    Medication Changes: No orders of the defined types were placed in this encounter.    Disposition:  Follow up in 1 year(s)  Signed, Cline CrockKathryn Floretta Petro, PA-C  07/29/2019 3:47 PM    [facility]

## 2019-07-29 ENCOUNTER — Telehealth (INDEPENDENT_AMBULATORY_CARE_PROVIDER_SITE_OTHER): Payer: Medicare Other | Admitting: Physician Assistant

## 2019-07-29 ENCOUNTER — Ambulatory Visit: Payer: Medicare Other | Admitting: Physician Assistant

## 2019-07-29 ENCOUNTER — Other Ambulatory Visit: Payer: Self-pay

## 2019-07-29 ENCOUNTER — Other Ambulatory Visit (HOSPITAL_COMMUNITY): Payer: Medicare Other

## 2019-07-29 VITALS — BP 132/77 | HR 72 | Ht 71.0 in | Wt 360.0 lb

## 2019-07-29 DIAGNOSIS — R918 Other nonspecific abnormal finding of lung field: Secondary | ICD-10-CM

## 2019-07-29 DIAGNOSIS — Z952 Presence of prosthetic heart valve: Secondary | ICD-10-CM

## 2019-07-29 DIAGNOSIS — N289 Disorder of kidney and ureter, unspecified: Secondary | ICD-10-CM

## 2019-07-29 DIAGNOSIS — Z950 Presence of cardiac pacemaker: Secondary | ICD-10-CM

## 2019-07-29 DIAGNOSIS — I2510 Atherosclerotic heart disease of native coronary artery without angina pectoris: Secondary | ICD-10-CM | POA: Diagnosis not present

## 2019-07-29 DIAGNOSIS — I5042 Chronic combined systolic (congestive) and diastolic (congestive) heart failure: Secondary | ICD-10-CM | POA: Diagnosis not present

## 2019-07-29 NOTE — Patient Instructions (Signed)
Medication Instructions:  1) you may STOP PLAVIX 6 months after your TAVR (01/04/20)  Testing/Procedures: Joellen Jersey recommends you have a CHEST CT WITHOUT CONTRAST in 4 months.  Katie recommends you have an ABDOMINAL MRI late January, 2021.  Follow-Up: We will call you to arrange your 1 year TAVR echo and office visits.

## 2019-08-09 ENCOUNTER — Other Ambulatory Visit: Payer: Self-pay

## 2019-08-09 DIAGNOSIS — R918 Other nonspecific abnormal finding of lung field: Secondary | ICD-10-CM

## 2019-08-10 ENCOUNTER — Telehealth (HOSPITAL_COMMUNITY): Payer: Self-pay

## 2019-08-10 ENCOUNTER — Ambulatory Visit (HOSPITAL_COMMUNITY): Admission: RE | Admit: 2019-08-10 | Payer: Medicare Other | Source: Ambulatory Visit

## 2019-08-13 DIAGNOSIS — L97511 Non-pressure chronic ulcer of other part of right foot limited to breakdown of skin: Secondary | ICD-10-CM | POA: Diagnosis not present

## 2019-08-13 DIAGNOSIS — L84 Corns and callosities: Secondary | ICD-10-CM | POA: Diagnosis not present

## 2019-08-26 ENCOUNTER — Telehealth: Payer: Self-pay

## 2019-08-26 ENCOUNTER — Other Ambulatory Visit: Payer: Self-pay

## 2019-08-26 ENCOUNTER — Ambulatory Visit (HOSPITAL_COMMUNITY)
Admission: RE | Admit: 2019-08-26 | Discharge: 2019-08-26 | Disposition: A | Discharge status: Home / Self Care | Payer: Medicare Other | Source: Ambulatory Visit | Attending: Physician Assistant | Admitting: Physician Assistant

## 2019-08-26 DIAGNOSIS — N289 Disorder of kidney and ureter, unspecified: Secondary | ICD-10-CM | POA: Insufficient documentation

## 2019-08-26 DIAGNOSIS — N281 Cyst of kidney, acquired: Secondary | ICD-10-CM | POA: Diagnosis not present

## 2019-08-26 MED ORDER — GADOBUTROL 1 MMOL/ML IV SOLN
10.0000 mL | Freq: Once | INTRAVENOUS | Status: AC | PRN
  Administered 2019-08-26: 15:00:00 10 mL via INTRAVENOUS

## 2019-08-26 NOTE — Telephone Encounter (Signed)
lpmtcb 1/28 

## 2019-08-26 NOTE — Telephone Encounter (Signed)
The patient has been notified of the MRI result and verbalized understanding.  All questions (if any) were answered. Sigurd Sos, RN 08/26/2019 4:44 PM

## 2019-08-26 NOTE — Telephone Encounter (Signed)
-----   Message from Janetta Hora, PA-C sent at 08/26/2019  4:31 PM EST ----- Can you let him know MRI was reassuring showing renal cysts. No further work up indicated

## 2019-08-26 NOTE — Progress Notes (Signed)
St Jude rep [place] called and spoke with Any Tillery PAC and Dr Sharrell Ku regarding patient. Patietn approved to have MRI as pacemaker was place 07/07/19.  Vernona Rieger from [place]. [employer] here to program for MRI and to check thresholds pre-post MRI.

## 2019-08-26 NOTE — Telephone Encounter (Signed)
-----   Message from Kathryn R Thompson, PA-C sent at 08/26/2019  4:31 PM EST ----- Can you let him know MRI was reassuring showing renal cysts. No further work up indicated 

## 2019-08-27 DIAGNOSIS — I10 Essential (primary) hypertension: Secondary | ICD-10-CM | POA: Diagnosis not present

## 2019-08-27 DIAGNOSIS — Z950 Presence of cardiac pacemaker: Secondary | ICD-10-CM | POA: Diagnosis not present

## 2019-08-27 DIAGNOSIS — G4733 Obstructive sleep apnea (adult) (pediatric): Secondary | ICD-10-CM | POA: Diagnosis not present

## 2019-08-30 DIAGNOSIS — L97511 Non-pressure chronic ulcer of other part of right foot limited to breakdown of skin: Secondary | ICD-10-CM | POA: Diagnosis not present

## 2019-09-08 DIAGNOSIS — E1129 Type 2 diabetes mellitus with other diabetic kidney complication: Secondary | ICD-10-CM | POA: Diagnosis not present

## 2019-09-08 DIAGNOSIS — I5022 Chronic systolic (congestive) heart failure: Secondary | ICD-10-CM | POA: Diagnosis not present

## 2019-09-08 DIAGNOSIS — G8929 Other chronic pain: Secondary | ICD-10-CM | POA: Diagnosis not present

## 2019-09-09 DIAGNOSIS — Z950 Presence of cardiac pacemaker: Secondary | ICD-10-CM | POA: Diagnosis not present

## 2019-09-09 DIAGNOSIS — L97519 Non-pressure chronic ulcer of other part of right foot with unspecified severity: Secondary | ICD-10-CM | POA: Diagnosis not present

## 2019-09-23 DIAGNOSIS — L03116 Cellulitis of left lower limb: Secondary | ICD-10-CM | POA: Diagnosis not present

## 2019-10-04 ENCOUNTER — Telehealth: Payer: Self-pay | Admitting: Internal Medicine

## 2019-10-04 NOTE — Telephone Encounter (Signed)
Returned call to wife.  Advised ok to accompany Pt.

## 2019-10-04 NOTE — Telephone Encounter (Signed)
Corrie Dandy the patient's wife is calling requesting she attend the patient's upcoming appt scheduled for 10/06/19 at 1:45 PM due to the patient having trouble walking, having hearing problems in left ear, & having trouble understanding. Please advise.

## 2019-10-06 ENCOUNTER — Encounter: Payer: Self-pay | Admitting: Internal Medicine

## 2019-10-06 ENCOUNTER — Ambulatory Visit (INDEPENDENT_AMBULATORY_CARE_PROVIDER_SITE_OTHER): Payer: Medicare Other | Admitting: Internal Medicine

## 2019-10-06 ENCOUNTER — Other Ambulatory Visit: Payer: Self-pay

## 2019-10-06 DIAGNOSIS — I442 Atrioventricular block, complete: Secondary | ICD-10-CM | POA: Diagnosis not present

## 2019-10-06 DIAGNOSIS — Z950 Presence of cardiac pacemaker: Secondary | ICD-10-CM | POA: Diagnosis not present

## 2019-10-06 NOTE — Patient Instructions (Signed)
Medication Instructions:  Your physician recommends that you continue on your current medications as directed. Please refer to the Current Medication list given to you today.  Labwork: None ordered.  Testing/Procedures: None ordered.  Follow-Up: Your physician wants you to follow-up in: one year with Dr. Ladona Ridgel in [place].   You will receive a reminder letter in the mail two months in advance. If you don't receive a letter, please call our office to schedule the follow-up appointment.  Remote monitoring is used to monitor your Pacemaker from home. This monitoring reduces the number of office visits required to check your device to one time per year. It allows [place] to keep an eye on the functioning of your device to ensure it is working properly. You are scheduled for a device check from home on 10/14/2019. You may send your transmission at any time that day. If you have a wireless device, the transmission will be sent automatically. After your physician reviews your transmission, you will receive a postcard with your next transmission date.  Any Other Special Instructions Will Be Listed Below (If Applicable).  If you need a refill on your cardiac medications before your next appointment, please call your pharmacy.

## 2019-10-06 NOTE — Progress Notes (Signed)
HPI [name] returns today for followup. He has a h/o AS and underwent TAVR complicated by CHB and is s/p DDD PPM insertion. He has been stable in the interim though he notes that he injured his leg and it has been slow to heal. Allergies  Allergen Reactions  . Methyldopa-Hydrochlorothiazide Other (See Comments)    GYNOCOMASTIA  . Motrin [Ibuprofen] Other (See Comments)    GYNOCOMASTIA  . Nsaids Other (See Comments)    Patient has had lap band surgery- cannot tolerate this class of meds  . Amoxicillin Rash    Did it involve swelling of the face/tongue/throat, SOB, or low BP? No Did it involve sudden or severe rash/hives, skin peeling, or any reaction on the inside of your mouth or nose? Yes Did you need to seek medical attention at a hospital or doctor's office? No When did it last happen?More than 30 years ago If all above answers are "NO", may proceed with cephalosporin use. .     Current Outpatient Medications  Medication Sig Dispense Refill  . aspirin 81 MG chewable tablet Chew 1 tablet (81 mg total) by mouth daily.    Marland Kitchen atorvastatin (LIPITOR) 40 MG tablet Take 40 mg by mouth daily.     Marland Kitchen buPROPion (WELLBUTRIN XL) 300 MG 24 hr tablet Take 300 mg by mouth daily.     . Calcium-Magnesium-Zinc (CAL-MAG-ZINC PO) Take 1 tablet by mouth daily with breakfast.    . chlorhexidine (PERIDEX) 0.12 % solution Use as directed 15 mLs in the mouth or throat 2 (two) times daily. 120 mL 6  . clindamycin (CLEOCIN) 300 MG capsule Take 2 capsules (600 mg total) by mouth as directed. Take 2 tablets 1 hour prior to dental work including cleanings. 6 capsule 12  . clopidogrel (PLAVIX) 75 MG tablet Take 1 tablet (75 mg total) by mouth daily. 90 tablet 1  . famotidine (PEPCID) 40 MG tablet Take 40 mg by mouth daily.    . furosemide (LASIX) 40 MG tablet Take 1.5 tablets (60 mg total) by mouth daily. 45 tablet 0  . gabapentin (NEURONTIN) 600 MG tablet Take 600-1,200 mg by mouth See admin  instructions. Take 1 tablet (600 mg) by mouth every morning & take 2 tablets (1200 mg) by mouth at bedtime.    Marland Kitchen HYDROcodone-acetaminophen (NORCO/VICODIN) 5-325 MG tablet Take 1 tablet by mouth 4 (four) times daily as needed (pain.).     Marland Kitchen insulin NPH Human (NOVOLIN N RELION) 100 UNIT/ML injection Inject 10-40 Units into the skin 2 (two) times daily. *Hold for blood glucose reading less than 140*    . insulin regular (NOVOLIN R RELION) 100 units/mL injection Inject 0-20 Units into the skin See admin instructions. Sliding Scale Insulin two to three times a day before meals    . losartan (COZAAR) 25 MG tablet Take 1 tablet (25 mg total) by mouth daily. 90 tablet 3  . metoprolol succinate (TOPROL-XL) 25 MG 24 hr tablet Take 25 mg by mouth daily.    . Multiple Vitamin (MULTIVITAMIN WITH MINERALS) TABS tablet Take 1 tablet by mouth daily. One-A-Day Multivitamin    . pantoprazole (PROTONIX) 40 MG tablet Take 40 mg by mouth daily.     . silver sulfADIAZINE (SILVADENE) 1 % cream APPLY TO AFFECTED AREA BID    . tamsulosin (FLOMAX) 0.4 MG CAPS capsule Take 1 capsule (0.4 mg total) by mouth daily after supper. 30 capsule 0  . vitamin B-12 (CYANOCOBALAMIN) 1000 MCG tablet Take 1,000  mcg by mouth daily.     No current facility-administered medications for this visit.     Past Medical History:  Diagnosis Date  . Aortic stenosis, moderate 07/2018   felt to be moderate at cath Jan 2020  . CAD (coronary artery disease) 07/2018   mild non obstructive  . Chronic back pain    "mainly lower right now" (08/12/2018)  . Chronic combined systolic and diastolic heart failure (HCC)   . Depression   . Diabetic neuropathy (HCC)   . First degree AV block   . GERD (gastroesophageal reflux disease)   . High cholesterol   . Hypertension   . Morbid obesity with body mass index of 50 or higher (HCC)   . NICM (nonischemic cardiomyopathy) (HCC) 07/2018   EF 35-40%  . Pulmonary nodules    multiple small pulmonary  nodules in right lung noted on pre tavr CT. non contrast CT follow up in 3-6 monhts  . RBBB   . Renal lesion    right renal lesion --> MRI recommended  . S/P TAVR (transcatheter aortic valve replacement)    a. s/p TAVR with a 29 mm Edwards Sapien 3 THV via the TF apprach on 07/06/19 wtih Dr. Aundra Dubin and Dr Lavinia Sharps  . Severe aortic stenosis    s/p tavr  . Sleep apnea    "lost weight; not on CPAP anymore" (08/12/2018)  . Spinal stenosis of lumbar region    "w/slipped disc" (08/12/2018)  . Syncope    Felt to be secondary to autonomic dysfunction-s/p Loop recorder Feb 2019  . Type II diabetes mellitus (HCC)     ROS:   All systems reviewed and negative except as noted in the HPI.   Past Surgical History:  Procedure Laterality Date  . HERNIA REPAIR    . LAPAROSCOPIC GASTRIC BANDING    . LOOP RECORDER INSERTION N/A 09/01/2017   Procedure: LOOP RECORDER INSERTION;  Surgeon: Duke Salvia, MD;  Location: [facility] INVASIVE CV LAB;  Service: Cardiovascular;  Laterality: N/A;  . LOOP RECORDER REMOVAL N/A 07/07/2019   Procedure: LOOP RECORDER REMOVAL;  Surgeon: Marinus Maw, MD;  Location: [facility] INVASIVE CV LAB;  Service: Cardiovascular;  Laterality: N/A;  . PACEMAKER IMPLANT N/A 07/07/2019   Procedure: PACEMAKER IMPLANT;  Surgeon: Marinus Maw, MD;  Location: [facility] INVASIVE CV LAB;  Service: Cardiovascular;  Laterality: N/A;  . RIGHT/LEFT HEART CATH AND CORONARY ANGIOGRAPHY N/A 08/14/2018   Procedure: RIGHT/LEFT HEART CATH AND CORONARY ANGIOGRAPHY;  Surgeon: Yvonne Kendall, MD;  Location: [facility] INVASIVE CV LAB;  Service: Cardiovascular;  Laterality: N/A;  . SHOULDER ARTHROSCOPY Right   . TEE WITHOUT CARDIOVERSION N/A 07/06/2019   Procedure: TRANSESOPHAGEAL ECHOCARDIOGRAM (TEE);  Surgeon: Kathleene Hazel, MD;  Location: [facility] OR;  Service: Open Heart Surgery;  Laterality: N/A;  . TONSILLECTOMY    . TRANSCATHETER AORTIC VALVE REPLACEMENT, TRANSFEMORAL N/A 07/06/2019   Procedure: TRANSCATHETER AORTIC  VALVE REPLACEMENT, TRANSFEMORAL;  Surgeon: Kathleene Hazel, MD;  Location: [facility] OR;  Service: Open Heart Surgery;  Laterality: N/A;  . UMBILICAL HERNIA REPAIR       Family History  Problem Relation Age of Onset  . Cancer Mother   . Diabetes Mother   . Parkinson's disease Father   . Heart failure Father      Social History   Socioeconomic History  . Marital status: Married    Spouse name: Not on file  . Number of children: Not on file  . Years of education: Not on file  .  Highest education level: Not on file  Occupational History  . Not on file  Tobacco Use  . Smoking status: Never Smoker  . Smokeless tobacco: Never Used  Substance and Sexual Activity  . Alcohol use: No  . Drug use: Never  . Sexual activity: Not Currently  Other Topics Concern  . Not on file  Social History Narrative  . Not on file   Social Determinants of Health   Financial Resource Strain:   . Difficulty of Paying Living Expenses: Not on file  Food Insecurity:   . Worried About Charity fundraiser in the Last Year: Not on file  . Ran Out of Food in the Last Year: Not on file  Transportation Needs:   . Lack of Transportation (Medical): Not on file  . Lack of Transportation (Non-Medical): Not on file  Physical Activity:   . Days of Exercise per Week: Not on file  . Minutes of Exercise per Session: Not on file  Stress:   . Feeling of Stress : Not on file  Social Connections:   . Frequency of Communication with Friends and Family: Not on file  . Frequency of Social Gatherings with Friends and Family: Not on file  . Attends Religious Services: Not on file  . Active Member of Clubs or Organizations: Not on file  . Attends Archivist Meetings: Not on file  . Marital Status: Not on file  Intimate Partner Violence:   . Fear of Current or Ex-Partner: Not on file  . Emotionally Abused: Not on file  . Physically Abused: Not on file  . Sexually Abused: Not on file     BP 124/82    Pulse 80   Ht 5\' 11"  (1.803 m)   Wt (!) 378 lb (171.5 kg)   SpO2 97%   BMI 52.72 kg/m   Physical Exam:  Well appearing NAD HEENT: Unremarkable Neck:  No JVD, no thyromegally Lymphatics:  No adenopathy Back:  No CVA tenderness Lungs:  Clear HEART:  Regular rate rhythm, no murmurs, no rubs, no clicks Abd:  soft, positive bowel sounds, no organomegally, no rebound, no guarding Ext:  2 plus pulses, no edema, no cyanosis, no clubbing Skin:  No rashes no nodules Neuro:  CN II through XII intact, motor grossly intact  EKG - NSR with biv pacing  DEVICE  Normal device function.  See PaceArt for details.   Assess/Plan: 1. CHB - he is asymptomatic, s/p PPM insertion. Today he does have some conduction. 2. Obesity - I encouraged that the patient try and lose weight. 3. AS - he is s/p TAVR and doing well. NO change.  Cristopher Peru, M.D.

## 2019-10-11 ENCOUNTER — Ambulatory Visit: Payer: Medicare Other | Admitting: Cardiology

## 2019-10-14 ENCOUNTER — Ambulatory Visit (INDEPENDENT_AMBULATORY_CARE_PROVIDER_SITE_OTHER): Payer: Medicare Other | Admitting: *Deleted

## 2019-10-14 DIAGNOSIS — I5041 Acute combined systolic (congestive) and diastolic (congestive) heart failure: Secondary | ICD-10-CM

## 2019-10-14 LAB — CUP PACEART REMOTE DEVICE CHECK
Battery Remaining Longevity: 102 mo
Battery Remaining Percentage: 95.5 %
Battery Voltage: 3.02 V
Brady Statistic AP VP Percent: 2.9 %
Brady Statistic AP VS Percent: 1.2 %
Brady Statistic AS VP Percent: 89 %
Brady Statistic AS VS Percent: 2.1 %
Brady Statistic RA Percent Paced: 1 %
Date Time Interrogation Session: 20210318020009
Implantable Lead Implant Date: 20201209
Implantable Lead Implant Date: 20201209
Implantable Lead Location: 753859
Implantable Lead Location: 753860
Implantable Pulse Generator Implant Date: 20201209
Lead Channel Impedance Value: 540 Ohm
Lead Channel Impedance Value: 600 Ohm
Lead Channel Impedance Value: 840 Ohm
Lead Channel Pacing Threshold Amplitude: 0.75 V
Lead Channel Pacing Threshold Amplitude: 0.875 V
Lead Channel Pacing Threshold Amplitude: 1.25 V
Lead Channel Pacing Threshold Pulse Width: 0.5 ms
Lead Channel Pacing Threshold Pulse Width: 0.5 ms
Lead Channel Pacing Threshold Pulse Width: 0.5 ms
Lead Channel Sensing Intrinsic Amplitude: 3.5 mV
Lead Channel Sensing Intrinsic Amplitude: 5.7 mV
Lead Channel Setting Pacing Amplitude: 2 V
Lead Channel Setting Pacing Amplitude: 2 V
Lead Channel Setting Pacing Amplitude: 2.5 V
Lead Channel Setting Pacing Pulse Width: 0.5 ms
Lead Channel Setting Pacing Pulse Width: 0.5 ms
Lead Channel Setting Sensing Sensitivity: 2 mV
Pulse Gen Model: 3562
Pulse Gen Serial Number: 9165562

## 2019-10-15 NOTE — Progress Notes (Signed)
PPM Remote  

## 2019-10-18 DIAGNOSIS — I5022 Chronic systolic (congestive) heart failure: Secondary | ICD-10-CM | POA: Diagnosis not present

## 2019-10-18 DIAGNOSIS — Z79899 Other long term (current) drug therapy: Secondary | ICD-10-CM | POA: Diagnosis not present

## 2019-10-18 DIAGNOSIS — E1129 Type 2 diabetes mellitus with other diabetic kidney complication: Secondary | ICD-10-CM | POA: Diagnosis not present

## 2019-10-25 DIAGNOSIS — R7309 Other abnormal glucose: Secondary | ICD-10-CM | POA: Diagnosis not present

## 2019-10-25 DIAGNOSIS — E1122 Type 2 diabetes mellitus with diabetic chronic kidney disease: Secondary | ICD-10-CM | POA: Diagnosis not present

## 2019-10-25 DIAGNOSIS — I5022 Chronic systolic (congestive) heart failure: Secondary | ICD-10-CM | POA: Diagnosis not present

## 2019-10-25 DIAGNOSIS — M545 Low back pain: Secondary | ICD-10-CM | POA: Diagnosis not present

## 2019-11-01 DIAGNOSIS — R918 Other nonspecific abnormal finding of lung field: Secondary | ICD-10-CM | POA: Diagnosis not present

## 2019-11-02 LAB — BASIC METABOLIC PANEL
BUN/Creatinine Ratio: 15 (ref 10–24)
BUN: 11 mg/dL (ref 8–27)
CO2: 24 mmol/L (ref 20–29)
Calcium: 9 mg/dL (ref 8.6–10.2)
Chloride: 99 mmol/L (ref 96–106)
Creatinine, Ser: 0.73 mg/dL — ABNORMAL LOW (ref 0.76–1.27)
GFR calc Af Amer: 112 mL/min/{1.73_m2} (ref >59)
GFR calc non Af Amer: 97 mL/min/{1.73_m2} (ref >59)
Glucose: 176 mg/dL — ABNORMAL HIGH (ref 65–99)
Potassium: 4.9 mmol/L (ref 3.5–5.2)
Sodium: 141 mmol/L (ref 134–144)

## 2019-11-05 DIAGNOSIS — Z23 Encounter for immunization: Secondary | ICD-10-CM | POA: Diagnosis not present

## 2019-11-08 ENCOUNTER — Other Ambulatory Visit: Payer: Self-pay

## 2019-11-08 ENCOUNTER — Ambulatory Visit (HOSPITAL_COMMUNITY)
Admission: RE | Admit: 2019-11-08 | Discharge: 2019-11-08 | Disposition: A | Discharge status: Home / Self Care | Payer: Medicare Other | Source: Ambulatory Visit | Attending: Physician Assistant | Admitting: Physician Assistant

## 2019-11-08 DIAGNOSIS — R918 Other nonspecific abnormal finding of lung field: Secondary | ICD-10-CM | POA: Diagnosis not present

## 2019-11-09 ENCOUNTER — Other Ambulatory Visit: Payer: Self-pay | Admitting: Physician Assistant

## 2019-11-09 ENCOUNTER — Other Ambulatory Visit: Payer: Self-pay | Admitting: Oncology

## 2019-11-09 DIAGNOSIS — R918 Other nonspecific abnormal finding of lung field: Secondary | ICD-10-CM

## 2019-11-09 DIAGNOSIS — R911 Solitary pulmonary nodule: Secondary | ICD-10-CM

## 2019-11-09 NOTE — Progress Notes (Signed)
  Pulmonary Nodule Clinic Telephone Note [facility]   Received referral from Cline Crock, [place].  HPI: [name] is a 69 year old male with a history of hypertension, morbid obesity status post gastric banding in 2009, type 2 diabetes, OSA, RBBB first-degree AV block, loop recorder, GERD, CHF who presents for follow-up of a lung nodule.  He was evaluated by his PCP back in December 2020 for routine follow-up and found to have several small pulmonary nodules in right lung discovered with preimaging prior to his TAVR.   Review and Recommendations: I personally reviewed all patient's previous imaging including CT chest from 11/08/2019 which showed multifocal clustered segmental nodularity in the lungs bilaterally, waxing/waning on the prior now measuring up to 6 mm in right upper lobe and left upper lobe.  Follow-up CT chest is suggested in 3 to 6 months after appropriate antimicrobial therapy.  I recommend follow-up with non-contrast Chest Ct in 3-6 months.   Social History: Unclear Smoking history at this time.   High risk factors include: History of heavy smoking, exposure to asbestos, radium or uranium, personal family history of lung cancer, older age, sex (females greater than males), race (black and native [place] greater than weight), marginal speculation, upper lobe location, multiplicity (less than 5 nodules increases risk for malignancy) and emphysema and/or pulmonary fibrosis.   This recommendation follows the consensus statement: Guidelines for Management of Incidental Pulmonary Nodules Detected on CT Images: From the Fleischner Society 2017; Radiology 2017; 284:228-243.    I have placed order for CT scan without contrast to be completed in 3 months.   Disposition: Order placed for repeat CT chest. Will notify Micael Hampshire in scheduling. Hayley Rhode to call patient with appointment date and time. Return to pulmonary nodule clinic a few days after his repeat imaging  to discuss results and plan moving forward.  Durenda Hurt, NP 11/09/2019 10:31 AM

## 2019-11-12 ENCOUNTER — Telehealth: Payer: Self-pay | Admitting: *Deleted

## 2019-11-12 NOTE — Telephone Encounter (Signed)
Pt's wife made aware of referral to Lung Nodule Clinic. Pt's wife is in agreement to have upcoming CT scan and follow up as recommended. Made aware of upcoming appts for follow up CT scan and follow up appt with Durenda Hurt, NP in the Lung Nodule Clinic. Pt's wife verbalized understanding. Nothing further needed at this time.

## 2019-11-25 ENCOUNTER — Encounter: Payer: Self-pay | Admitting: Pulmonary Disease

## 2019-11-25 ENCOUNTER — Other Ambulatory Visit: Payer: Self-pay

## 2019-11-25 ENCOUNTER — Ambulatory Visit (INDEPENDENT_AMBULATORY_CARE_PROVIDER_SITE_OTHER): Payer: Medicare Other | Admitting: Pulmonary Disease

## 2019-11-25 VITALS — BP 138/66 | HR 83 | Ht 71.0 in | Wt 376.0 lb

## 2019-11-25 DIAGNOSIS — Z9989 Dependence on other enabling machines and devices: Secondary | ICD-10-CM

## 2019-11-25 DIAGNOSIS — Z6841 Body Mass Index (BMI) 40.0 and over, adult: Secondary | ICD-10-CM

## 2019-11-25 DIAGNOSIS — G4733 Obstructive sleep apnea (adult) (pediatric): Secondary | ICD-10-CM

## 2019-11-25 DIAGNOSIS — R918 Other nonspecific abnormal finding of lung field: Secondary | ICD-10-CM | POA: Diagnosis not present

## 2019-11-25 NOTE — Patient Instructions (Signed)
Thank you for visiting Dr. Tonia Brooms at [facility]. Today we recommend the following:  Orders Placed This Encounter  Procedures  . CT Chest Wo Contrast   Please schedule CT Chest in 1 year.   Return in about 1 year (around 11/24/2020) for with APP or Dr. Tonia Brooms.    Please do your part to reduce the spread of COVID-19.

## 2019-11-25 NOTE — Progress Notes (Signed)
Synopsis: Referred in April 2021 for pulmonary nodules by Janetta Hora, PA*  Subjective:   PATIENT ID: Daniel Valenzuela GENDER: male DOB: [birth year], MRN: [id number]  Chief Complaint  Patient presents with  . Consult    referred for lung nodules    69 year old gentleman morbidly obese, BMI 52.  Referred for pulmonary nodule evaluation.  Has history of aortic stenosis status post valve placement, history of complete heart block with device placement.  He had CT scan imaging which revealed nodule and is having his coronaries evaluated.  This led to CAT scan imaging of the chest which revealed upper lobe and lower lobe scattered clustered pulmonary nodules.  He is a lifelong non-smoker.  Of note his CT scan also revealed a food within the esophagus.  He has a history of gastric band procedure.  Unfortunately has not been able to lose any significant amount of weight.  He lost about 100 pounds but has gained 50-60 of it back.  Patient has no prior history of cancer.  In all nodules are subcentimeter in size largest being 6 mm.  Of note the patient also has obstructive sleep apnea likely due to body habitus.  He is noncompliant with his CPAP machine per his wife.   Past Medical History:  Diagnosis Date  . Aortic stenosis, moderate 07/2018   felt to be moderate at cath Jan 2020  . CAD (coronary artery disease) 07/2018   mild non obstructive  . Chronic back pain    "mainly lower right now" (08/12/2018)  . Chronic combined systolic and diastolic heart failure (HCC)   . Depression   . Diabetic neuropathy (HCC)   . First degree AV block   . GERD (gastroesophageal reflux disease)   . High cholesterol   . Hypertension   . Morbid obesity with body mass index of 50 or higher (HCC)   . NICM (nonischemic cardiomyopathy) (HCC) 07/2018   EF 35-40%  . Pulmonary nodules    multiple small pulmonary nodules in right lung noted on pre tavr CT. non contrast CT follow up in 3-6 monhts  . RBBB   .  Renal lesion    right renal lesion --> MRI recommended  . S/P TAVR (transcatheter aortic valve replacement)    a. s/p TAVR with a 29 mm Edwards Sapien 3 THV via the TF apprach on 07/06/19 wtih Dr. Aundra Dubin and Dr Lavinia Sharps  . Severe aortic stenosis    s/p tavr  . Sleep apnea    "lost weight; not on CPAP anymore" (08/12/2018)  . Spinal stenosis of lumbar region    "w/slipped disc" (08/12/2018)  . Syncope    Felt to be secondary to autonomic dysfunction-s/p Loop recorder Feb 2019  . Type II diabetes mellitus (HCC)      Family History  Problem Relation Age of Onset  . Cancer Mother   . Diabetes Mother   . Parkinson's disease Father   . Heart failure Father      Past Surgical History:  Procedure Laterality Date  . HERNIA REPAIR    . LAPAROSCOPIC GASTRIC BANDING    . LOOP RECORDER INSERTION N/A 09/01/2017   Procedure: LOOP RECORDER INSERTION;  Surgeon: Duke Salvia, MD;  Location: [facility] INVASIVE CV LAB;  Service: Cardiovascular;  Laterality: N/A;  . LOOP RECORDER REMOVAL N/A 07/07/2019   Procedure: LOOP RECORDER REMOVAL;  Surgeon: Marinus Maw, MD;  Location: [facility] INVASIVE CV LAB;  Service: Cardiovascular;  Laterality: N/A;  . PACEMAKER IMPLANT N/A 07/07/2019  Procedure: PACEMAKER IMPLANT;  Surgeon: Marinus Maw, MD;  Location: [facility] INVASIVE CV LAB;  Service: Cardiovascular;  Laterality: N/A;  . RIGHT/LEFT HEART CATH AND CORONARY ANGIOGRAPHY N/A 08/14/2018   Procedure: RIGHT/LEFT HEART CATH AND CORONARY ANGIOGRAPHY;  Surgeon: Yvonne Kendall, MD;  Location: [facility] INVASIVE CV LAB;  Service: Cardiovascular;  Laterality: N/A;  . SHOULDER ARTHROSCOPY Right   . TEE WITHOUT CARDIOVERSION N/A 07/06/2019   Procedure: TRANSESOPHAGEAL ECHOCARDIOGRAM (TEE);  Surgeon: Kathleene Hazel, MD;  Location: [facility] OR;  Service: Open Heart Surgery;  Laterality: N/A;  . TONSILLECTOMY    . TRANSCATHETER AORTIC VALVE REPLACEMENT, TRANSFEMORAL N/A 07/06/2019   Procedure: TRANSCATHETER AORTIC VALVE REPLACEMENT,  TRANSFEMORAL;  Surgeon: Kathleene Hazel, MD;  Location: [facility] OR;  Service: Open Heart Surgery;  Laterality: N/A;  . UMBILICAL HERNIA REPAIR      Social History   Socioeconomic History  . Marital status: Married    Spouse name: Not on file  . Number of children: Not on file  . Years of education: Not on file  . Highest education level: Not on file  Occupational History  . Not on file  Tobacco Use  . Smoking status: Never Smoker  . Smokeless tobacco: Never Used  Substance and Sexual Activity  . Alcohol use: No  . Drug use: Never  . Sexual activity: Not Currently  Other Topics Concern  . Not on file  Social History Narrative  . Not on file   Social Determinants of Health   Financial Resource Strain:   . Difficulty of Paying Living Expenses:   Food Insecurity:   . Worried About Programme researcher, broadcasting/film/video in the Last Year:   . Barista in the Last Year:   Transportation Needs:   . Freight forwarder (Medical):   Marland Kitchen Lack of Transportation (Non-Medical):   Physical Activity:   . Days of Exercise per Week:   . Minutes of Exercise per Session:   Stress:   . Feeling of Stress :   Social Connections:   . Frequency of Communication with Friends and Family:   . Frequency of Social Gatherings with Friends and Family:   . Attends Religious Services:   . Active Member of Clubs or Organizations:   . Attends Banker Meetings:   Marland Kitchen Marital Status:   Intimate Partner Violence:   . Fear of Current or Ex-Partner:   . Emotionally Abused:   Marland Kitchen Physically Abused:   . Sexually Abused:      Allergies  Allergen Reactions  . Methyldopa-Hydrochlorothiazide Other (See Comments)    GYNOCOMASTIA  . Motrin [Ibuprofen] Other (See Comments)    GYNOCOMASTIA  . Nsaids Other (See Comments)    Patient has had lap band surgery- cannot tolerate this class of meds  . Amoxicillin Rash    Did it involve swelling of the face/tongue/throat, SOB, or low BP? No Did it involve  sudden or severe rash/hives, skin peeling, or any reaction on the inside of your mouth or nose? Yes Did you need to seek medical attention at a hospital or doctor's office? No When did it last happen?More than 30 years ago If all above answers are "NO", may proceed with cephalosporin use. .     Outpatient Medications Prior to Visit  Medication Sig Dispense Refill  . aspirin 81 MG chewable tablet Chew 1 tablet (81 mg total) by mouth daily.    Marland Kitchen atorvastatin (LIPITOR) 40 MG tablet Take 40 mg by mouth daily.     Marland Kitchen  buPROPion (WELLBUTRIN XL) 300 MG 24 hr tablet Take 300 mg by mouth daily.     . Calcium-Magnesium-Zinc (CAL-MAG-ZINC PO) Take 1 tablet by mouth daily with breakfast.    . chlorhexidine (PERIDEX) 0.12 % solution Use as directed 15 mLs in the mouth or throat 2 (two) times daily. 120 mL 6  . clindamycin (CLEOCIN) 300 MG capsule Take 2 capsules (600 mg total) by mouth as directed. Take 2 tablets 1 hour prior to dental work including cleanings. 6 capsule 12  . clopidogrel (PLAVIX) 75 MG tablet Take 1 tablet (75 mg total) by mouth daily. 90 tablet 1  . famotidine (PEPCID) 40 MG tablet Take 40 mg by mouth daily.    . furosemide (LASIX) 40 MG tablet Take 1.5 tablets (60 mg total) by mouth daily. 45 tablet 0  . gabapentin (NEURONTIN) 600 MG tablet Take 600-1,200 mg by mouth See admin instructions. Take 1 tablet (600 mg) by mouth every morning & take 2 tablets (1200 mg) by mouth at bedtime.    Marland Kitchen HYDROcodone-acetaminophen (NORCO/VICODIN) 5-325 MG tablet Take 1 tablet by mouth 4 (four) times daily as needed (pain.).     Marland Kitchen insulin NPH Human (NOVOLIN N RELION) 100 UNIT/ML injection Inject 10-40 Units into the skin 2 (two) times daily. *Hold for blood glucose reading less than 140*    . insulin regular (NOVOLIN R RELION) 100 units/mL injection Inject 0-20 Units into the skin See admin instructions. Sliding Scale Insulin two to three times a day before meals    . losartan (COZAAR) 25 MG tablet  Take 1 tablet (25 mg total) by mouth daily. 90 tablet 3  . metoprolol succinate (TOPROL-XL) 25 MG 24 hr tablet Take 25 mg by mouth daily.    . Multiple Vitamin (MULTIVITAMIN WITH MINERALS) TABS tablet Take 1 tablet by mouth daily. One-A-Day Multivitamin    . pantoprazole (PROTONIX) 40 MG tablet Take 40 mg by mouth daily.     . silver sulfADIAZINE (SILVADENE) 1 % cream APPLY TO AFFECTED AREA BID    . tamsulosin (FLOMAX) 0.4 MG CAPS capsule Take 1 capsule (0.4 mg total) by mouth daily after supper. 30 capsule 0  . vitamin B-12 (CYANOCOBALAMIN) 1000 MCG tablet Take 1,000 mcg by mouth daily.     No facility-administered medications prior to visit.    Review of Systems  Constitutional: Negative for chills, fever, malaise/fatigue and weight loss.  HENT: Negative for hearing loss, sore throat and tinnitus.   Eyes: Negative for blurred vision and double vision.  Respiratory: Positive for shortness of breath. Negative for cough, hemoptysis, sputum production, wheezing and stridor.   Cardiovascular: Negative for chest pain, palpitations, orthopnea, leg swelling and PND.  Gastrointestinal: Negative for abdominal pain, constipation, diarrhea, heartburn, nausea and vomiting.  Genitourinary: Negative for dysuria, hematuria and urgency.  Musculoskeletal: Positive for back pain. Negative for joint pain and myalgias.  Skin: Negative for itching and rash.  Neurological: Negative for dizziness, tingling, weakness and headaches.  Endo/Heme/Allergies: Negative for environmental allergies. Does not bruise/bleed easily.  Psychiatric/Behavioral: Negative for depression. The patient is not nervous/anxious and does not have insomnia.   All other systems reviewed and are negative.    Objective:  Physical Exam Vitals reviewed.  Constitutional:      General: He is not in acute distress.    Appearance: He is well-developed.  HENT:     Head: Normocephalic and atraumatic.  Eyes:     General: No scleral  icterus.    Conjunctiva/sclera: Conjunctivae normal.  Pupils: Pupils are equal, round, and reactive to light.  Neck:     Vascular: No JVD.     Trachea: No tracheal deviation.  Cardiovascular:     Rate and Rhythm: Normal rate and regular rhythm.     Heart sounds: Normal heart sounds. No murmur.  Pulmonary:     Effort: Pulmonary effort is normal. No tachypnea, accessory muscle usage or respiratory distress.     Breath sounds: No stridor. No wheezing, rhonchi or rales.  Abdominal:     Comments: Morbidly obese pannus  Musculoskeletal:        General: No tenderness.     Cervical back: Neck supple.     Right lower leg: Edema present.     Left lower leg: Edema present.  Lymphadenopathy:     Cervical: No cervical adenopathy.  Skin:    General: Skin is warm and dry.     Capillary Refill: Capillary refill takes less than 2 seconds.     Findings: No rash.  Neurological:     Mental Status: He is alert and oriented to person, place, and time.  Psychiatric:        Behavior: Behavior normal.      Vitals:   11/25/19 1426  BP: 138/66  Pulse: 83  SpO2: 95%  Weight: (!) 376 lb (170.6 kg)  Height: 5\' 11"  (1.803 m)   95% on RA BMI Readings from Last 3 Encounters:  11/25/19 52.44 kg/m  10/06/19 52.72 kg/m  07/29/19 50.21 kg/m   Wt Readings from Last 3 Encounters:  11/25/19 (!) 376 lb (170.6 kg)  10/06/19 (!) 378 lb (171.5 kg)  07/29/19 (!) 360 lb (163.3 kg)     CBC    Component Value Date/Time   WBC 7.3 07/08/2019 0600   RBC 4.21 (L) 07/08/2019 0600   HGB 12.0 (L) 07/08/2019 0600   HCT 38.1 (L) 07/08/2019 0600   PLT 148 (L) 07/08/2019 0600   MCV 90.5 07/08/2019 0600   MCH 28.5 07/08/2019 0600   MCHC 31.5 07/08/2019 0600   RDW 15.1 07/08/2019 0600   LYMPHSABS 1.6 06/12/2019 0558   MONOABS 0.9 06/12/2019 0558   EOSABS 0.1 06/12/2019 0558   BASOSABS 0.0 06/12/2019 0558    Chest Imaging: Pulmonary Functions Testing Results: No flowsheet data found.  FeNO: none    Pathology: none  Echocardiogram: none  Heart Catheterization: none    Assessment & Plan:     ICD-10-CM   1. Lung nodules  R91.8 CT Chest Wo Contrast    CANCELED: CT Chest Wo Contrast  2. OSA on CPAP  G47.33    Z99.89   3. BMI 50.0-59.9, adult [facility])  Z68.43     Discussion:  This is a 69 year old gentleman, morbidly obese, BMI 52.4, 176 pounds.  He has incidentally found bilateral pulmonary nodules.  He is a lifelong non-smoker no previous history of cancer.  Largest nodule at 6 mm however they have changed some compared to previous images.  Today we explained the patient's CT images we looked at each of these today in the room.  They are likely low risk to be malignancy as he is a non-smoker with no history.  However he does have some change in the nodule.  Patient is somewhat anxious about this.  Plan: Repeat noncontrasted CT scan in 1 year for follow-up nodules. If documented stability 1 year would no longer need additional imaging. Discussed importance of weight loss today in the office. He also had fluid in his esophagus on  CT imaging, I suspect he has a component of aspiration.  This has happened before in middle of the night.  We discussed reflux precautions and the importance of this. He is going to follow-up with his primary care provider.  Return to clinic in 1 year.    Current Outpatient Medications:  .  aspirin 81 MG chewable tablet, Chew 1 tablet (81 mg total) by mouth daily., Disp:  , Rfl:  .  atorvastatin (LIPITOR) 40 MG tablet, Take 40 mg by mouth daily. , Disp: , Rfl:  .  buPROPion (WELLBUTRIN XL) 300 MG 24 hr tablet, Take 300 mg by mouth daily. , Disp: , Rfl:  .  Calcium-Magnesium-Zinc (CAL-MAG-ZINC PO), Take 1 tablet by mouth daily with breakfast., Disp: , Rfl:  .  chlorhexidine (PERIDEX) 0.12 % solution, Use as directed 15 mLs in the mouth or throat 2 (two) times daily., Disp: 120 mL, Rfl: 6 .  clindamycin (CLEOCIN) 300 MG capsule, Take 2 capsules (600 mg  total) by mouth as directed. Take 2 tablets 1 hour prior to dental work including cleanings., Disp: 6 capsule, Rfl: 12 .  clopidogrel (PLAVIX) 75 MG tablet, Take 1 tablet (75 mg total) by mouth daily., Disp: 90 tablet, Rfl: 1 .  famotidine (PEPCID) 40 MG tablet, Take 40 mg by mouth daily., Disp: , Rfl:  .  furosemide (LASIX) 40 MG tablet, Take 1.5 tablets (60 mg total) by mouth daily., Disp: 45 tablet, Rfl: 0 .  gabapentin (NEURONTIN) 600 MG tablet, Take 600-1,200 mg by mouth See admin instructions. Take 1 tablet (600 mg) by mouth every morning & take 2 tablets (1200 mg) by mouth at bedtime., Disp: , Rfl:  .  HYDROcodone-acetaminophen (NORCO/VICODIN) 5-325 MG tablet, Take 1 tablet by mouth 4 (four) times daily as needed (pain.). , Disp: , Rfl:  .  insulin NPH Human (NOVOLIN N RELION) 100 UNIT/ML injection, Inject 10-40 Units into the skin 2 (two) times daily. *Hold for blood glucose reading less than 140*, Disp: , Rfl:  .  insulin regular (NOVOLIN R RELION) 100 units/mL injection, Inject 0-20 Units into the skin See admin instructions. Sliding Scale Insulin two to three times a day before meals, Disp: , Rfl:  .  losartan (COZAAR) 25 MG tablet, Take 1 tablet (25 mg total) by mouth daily., Disp: 90 tablet, Rfl: 3 .  metoprolol succinate (TOPROL-XL) 25 MG 24 hr tablet, Take 25 mg by mouth daily., Disp: , Rfl:  .  Multiple Vitamin (MULTIVITAMIN WITH MINERALS) TABS tablet, Take 1 tablet by mouth daily. One-A-Day Multivitamin, Disp: , Rfl:  .  pantoprazole (PROTONIX) 40 MG tablet, Take 40 mg by mouth daily. , Disp: , Rfl:  .  silver sulfADIAZINE (SILVADENE) 1 % cream, APPLY TO AFFECTED AREA BID, Disp: , Rfl:  .  tamsulosin (FLOMAX) 0.4 MG CAPS capsule, Take 1 capsule (0.4 mg total) by mouth daily after supper., Disp: 30 capsule, Rfl: 0 .  vitamin B-12 (CYANOCOBALAMIN) 1000 MCG tablet, Take 1,000 mcg by mouth daily., Disp: , Rfl:    [name], DO [facility] 11/25/2019 2:42 PM

## 2019-12-03 DIAGNOSIS — Z23 Encounter for immunization: Secondary | ICD-10-CM | POA: Diagnosis not present

## 2019-12-23 ENCOUNTER — Other Ambulatory Visit (HOSPITAL_COMMUNITY): Payer: Self-pay | Admitting: Pain Medicine

## 2019-12-23 DIAGNOSIS — M545 Low back pain, unspecified: Secondary | ICD-10-CM

## 2020-01-05 LAB — CUP PACEART INCLINIC DEVICE CHECK
Date Time Interrogation Session: 20210310103649
Implantable Lead Implant Date: 20201209
Implantable Lead Implant Date: 20201209
Implantable Lead Implant Date: 20201209
Implantable Lead Location: 753858
Implantable Lead Location: 753859
Implantable Lead Location: 753860
Implantable Pulse Generator Implant Date: 20201209
Lead Channel Pacing Threshold Amplitude: 0.625 V
Lead Channel Pacing Threshold Amplitude: 0.75 V
Lead Channel Pacing Threshold Amplitude: 1.25 V
Lead Channel Pacing Threshold Pulse Width: 0.5 ms
Lead Channel Pacing Threshold Pulse Width: 0.5 ms
Lead Channel Pacing Threshold Pulse Width: 0.5 ms
Lead Channel Sensing Intrinsic Amplitude: 4.7 mV
Lead Channel Sensing Intrinsic Amplitude: 9.3 mV
Pulse Gen Model: 3562
Pulse Gen Serial Number: 9165562

## 2020-01-10 ENCOUNTER — Other Ambulatory Visit: Payer: Self-pay

## 2020-01-10 ENCOUNTER — Ambulatory Visit (HOSPITAL_COMMUNITY)
Admission: RE | Admit: 2020-01-10 | Discharge: 2020-01-10 | Disposition: A | Discharge status: Home / Self Care | Payer: Medicare Other | Source: Ambulatory Visit | Attending: Pain Medicine | Admitting: Pain Medicine

## 2020-01-10 DIAGNOSIS — M545 Low back pain, unspecified: Secondary | ICD-10-CM

## 2020-01-13 ENCOUNTER — Ambulatory Visit (INDEPENDENT_AMBULATORY_CARE_PROVIDER_SITE_OTHER): Payer: Medicare Other | Admitting: *Deleted

## 2020-01-13 DIAGNOSIS — I440 Atrioventricular block, first degree: Secondary | ICD-10-CM | POA: Diagnosis not present

## 2020-01-13 LAB — CUP PACEART REMOTE DEVICE CHECK
Battery Remaining Longevity: 98 mo
Battery Remaining Percentage: 95.5 %
Battery Voltage: 3.01 V
Brady Statistic AP VP Percent: 6.5 %
Brady Statistic AP VS Percent: 1 %
Brady Statistic AS VP Percent: 90 %
Brady Statistic AS VS Percent: 1 %
Brady Statistic RA Percent Paced: 4.8 %
Date Time Interrogation Session: 20210617025032
Implantable Lead Implant Date: 20201209
Implantable Lead Implant Date: 20201209
Implantable Lead Implant Date: 20201209
Implantable Lead Location: 753858
Implantable Lead Location: 753859
Implantable Lead Location: 753860
Implantable Pulse Generator Implant Date: 20201209
Lead Channel Impedance Value: 490 Ohm
Lead Channel Impedance Value: 600 Ohm
Lead Channel Impedance Value: 780 Ohm
Lead Channel Pacing Threshold Amplitude: 0.75 V
Lead Channel Pacing Threshold Amplitude: 0.75 V
Lead Channel Pacing Threshold Amplitude: 1.25 V
Lead Channel Pacing Threshold Pulse Width: 0.5 ms
Lead Channel Pacing Threshold Pulse Width: 0.5 ms
Lead Channel Pacing Threshold Pulse Width: 0.5 ms
Lead Channel Sensing Intrinsic Amplitude: 1.3 mV
Lead Channel Sensing Intrinsic Amplitude: 11 mV
Lead Channel Setting Pacing Amplitude: 2 V
Lead Channel Setting Pacing Amplitude: 2 V
Lead Channel Setting Pacing Amplitude: 2.5 V
Lead Channel Setting Pacing Pulse Width: 0.5 ms
Lead Channel Setting Pacing Pulse Width: 0.5 ms
Lead Channel Setting Sensing Sensitivity: 2 mV
Pulse Gen Model: 3562
Pulse Gen Serial Number: 9165562

## 2020-01-13 NOTE — Progress Notes (Signed)
Remote pacemaker transmission.   

## 2020-01-28 DIAGNOSIS — M48061 Spinal stenosis, lumbar region without neurogenic claudication: Secondary | ICD-10-CM | POA: Diagnosis not present

## 2020-02-02 ENCOUNTER — Telehealth: Payer: Self-pay | Admitting: *Deleted

## 2020-02-02 NOTE — Telephone Encounter (Signed)
Pt left message wanting to cancel his CT scan and follow up in the lung nodule clinic. Appts cancelled. Pt recently seen Dr. Tonia Brooms and their clinic will arrange for pt to have repeat CT scan in 1 year to follow up lung nodules. Nothing further needed at this time.

## 2020-02-07 ENCOUNTER — Ambulatory Visit: Payer: Medicare Other

## 2020-02-14 ENCOUNTER — Ambulatory Visit: Payer: Medicare Other | Admitting: Oncology

## 2020-02-15 DIAGNOSIS — M48061 Spinal stenosis, lumbar region without neurogenic claudication: Secondary | ICD-10-CM | POA: Diagnosis not present

## 2020-02-28 DIAGNOSIS — M48061 Spinal stenosis, lumbar region without neurogenic claudication: Secondary | ICD-10-CM | POA: Diagnosis not present

## 2020-03-14 DIAGNOSIS — G8929 Other chronic pain: Secondary | ICD-10-CM | POA: Diagnosis not present

## 2020-03-14 DIAGNOSIS — M48061 Spinal stenosis, lumbar region without neurogenic claudication: Secondary | ICD-10-CM | POA: Diagnosis not present

## 2020-03-28 DIAGNOSIS — Z794 Long term (current) use of insulin: Secondary | ICD-10-CM | POA: Diagnosis not present

## 2020-03-28 DIAGNOSIS — H2513 Age-related nuclear cataract, bilateral: Secondary | ICD-10-CM | POA: Diagnosis not present

## 2020-03-28 DIAGNOSIS — E113211 Type 2 diabetes mellitus with mild nonproliferative diabetic retinopathy with macular edema, right eye: Secondary | ICD-10-CM | POA: Diagnosis not present

## 2020-03-28 DIAGNOSIS — E113292 Type 2 diabetes mellitus with mild nonproliferative diabetic retinopathy without macular edema, left eye: Secondary | ICD-10-CM | POA: Diagnosis not present

## 2020-04-05 ENCOUNTER — Telehealth: Payer: Self-pay | Admitting: *Deleted

## 2020-04-05 NOTE — Telephone Encounter (Signed)
Pt is requesting recomendations from device clinic/Dr Ladona Ridgel regarding a possible spinal cord stimulator has been talking with pain management clinic about this and wanted to check if this would interfere with device - has not been scheduled

## 2020-04-05 NOTE — Telephone Encounter (Signed)
Spoke with Corrie Dandy( wife) per DPR. Patient may be considered for implantation of spinal stimulator. Informed that provider performing the surgery will need to contact our office before procedure to arrange for industry to be present at time of procedure to test stimulator to determine if it will interfere with pacemaker function. Fax # provided for Device Clinic. Questions answered.

## 2020-04-07 DIAGNOSIS — E113211 Type 2 diabetes mellitus with mild nonproliferative diabetic retinopathy with macular edema, right eye: Secondary | ICD-10-CM | POA: Diagnosis not present

## 2020-04-07 DIAGNOSIS — H40013 Open angle with borderline findings, low risk, bilateral: Secondary | ICD-10-CM | POA: Diagnosis not present

## 2020-04-07 DIAGNOSIS — H2513 Age-related nuclear cataract, bilateral: Secondary | ICD-10-CM | POA: Diagnosis not present

## 2020-04-07 DIAGNOSIS — H353131 Nonexudative age-related macular degeneration, bilateral, early dry stage: Secondary | ICD-10-CM | POA: Diagnosis not present

## 2020-04-07 DIAGNOSIS — E113292 Type 2 diabetes mellitus with mild nonproliferative diabetic retinopathy without macular edema, left eye: Secondary | ICD-10-CM | POA: Diagnosis not present

## 2020-04-13 ENCOUNTER — Ambulatory Visit (INDEPENDENT_AMBULATORY_CARE_PROVIDER_SITE_OTHER): Payer: Medicare Other | Admitting: *Deleted

## 2020-04-13 DIAGNOSIS — I442 Atrioventricular block, complete: Secondary | ICD-10-CM

## 2020-04-13 LAB — CUP PACEART REMOTE DEVICE CHECK
Battery Remaining Longevity: 107 mo
Battery Remaining Percentage: 95.5 %
Battery Voltage: 3.01 V
Brady Statistic AP VP Percent: 8.9 %
Brady Statistic AP VS Percent: 1 %
Brady Statistic AS VP Percent: 88 %
Brady Statistic AS VS Percent: 1 %
Brady Statistic RA Percent Paced: 7.7 %
Date Time Interrogation Session: 20210916033027
Implantable Lead Implant Date: 20201209
Implantable Lead Implant Date: 20201209
Implantable Lead Implant Date: 20201209
Implantable Lead Location: 753858
Implantable Lead Location: 753859
Implantable Lead Location: 753860
Implantable Pulse Generator Implant Date: 20201209
Lead Channel Impedance Value: 530 Ohm
Lead Channel Impedance Value: 590 Ohm
Lead Channel Impedance Value: 860 Ohm
Lead Channel Pacing Threshold Amplitude: 0.625 V
Lead Channel Pacing Threshold Amplitude: 0.75 V
Lead Channel Pacing Threshold Amplitude: 1.25 V
Lead Channel Pacing Threshold Pulse Width: 0.5 ms
Lead Channel Pacing Threshold Pulse Width: 0.5 ms
Lead Channel Pacing Threshold Pulse Width: 0.5 ms
Lead Channel Sensing Intrinsic Amplitude: 10.8 mV
Lead Channel Sensing Intrinsic Amplitude: 2.1 mV
Lead Channel Setting Pacing Amplitude: 2 V
Lead Channel Setting Pacing Amplitude: 2 V
Lead Channel Setting Pacing Amplitude: 2.5 V
Lead Channel Setting Pacing Pulse Width: 0.5 ms
Lead Channel Setting Pacing Pulse Width: 0.5 ms
Lead Channel Setting Sensing Sensitivity: 2 mV
Pulse Gen Model: 3562
Pulse Gen Serial Number: 9165562

## 2020-04-14 NOTE — Progress Notes (Signed)
Remote pacemaker transmission.   

## 2020-04-18 ENCOUNTER — Other Ambulatory Visit: Payer: Self-pay | Admitting: Physician Assistant

## 2020-04-18 DIAGNOSIS — Z952 Presence of prosthetic heart valve: Secondary | ICD-10-CM

## 2020-04-20 DIAGNOSIS — M961 Postlaminectomy syndrome, not elsewhere classified: Secondary | ICD-10-CM | POA: Diagnosis not present

## 2020-04-25 DIAGNOSIS — G894 Chronic pain syndrome: Secondary | ICD-10-CM | POA: Diagnosis not present

## 2020-04-26 DIAGNOSIS — G894 Chronic pain syndrome: Secondary | ICD-10-CM | POA: Diagnosis not present

## 2020-05-01 ENCOUNTER — Telehealth: Payer: Self-pay | Admitting: Internal Medicine

## 2020-05-01 NOTE — Telephone Encounter (Signed)
Returned call.  Daniel Valenzuela will call this nurse back.

## 2020-05-01 NOTE — Telephone Encounter (Signed)
Spoke with Daniel Valenzuela at [facility] pain clinic.  She states she spoke with someone last week and wanted to confirm that a representative would be present for patient procedure tomorrow.  Advised we have not received any cardiac clearance requests for an order from Dr. Ladona Ridgel.  Offered my fax number for her to send request to.  She states she doesn't have a clearance form request and that is not how they have done this for other patients.  She already had phone number for [employer] and will call them as she believes she spoke with them last week.

## 2020-05-01 NOTE — Telephone Encounter (Signed)
New Message  Claris Che is calling from [facility] to confirm that Dr Ladona Ridgel will be at the procedure tomorrow at 12:30pm   Please call

## 2020-05-01 NOTE — Telephone Encounter (Signed)
Call back from [place].  Calling to confirm that a rep for Pt's pacemaker will be at procedure tomorrow.  Forwarded to device clinic for follow up.

## 2020-05-02 DIAGNOSIS — F419 Anxiety disorder, unspecified: Secondary | ICD-10-CM | POA: Diagnosis not present

## 2020-05-02 DIAGNOSIS — M961 Postlaminectomy syndrome, not elsewhere classified: Secondary | ICD-10-CM | POA: Diagnosis not present

## 2020-05-03 ENCOUNTER — Other Ambulatory Visit: Payer: Self-pay

## 2020-05-03 ENCOUNTER — Ambulatory Visit (INDEPENDENT_AMBULATORY_CARE_PROVIDER_SITE_OTHER): Payer: Medicare Other | Admitting: Physician Assistant

## 2020-05-03 ENCOUNTER — Encounter: Payer: Self-pay | Admitting: Physician Assistant

## 2020-05-03 VITALS — BP 138/64 | HR 181 | Ht 71.0 in | Wt 394.8 lb

## 2020-05-03 DIAGNOSIS — I442 Atrioventricular block, complete: Secondary | ICD-10-CM | POA: Diagnosis not present

## 2020-05-03 DIAGNOSIS — Z79899 Other long term (current) drug therapy: Secondary | ICD-10-CM | POA: Diagnosis not present

## 2020-05-03 DIAGNOSIS — I5041 Acute combined systolic (congestive) and diastolic (congestive) heart failure: Secondary | ICD-10-CM | POA: Diagnosis not present

## 2020-05-03 DIAGNOSIS — R42 Dizziness and giddiness: Secondary | ICD-10-CM | POA: Diagnosis not present

## 2020-05-03 DIAGNOSIS — Z950 Presence of cardiac pacemaker: Secondary | ICD-10-CM | POA: Diagnosis not present

## 2020-05-03 DIAGNOSIS — Z952 Presence of prosthetic heart valve: Secondary | ICD-10-CM | POA: Diagnosis not present

## 2020-05-03 DIAGNOSIS — I5022 Chronic systolic (congestive) heart failure: Secondary | ICD-10-CM | POA: Diagnosis not present

## 2020-05-03 DIAGNOSIS — E875 Hyperkalemia: Secondary | ICD-10-CM | POA: Diagnosis not present

## 2020-05-03 DIAGNOSIS — E785 Hyperlipidemia, unspecified: Secondary | ICD-10-CM | POA: Diagnosis not present

## 2020-05-03 DIAGNOSIS — E1129 Type 2 diabetes mellitus with other diabetic kidney complication: Secondary | ICD-10-CM | POA: Diagnosis not present

## 2020-05-03 DIAGNOSIS — I452 Bifascicular block: Secondary | ICD-10-CM

## 2020-05-03 MED ORDER — FUROSEMIDE 40 MG PO TABS: ORAL_TABLET | ORAL | 3 refills | Status: AC

## 2020-05-03 NOTE — Patient Instructions (Signed)
Medication Instructions:  Your physician has recommended you make the following change in your medication:  1.  INCREASE the Lasix to 4o mg taking 1 1/2 tablet in the mornings and 1 tablet in the afternoons, for 5 days then go back to 1 1/2 tablet in the morning and 1 tablet in the afternoon only as needed for shortness of breath   *If you need a refill on your cardiac medications before your next appointment, please call your pharmacy*   Lab Work: TODAY: BMET & PRO BNP  If you have labs (blood work) drawn today and your tests are completely normal, you will receive your results only by: Marland Kitchen MyChart Message (if you have MyChart) OR . A paper copy in the mail If you have any lab test that is abnormal or we need to change your treatment, we will call you to review the results.   Testing/Procedures: None ordered3   Follow-Up: At [facility], you and your health needs are our priority.  As part of our continuing mission to provide you with exceptional heart care, we have created designated Provider Care Teams.  These Care Teams include your primary Cardiologist (physician) and Advanced Practice Providers (APPs -  Physician Assistants and Nurse Practitioners) who all work together to provide you with the care you need, when you need it.  We recommend signing up for the patient portal called "MyChart".  Sign up information is provided on this After Visit Summary.  MyChart is used to connect with patients for Virtual Visits (Telemedicine).  Patients are able to view lab/test results, encounter notes, upcoming appointments, etc.  Non-urgent messages can be sent to your provider as well.   To learn more about what you can do with MyChart, go to [email or web address].    Your next appointment:   FOLLOW UP AS SCHEDULED   The format for your next appointment:   In Person  Provider:   Carlean Jews, PA-C   Other Instructions

## 2020-05-03 NOTE — Progress Notes (Signed)
Cardiology Office Note    Date:  05/03/2020   ID:  Admiral, Wieser 11/02/1950, MRN [id number]  PCP:  Carylon Perches, MD  Cardiologist:  Dr.Taylor   Chief Complaint: Dizziness   History of Present Illness:   Daniel Valenzuela is a 69 y.o. male with hx of HTN, severe AS s/p TVAR 06/2019, complete heart block s/p pacemaker, morbid obesitys/pgastric banding in 2009(BMI 50), IDDM, sleep apnea-not on C-pap, LBBB,  syncope felt to be vasovagal (s/p Loop Feb 2019) seen for shortness of breath and dizziness.  Patient's cardiac history dates back to February 2019 when he was admitted following a syncopal episode. Etiology was eventually attributed to vasovagal symptomsand have not recurred since verapamil was discontinued, but the patient underwent loop recorder implantation because of underlying right bundle branch block with long first-degree AV block.   The patient was admitted January 2020 with acute heart failure.  His echocardiogram jan 2020 showed an ejection fraction of 35 to 40% with moderate to severe AS.  Catheterization revealed mild coronary disease, with moderate AS.   Admitted 05/2019 with  acute exacerbation of chronic congestive heart failure and pulmonary edema. Follow-up echocardiogram revealed left ventricular ejection fraction down further to 30-35% with evidence of severe aortic stenosis ans subsequently underwent TVAR 07/06/2019 however developed CHBand underwentsuccessful implantation of a St[employer] Jude BiV pacemakerand loop removal by Dr. Ladona Ridgel.However, apparently loop recorder was not able to be removed.  Last seen by Dr. Ladona Ridgel 09/2019.  Two week ago patient felt sudden onset dyspnea and dizziness for 1-2 minutes. No associated palpitations or syncope. Had some orthopnea. Reports weight gain but does not remember #. Noted LE edema. He took extra lasix 40mg  in evening for few day and symptoms resolved. Here today for follow up.   Past Medical History:  Diagnosis Date    . Aortic stenosis, moderate 07/2018   felt to be moderate at cath Jan 2020  . CAD (coronary artery disease) 07/2018   mild non obstructive  . Chronic back pain    "mainly lower right now" (08/12/2018)  . Chronic combined systolic and diastolic heart failure (HCC)   . Depression   . Diabetic neuropathy (HCC)   . First degree AV block   . GERD (gastroesophageal reflux disease)   . High cholesterol   . Hypertension   . Morbid obesity with body mass index of 50 or higher (HCC)   . NICM (nonischemic cardiomyopathy) (HCC) 07/2018   EF 35-40%  . Pulmonary nodules    multiple small pulmonary nodules in right lung noted on pre tavr CT. non contrast CT follow up in 3-6 monhts  . RBBB   . Renal lesion    right renal lesion --> MRI recommended  . S/P TAVR (transcatheter aortic valve replacement)    a. s/p TAVR with a 29 mm Edwards Sapien 3 THV via the TF apprach on 07/06/19 wtih Dr. Aundra Dubin and Dr Lavinia Sharps  . Severe aortic stenosis    s/p tavr  . Sleep apnea    "lost weight; not on CPAP anymore" (08/12/2018)  . Spinal stenosis of lumbar region    "w/slipped disc" (08/12/2018)  . Syncope    Felt to be secondary to autonomic dysfunction-s/p Loop recorder Feb 2019  . Type II diabetes mellitus (HCC)     Past Surgical History:  Procedure Laterality Date  . HERNIA REPAIR    . LAPAROSCOPIC GASTRIC BANDING    . LOOP RECORDER INSERTION N/A 09/01/2017   Procedure:  LOOP RECORDER INSERTION;  Surgeon: Duke Salvia, MD;  Location[facility] INVASIVE CV LAB;  Service: Cardiovascular;  Laterality: N/A;  . LOOP RECORDER REMOVAL N/A 07/07/2019   Procedure: LOOP RECORDER REMOVAL;  Surgeon: Marinus Maw, MD;  Location: [facility] INVASIVE CV LAB;  Service: Cardiovascular;  Laterality: N/A;  . PACEMAKER IMPLANT N/A 07/07/2019   Procedure: PACEMAKER IMPLANT;  Surgeon: Marinus Maw, MD;  Location: [facility] INVASIVE CV LAB;  Service: Cardiovascular;  Laterality: N/A;  . RIGHT/LEFT HEART CATH AND CORONARY ANGIOGRAPHY N/A  08/14/2018   Procedure: RIGHT/LEFT HEART CATH AND CORONARY ANGIOGRAPHY;  Surgeon: Yvonne Kendall, MD;  Location: [facility] INVASIVE CV LAB;  Service: Cardiovascular;  Laterality: N/A;  . SHOULDER ARTHROSCOPY Right   . TEE WITHOUT CARDIOVERSION N/A 07/06/2019   Procedure: TRANSESOPHAGEAL ECHOCARDIOGRAM (TEE);  Surgeon: Kathleene Hazel, MD;  Location: [facility] OR;  Service: Open Heart Surgery;  Laterality: N/A;  . TONSILLECTOMY    . TRANSCATHETER AORTIC VALVE REPLACEMENT, TRANSFEMORAL N/A 07/06/2019   Procedure: TRANSCATHETER AORTIC VALVE REPLACEMENT, TRANSFEMORAL;  Surgeon: Kathleene Hazel, MD;  Location: [facility] OR;  Service: Open Heart Surgery;  Laterality: N/A;  . UMBILICAL HERNIA REPAIR      Current Medications: Prior to Admission medications   Medication Sig Start Date End Date Taking? Authorizing Provider  aspirin 81 MG chewable tablet Chew 1 tablet (81 mg total) by mouth daily. 07/08/19   Janetta Hora, PA-C  atorvastatin (LIPITOR) 40 MG tablet Take 40 mg by mouth daily.     [provider]  buPROPion (WELLBUTRIN XL) 300 MG 24 hr tablet Take 300 mg by mouth daily.  05/25/19   [provider]  Calcium-Magnesium-Zinc (CAL-MAG-ZINC PO) Take 1 tablet by mouth daily with breakfast.    [provider]  chlorhexidine (PERIDEX) 0.12 % solution Use as directed 15 mLs in the mouth or throat 2 (two) times daily. 06/10/19   Janetta Hora, PA-C  clindamycin (CLEOCIN) 300 MG capsule Take 2 capsules (600 mg total) by mouth as directed. Take 2 tablets 1 hour prior to dental work including cleanings. 07/15/19   Janetta Hora, PA-C  famotidine (PEPCID) 40 MG tablet Take 40 mg by mouth daily. 07/29/19   [provider]  furosemide (LASIX) 40 MG tablet Take 1.5 tablets (60 mg total) by mouth daily. 08/18/18   Elgergawy, Leana Roe, MD  gabapentin (NEURONTIN) 600 MG tablet Take 600-1,200 mg by mouth See admin instructions. Take 1 tablet (600 mg) by mouth every  morning & take 2 tablets (1200 mg) by mouth at bedtime.    [provider]  HYDROcodone-acetaminophen (NORCO/VICODIN) 5-325 MG tablet Take 1 tablet by mouth 4 (four) times daily as needed (pain.).     [provider]  insulin NPH Human (NOVOLIN N RELION) 100 UNIT/ML injection Inject 10-40 Units into the skin 2 (two) times daily. *Hold for blood glucose reading less than 140*    [provider]  insulin regular (NOVOLIN R RELION) 100 units/mL injection Inject 0-20 Units into the skin See admin instructions. Sliding Scale Insulin two to three times a day before meals    [provider]  losartan (COZAAR) 25 MG tablet Take 1 tablet (25 mg total) by mouth daily. 07/15/19 10/13/19  Janetta Hora, PA-C  metoprolol succinate (TOPROL-XL) 25 MG 24 hr tablet Take 25 mg by mouth daily. 06/19/19   [provider]  Multiple Vitamin (MULTIVITAMIN WITH MINERALS) TABS tablet Take 1 tablet by mouth daily. One-A-Day Multivitamin  [provider]  pantoprazole (PROTONIX) 40 MG tablet Take 40 mg by mouth daily.     [provider]  silver sulfADIAZINE (SILVADENE) 1 % cream APPLY TO AFFECTED AREA BID 06/28/19   [provider]  tamsulosin (FLOMAX) 0.4 MG CAPS capsule Take 1 capsule (0.4 mg total) by mouth daily after supper. 07/15/19   Janetta Hora, PA-C  vitamin B-12 (CYANOCOBALAMIN) 1000 MCG tablet Take 1,000 mcg by mouth daily.    [provider]    Allergies:   Methyldopa-hydrochlorothiazide, Motrin [ibuprofen], Nsaids, and Amoxicillin   Social History   Socioeconomic History  . Marital status: Married    Spouse name: Not on file  . Number of children: Not on file  . Years of education: Not on file  . Highest education level: Not on file  Occupational History  . Not on file  Tobacco Use  . Smoking status: Never Smoker  . Smokeless tobacco: Never Used  Vaping Use  . Vaping Use: Never used  Substance and Sexual  Activity  . Alcohol use: No  . Drug use: Never  . Sexual activity: Not Currently  Other Topics Concern  . Not on file  Social History Narrative  . Not on file   Social Determinants of Health   Financial Resource Strain:   . Difficulty of Paying Living Expenses: Not on file  Food Insecurity:   . Worried About Programme researcher, broadcasting/film/video in the Last Year: Not on file  . Ran Out of Food in the Last Year: Not on file  Transportation Needs:   . Lack of Transportation (Medical): Not on file  . Lack of Transportation (Non-Medical): Not on file  Physical Activity:   . Days of Exercise per Week: Not on file  . Minutes of Exercise per Session: Not on file  Stress:   . Feeling of Stress : Not on file  Social Connections:   . Frequency of Communication with Friends and Family: Not on file  . Frequency of Social Gatherings with Friends and Family: Not on file  . Attends Religious Services: Not on file  . Active Member of Clubs or Organizations: Not on file  . Attends Banker Meetings: Not on file  . Marital Status: Not on file     Family History:  The patient's family history includes Cancer in his mother; Diabetes in his mother; Heart failure in his father; Parkinson's disease in his father.   ROS:   Please see the history of present illness.    ROS All other systems reviewed and are negative.   PHYSICAL EXAM:   VS:  BP 138/64   Pulse (!) 181   Ht 5\' 11"  (1.803 m)   Wt (!) 394 lb 12.8 oz (179.1 kg)   SpO2 91%   BMI 55.06 kg/m    GEN: Well nourished, well developed, in no acute distress  HEENT: normal  Neck: no JVD, carotid bruits, or masses Cardiac: RRR; no murmurs, rubs, or gallops,1 + BL  edema  Respiratory:  clear to auscultation bilaterally, normal work of breathing GI: soft, nontender, nondistended, + BS MS: no deformity or atrophy  Skin: warm and dry, no rash Neuro:  Alert and Oriented x 3, Strength and sensation are intact Psych: euthymic mood, full  affect  Wt Readings from Last 3 Encounters:  05/03/20 (!) 394 lb 12.8 oz (179.1 kg)  11/25/19 (!) 376 lb (170.6 kg)  10/06/19 (!) 378 lb (171.5 kg)  Studies/Labs Reviewed:   EKG:  EKG is ordered today.  The ekg ordered today demonstrates SR, LBBB  Recent Labs: 07/02/2019: ALT 22; B Natriuretic Peptide 249.8 07/08/2019: Hemoglobin 12.0; Magnesium 1.9; Platelets 148 11/01/2019: BUN 11; Creatinine, Ser 0.73; Potassium 4.9; Sodium 141   Lipid Panel    Component Value Date/Time   CHOL 78 08/16/2018 0431   TRIG 53 08/16/2018 0431   HDL 35 (L) 08/16/2018 0431   CHOLHDL 2.2 08/16/2018 0431   VLDL 11 08/16/2018 0431   LDLCALC 32 08/16/2018 0431    Additional studies/ records that were reviewed today include:   Echocardiogram: 06/2019 1. Left ventricular ejection fraction, by visual estimation, is 50 to  55%. The left ventricle has normal function. There is mildly increased  left ventricular hypertrophy.  2. Left ventricular diastolic parameters are indeterminate.  3. The left ventricle demonstrates regional wall motion abnormalities.  4. Global right ventricle has normal systolic function.The right  ventricular size is not well visualized. Right vetricular wall thickness  was not assessed.  5. Left atrial size was moderately dilated.  6. Right atrial size was not well visualized.  7. Mild to moderate mitral annular calcification.  8. The mitral valve is grossly normal. Trivial mitral valve  regurgitation. No evidence of mitral stenosis.  9. The tricuspid valve is not well visualized.  10. The aortic valve is normal in structure. Aortic valve regurgitation is  not visualized. No evidence of aortic valve sclerosis or stenosis.  11. Edwards Sapien 3 THV (size 29 mm, model # 9600TFX, serial # [id number])  is in the AV position. Normal function.  12. The pulmonic valve was not well visualized. Pulmonic valve  regurgitation is not visualized.  13. Normal pulmonary artery  systolic pressure.  14. A pacer wire is visualized.  15. The inferior vena cava IVC is small suggesting low RA pressure and  hypovolemia.   Cardiac Catheterization:     ASSESSMENT & PLAN:    1. Acute on chronic systolic and diastolic heart failure - LVEF normalized after TVAR by echo 06/2019 Patient felt better after taking higher dose of Lasix last week.  His dizziness resolved and breathing improved.  However still 15 pounds above compared to last office visit weight.  He reports compliance with low-sodium diet. Check BMP and BNP.  Increase Lasix as below.  2.  Severe aortic stenosis s/p TAVR -Has upcoming follow-up and echocardiogram in December  3.  Dizziness -Resolved after diuresis. -Last device check April 13, 2020 showed 6-second atrial tachycardia. -Patient denies palpitation while having dizziness which was lasting for couple of seconds -If recurrent dizziness may consider earlier device check  4.  Hypertension  -blood pressure stable on Toprol -Previously his losartan discontinued by PCP secondary to hyperkalemia   Medication Adjustments/Labs and Tests Ordered: Current medicines are reviewed at length with the patient today.  Concerns regarding medicines are outlined above.  Medication changes, Labs and Tests ordered today are listed in the Patient Instructions below. Patient Instructions  Medication Instructions:  Your physician has recommended you make the following change in your medication:  1.  INCREASE the Lasix to 4o mg taking 1 1/2 tablet in the mornings and 1 tablet in the afternoons, for 5 days then go back to 1 1/2 tablet in the morning and 1 tablet in the afternoon only as needed for shortness of breath   *If you need a refill on your cardiac medications before your next appointment, please call your pharmacy*   Lab Work:  TODAY: BMET & PRO BNP  If you have labs (blood work) drawn today and your tests are completely normal, you will receive your  results only by: Marland Kitchen MyChart Message (if you have MyChart) OR . A paper copy in the mail If you have any lab test that is abnormal or we need to change your treatment, we will call you to review the results.   Testing/Procedures: None ordered3   Follow-Up: At [facility], you and your health needs are our priority.  As part of our continuing mission to provide you with exceptional heart care, we have created designated Provider Care Teams.  These Care Teams include your primary Cardiologist (physician) and Advanced Practice Providers (APPs -  Physician Assistants and Nurse Practitioners) who all work together to provide you with the care you need, when you need it.  We recommend signing up for the patient portal called "MyChart".  Sign up information is provided on this After Visit Summary.  MyChart is used to connect with patients for Virtual Visits (Telemedicine).  Patients are able to view lab/test results, encounter notes, upcoming appointments, etc.  Non-urgent messages can be sent to your provider as well.   To learn more about what you can do with MyChart, go to [email or web address].    Your next appointment:   FOLLOW UP AS SCHEDULED   The format for your next appointment:   In Person  Provider:   Carlean Jews, PA-C   Other Instructions       Signed, Manson Passey, PA  05/03/2020 10:44 AM    [facility] [address] [place], [place], [place]  [address] Phone: [phone]; Fax: [phone]

## 2020-05-04 LAB — BASIC METABOLIC PANEL
BUN/Creatinine Ratio: 11 (ref 10–24)
BUN: 9 mg/dL (ref 8–27)
CO2: 28 mmol/L (ref 20–29)
Calcium: 8.8 mg/dL (ref 8.6–10.2)
Chloride: 100 mmol/L (ref 96–106)
Creatinine, Ser: 0.84 mg/dL (ref 0.76–1.27)
GFR calc Af Amer: 105 mL/min/{1.73_m2} (ref >59)
GFR calc non Af Amer: 91 mL/min/{1.73_m2} (ref >59)
Glucose: 117 mg/dL — ABNORMAL HIGH (ref 65–99)
Potassium: 4.4 mmol/L (ref 3.5–5.2)
Sodium: 141 mmol/L (ref 134–144)

## 2020-05-04 LAB — PRO B NATRIURETIC PEPTIDE: NT-Pro BNP: 397 pg/mL — ABNORMAL HIGH (ref 0–376)

## 2020-05-11 DIAGNOSIS — R7309 Other abnormal glucose: Secondary | ICD-10-CM | POA: Diagnosis not present

## 2020-05-11 DIAGNOSIS — E875 Hyperkalemia: Secondary | ICD-10-CM | POA: Diagnosis not present

## 2020-05-11 DIAGNOSIS — I5022 Chronic systolic (congestive) heart failure: Secondary | ICD-10-CM | POA: Diagnosis not present

## 2020-05-11 DIAGNOSIS — E1129 Type 2 diabetes mellitus with other diabetic kidney complication: Secondary | ICD-10-CM | POA: Diagnosis not present

## 2020-05-11 DIAGNOSIS — M5430 Sciatica, unspecified side: Secondary | ICD-10-CM | POA: Diagnosis not present

## 2020-05-19 DIAGNOSIS — Z23 Encounter for immunization: Secondary | ICD-10-CM | POA: Diagnosis not present

## 2020-05-22 ENCOUNTER — Telehealth: Payer: Self-pay | Admitting: Internal Medicine

## 2020-05-22 ENCOUNTER — Other Ambulatory Visit: Payer: Self-pay | Admitting: Methadone

## 2020-05-22 DIAGNOSIS — G894 Chronic pain syndrome: Secondary | ICD-10-CM | POA: Diagnosis not present

## 2020-05-22 NOTE — Telephone Encounter (Signed)
Hi Dr. Ladona Ridgel,  Patient is scheduled for a thoracic spine laminectomy on 06/16/2020. He is on Aspirin (although they did not specifically mention needing to hold this). He has a history of mild to moderate CAD, severe AS s/p TAVR in 06/2019, chronic combined CHF with EF of 50-55% on Echo in 06/2019, CHB s/p PPM, LBBB, HTN, DM, OSA not on CPAP. He was recently seen by [name] on 05/03/2020 and was noted to be a little volume overloaded. BNP was mildly elevated in the 300's. Lasix was increased for a few days. We will call to make sure patient has improved and is stable from a cardiac standpoint. However, is it OK for him to hold Aspirin for spinal procedure?  Please route response back to P CV DIV PREOP.  Thank you! Daniel Valenzuela

## 2020-05-22 NOTE — Telephone Encounter (Signed)
    [facility] Pre-operative Risk Assessment    HEARTCARE STAFF: - Please ensure there is not already an duplicate clearance open for this procedure. - Under Visit Info/Reason for Call, type in Other and utilize the format Clearance MM/DD/YY or Clearance TBD. Do not use dashes or single digits. - If request is for dental extraction, please clarify the # of teeth to be extracted.  Request for surgical clearance:  1. What type of surgery is being performed? T8 laminectomy for place ment for the spine system  2. When is this surgery scheduled? 06/16/20  3. What type of clearance is required (medical clearance)? medical  4. Are there any medications that need to be held prior to surgery and how long? Not sure  5. Practice name and name of physician performing surgery? Dr. Gordy Levan [facility]  6. What is the office phone number? [phone]   7.   What is the office fax number? [phone]  8.   Anesthesia type (None, local, MAC, general) ? [facility]ral   Daniel Valenzuela 05/22/2020, 3:02 PM  _________________________________________________________________   (provider comments below)

## 2020-05-23 ENCOUNTER — Other Ambulatory Visit (HOSPITAL_COMMUNITY): Payer: Self-pay | Admitting: Neurology

## 2020-05-23 ENCOUNTER — Telehealth: Payer: Self-pay | Admitting: Pulmonary Disease

## 2020-05-23 DIAGNOSIS — G894 Chronic pain syndrome: Secondary | ICD-10-CM

## 2020-05-23 NOTE — Telephone Encounter (Signed)
Spoke with the pt's spouse, Corrie Dandy  She states that she is needing pulmonary clearance for spinal cord stimulator surgery  Please advise if you are okay with this  If so we will need to fax a letter to Dr Delane Ginger in [place] at [phone]

## 2020-05-23 NOTE — Telephone Encounter (Signed)
Will do!

## 2020-05-23 NOTE — Telephone Encounter (Signed)
He can talk to Daniel Valenzuela about it at his appointment next week  [name], DO [facility] 05/23/2020 5:52 PM

## 2020-05-24 NOTE — Telephone Encounter (Signed)
Called and spoke with pt's wife Corrie Dandy letting her know that at pt's appt with [facility] 11/5, she could see then if she is okay clearing pt for surgery. Mary verbalized understanding.nothing further needed.

## 2020-05-24 NOTE — Telephone Encounter (Signed)
May proceed with laminectomy. Low risk. Ok to hold ASA for spinal procedure.

## 2020-05-25 NOTE — Telephone Encounter (Signed)
   Primary Cardiologist: Lewayne Bunting, MD  Chart reviewed as part of pre-operative protocol coverage. Patient was contacted 05/25/2020 in reference to pre-operative risk assessment for pending surgery as outlined below.  Daniel Valenzuela was last seen on 05/03/20 by Chelsea Aus, PA-C and was recommended to increase his lasix for possible volume overload.  Since that day, Daniel Valenzuela has done well. No complaints of SOB or chest pain.  Per Dr. Ladona Ridgel, the patient would be at acceptable risk for the planned procedure without further cardiovascular testing. He can hold his aspirin 7 days prior to his procedure and should restart when cleared to do so by his surgeon.  The patient was advised that if he develops new symptoms prior to surgery to contact our office to arrange for a follow-up visit, and he verbalized understanding.  I will route this recommendation to the requesting party via Epic fax function and remove from pre-op pool. Please call with questions.  Beatriz Stallion, PA-C 05/25/2020, 8:41 AM

## 2020-05-30 ENCOUNTER — Ambulatory Visit: Payer: Medicare Other | Admitting: Primary Care

## 2020-06-01 ENCOUNTER — Ambulatory Visit (HOSPITAL_COMMUNITY)
Admission: RE | Admit: 2020-06-01 | Discharge: 2020-06-01 | Disposition: A | Discharge status: Home / Self Care | Payer: Medicare Other | Source: Ambulatory Visit | Attending: Neurology | Admitting: Neurology

## 2020-06-01 ENCOUNTER — Other Ambulatory Visit: Payer: Self-pay

## 2020-06-01 DIAGNOSIS — G894 Chronic pain syndrome: Secondary | ICD-10-CM | POA: Diagnosis not present

## 2020-06-01 DIAGNOSIS — M47814 Spondylosis without myelopathy or radiculopathy, thoracic region: Secondary | ICD-10-CM | POA: Diagnosis not present

## 2020-06-01 NOTE — Progress Notes (Signed)
  Informed of MRI for today.   Device system confirmed to be MRI conditional, with implant date > 6 weeks ago and no evidence of abandoned or epicardial leads in review of most recent CXR Interrogation from today reviewed, pt is currently AP-VS at ~76 bpm Change device settings for MRI to DOO at 90 bpm  Tachy-therapies to off if applicable.  Program device back to pre-MRI settings after completion of exam.  Graciella Freer, PA-C  06/01/2020 1:10 PM

## 2020-06-01 NOTE — Progress Notes (Signed)
Patient here for MRI with pacemaker. Transmission sent. Orders received for DOO rate of 95

## 2020-06-01 NOTE — Progress Notes (Signed)
Patient reprogrammed to initial settings. Transmission sent. Taken out by MRI staff via wheelchair

## 2020-06-02 ENCOUNTER — Encounter: Payer: Self-pay | Admitting: Primary Care

## 2020-06-02 ENCOUNTER — Ambulatory Visit (INDEPENDENT_AMBULATORY_CARE_PROVIDER_SITE_OTHER): Payer: Medicare Other | Admitting: Primary Care

## 2020-06-02 DIAGNOSIS — Z01811 Encounter for preprocedural respiratory examination: Secondary | ICD-10-CM | POA: Diagnosis not present

## 2020-06-02 NOTE — Assessment & Plan Note (Addendum)
Cleared from pulmonary standpoint for spinal cord surgery. He is an intermediate risk for prolonged mechanical ventilatory and/or post-op pulmonary complications d.t nature of surgery, obesity and hx sleep apnea. He has no underlying pulmonary conditions other than pulmonary nodules. He has not had any recent respiratory infections and denies acute respiratory symptoms. Would recommend pre-op CXR on Nov 12th.   Major Pulmonary risks identified in the multifactorial risk analysis are but not limited to a) pneumonia; b) recurrent intubation risk; c) prolonged or recurrent acute respiratory failure needing mechanical ventilation; d) prolonged hospitalization; e) DVT/Pulmonary embolism; f) Acute Pulmonary edema  Recommend 1. Short duration of surgery and avoid paralytics if possible 2. Recovery in step down or ICU with Pulmonary consultation if necessary  3. DVT prophylaxis 4. Aggressive pulmonary toilet with O2, bronchodilatation, and incentive spirometry and early ambulation

## 2020-06-02 NOTE — Patient Instructions (Signed)
Cleared from pulmonary standpoint for spinal surgery. You have an intermediate risk for prolonged mechanical ventilation and/or post-op pulmonary complications. This is mostly d/t type of surgery, BMI and hx sleep apnea.   Please let anesthesia know that you have a hx sleep apnea not on CPAP. Recommend you have chest xray with them pre-op.  Encourage early ambulation after surgery when able, use incentive spirometer 10 breaths/hour and wear compression stockings as directed.   Recommend 1. Short duration of surgery as much as possible and avoid paralytic if possible 2. Recovery in step down or ICU with Pulmonary consultation 3. DVT prophylaxis 4. Aggressive pulmonary toilet with o2, bronchodilatation, and incentive spirometry and early ambulation

## 2020-06-02 NOTE — Progress Notes (Signed)
@Patient  ID: Daniel Valenzuela, male    DOB: 24-Sep-1950, 69 y.o.   MRN: [id number]  Chief Complaint  Patient presents with  . Follow-up    Pt is here for surgical clearance due to needing to have spinal cord stimulation surgery. Pt is scheduled to have the surgery 11/19 by Dr. Carman Ching.    Referring provider: Carylon Perches, MD  HPI: 69 year old male, never smoked. Past medical history significant for morbid obesity, lung nodules, sleep apnea.  Patient of Dr. Tonia Brooms, seen for initial consult on 11/25/2019.   Previous [facility] pulmonary encounters: 11/25/19- Consult, pulmonary nodules 69 year old gentleman morbidly obese, BMI 52.  Referred for pulmonary nodule evaluation.  Has history of aortic stenosis status post valve placement, history of complete heart block with device placement.  He had CT scan imaging which revealed nodule and is having his coronaries evaluated.  This led to CAT scan imaging of the chest which revealed upper lobe and lower lobe scattered clustered pulmonary nodules.  He is a lifelong non-smoker.  Of note his CT scan also revealed a food within the esophagus.  He has a history of gastric band procedure.  Unfortunately has not been able to lose any significant amount of weight.  He lost about 100 pounds but has gained 50-60 of it back.  Patient has no prior history of cancer.  In all nodules are subcentimeter in size largest being 6 mm.  Of note the patient also has obstructive sleep apnea likely due to body habitus.  He is noncompliant with his CPAP machine per his wife.  06/02/2020- Interim  Patient presents today for surgical clearance for spinal cord stimulator. Surgery will be outpatient and approximately 1-2 hours in duration. He had trial stimulator placed on Oct 5th for 1 week, this helped his back pain considerably. He does not have any underlying pulmonary disorders other than pulmonary nodules. He has had no recent respiratory infections. He has hx sleep apnea, not on cpap. He  experiences occasional dyspnea when fluid overloaded. He takes lasix as prescribed.  He has been approved for back surgery by cardiology. Denies fever, chills, shortness of breath, chest tightness or cough.    1) RISK FOR PROLONGED MECHANICAL VENTILAION - > 48h  1A) Arozullah - Prolonged mech ventilation risk Arozullah Postperative Pulmonary Risk Score - for mech ventilation dependence >48h USAA, Ann Surg 2000, major non-cardiac surgery) Comment Score  Type of surgery - abd ao aneurysm (27), thoracic (21), neurosurgery / upper abdominal / vascular (21), neck (11) Spinal cord stimulator 21  Emergency Surgery - (11)  0  ALbumin < 3 or poor nutritional state - (9)  0  BUN > 30 -  (8)  0  Partial or completely dependent functional status - (7)  7  COPD -  (6)  0  Age - 60 to 69 (4), > 70  (6)  4  TOTAL  32  Risk Stratifcation scores  - < 10 (0.5%), 11-19 (1.8%), 20-27 (4.2%), 28-40 (10.1%), >40 (26.6%)  10.1% risk for prolonged mech ventilation     1B) GUPTA - Prolonged Mech Vent Risk Score source Risk  Guptal post op prolonged mech ventilation > 48h or reintubation < 30 days - ACS 2007-2008 dataset - [email or web address] 3.0 % Risk of mechanical ventilation for >48 hrs after surgery, or unplanned intubation ?30 days of surgery    2) RISK FOR POST OP PNEUMONIA Score source Risk  Chales Abrahams - Post Op Pnemounia risk  [employer] 2.0 % Risk of  postoperative pneumonia    R3) ISK FOR ANY POST-OP PULMONARY COMPLICATION Score source Risk  CANET/ARISCAT Score - risk for ANY/ALl pulmonary complications - > risk of in-hospital post-op pulmonary complications (composite including respiratory failure, respiratory infection, pleural effusion, atelectasis, pneumothorax, bronchospasm, aspiration pneumonitis) [employer].es - based on age,  anemia, pulse ox, resp infection prior 30d, incision site, duration of surgery, and emergency v elective surgery Intermediate risk 13.3% risk of in-hospital post-op pulmonary complications (composite including respiratory failure, respiratory infection, pleural effusion, atelectasis, pneumothorax, bronchospasm, aspiration pneumonitis)      Allergies  Allergen Reactions  . Methyldopa-Hydrochlorothiazide Other (See Comments)    GYNOCOMASTIA  . Motrin [Ibuprofen] Other (See Comments)    GYNOCOMASTIA  . Nsaids Other (See Comments)    Patient has had lap band surgery- cannot tolerate this class of meds  . Amoxicillin Rash    Did it involve swelling of the face/tongue/throat, SOB, or low BP? No Did it involve sudden or severe rash/hives, skin peeling, or any reaction on the inside of your mouth or nose? Yes Did you need to seek medical attention at a hospital or doctor's office? No When did it last happen?More than 30 years ago If all above answers are "NO", may proceed with cephalosporin use. .    Immunization History  Administered Date(s) Administered  . Influenza, High Dose Seasonal PF 05/19/2020  . Moderna SARS-COVID-2 Vaccination 11/05/2019, 12/03/2019  . Pneumococcal Polysaccharide-23 08/31/2017    Past Medical History:  Diagnosis Date  . Aortic stenosis, moderate 07/2018   felt to be moderate at cath Jan 2020  . CAD (coronary artery disease) 07/2018   mild non obstructive  . Chronic back pain    "mainly lower right now" (08/12/2018)  . Chronic combined systolic and diastolic heart failure (HCC)   . Depression   . Diabetic neuropathy (HCC)   . First degree AV block   . GERD (gastroesophageal reflux disease)   . High cholesterol   . Hypertension   . Morbid obesity with body mass index of 50 or higher (HCC)   . NICM (nonischemic cardiomyopathy) (HCC) 07/2018   EF 35-40%  . Pulmonary nodules    multiple small pulmonary nodules in right lung noted on pre tavr CT. non  contrast CT follow up in 3-6 monhts  . RBBB   . Renal lesion    right renal lesion --> MRI recommended  . S/P TAVR (transcatheter aortic valve replacement)    a. s/p TAVR with a 29 mm Edwards Sapien 3 THV via the TF apprach on 07/06/19 wtih Dr. Aundra Dubin and Dr Lavinia Sharps  . Severe aortic stenosis    s/p tavr  . Sleep apnea    "lost weight; not on CPAP anymore" (08/12/2018)  . Spinal stenosis of lumbar region    "w/slipped disc" (08/12/2018)  . Syncope    Felt to be secondary to autonomic dysfunction-s/p Loop recorder Feb 2019  . Type II diabetes mellitus (HCC)     Tobacco History: Social History   Tobacco Use  Smoking Status Never Smoker  Smokeless Tobacco Never Used   Counseling given: Not Answered   Outpatient Medications Prior to Visit  Medication Sig Dispense Refill  . aspirin 81 MG chewable tablet Chew 1 tablet (81 mg total) by mouth daily.    Marland Kitchen atorvastatin (LIPITOR) 40 MG tablet Take 40 mg by mouth daily.     Marland Kitchen buPROPion (WELLBUTRIN XL) 300 MG 24 hr tablet Take 300 mg by mouth daily.     . Calcium-Magnesium-Zinc (  CAL-MAG-ZINC PO) Take 1 tablet by mouth daily with breakfast.    . chlorhexidine (PERIDEX) 0.12 % solution Use as directed 15 mLs in the mouth or throat 2 (two) times daily. 120 mL 6  . clindamycin (CLEOCIN) 300 MG capsule Take 2 capsules (600 mg total) by mouth as directed. Take 2 tablets 1 hour prior to dental work including cleanings. 6 capsule 12  . famotidine (PEPCID) 40 MG tablet Take 40 mg by mouth daily.    . furosemide (LASIX) 40 MG tablet TAKE 1 1/2 TABLET BY MOUTH IN THE A.M, TAKE 1 TABLET BY MOUTH IN THE P.M. X'S 5 DAYS THEN GO BACK TO 1 1/2 TABLET IN THE A.M AND 1 TABLET IN THE PM ONLY AS NEEDED FOR SHORTNESS OF BREATH 180 tablet 3  . gabapentin (NEURONTIN) 600 MG tablet Take 600-1,200 mg by mouth See admin instructions. Take 1 tablet (600 mg) by mouth every morning & take 2 tablets (1200 mg) by mouth at bedtime.    Marland Kitchen HYDROcodone-acetaminophen  (NORCO/VICODIN) 5-325 MG tablet Take 1 tablet by mouth 4 (four) times daily as needed (pain.).     Marland Kitchen insulin NPH Human (NOVOLIN N RELION) 100 UNIT/ML injection Inject 10-40 Units into the skin 2 (two) times daily. *Hold for blood glucose reading less than 140*    . insulin regular (NOVOLIN R RELION) 100 units/mL injection Inject 0-20 Units into the skin See admin instructions. Sliding Scale Insulin two to three times a day before meals    . metoprolol succinate (TOPROL-XL) 25 MG 24 hr tablet Take 25 mg by mouth daily.    . Multiple Vitamin (MULTIVITAMIN WITH MINERALS) TABS tablet Take 1 tablet by mouth daily. One-A-Day Multivitamin    . pantoprazole (PROTONIX) 40 MG tablet Take 40 mg by mouth daily.     . silver sulfADIAZINE (SILVADENE) 1 % cream APPLY TO AFFECTED AREA BID    . tamsulosin (FLOMAX) 0.4 MG CAPS capsule Take 1 capsule (0.4 mg total) by mouth daily after supper. 30 capsule 0  . vitamin B-12 (CYANOCOBALAMIN) 1000 MCG tablet Take 1,000 mcg by mouth daily.    Marland Kitchen losartan (COZAAR) 25 MG tablet Take 1 tablet (25 mg total) by mouth daily. 90 tablet 3   No facility-administered medications prior to visit.   Review of Systems  Review of Systems  Constitutional: Negative.   HENT: Negative.   Respiratory: Negative for apnea, cough, chest tightness, shortness of breath and wheezing.   Cardiovascular: Negative.   Musculoskeletal: Positive for back pain.   Physical Exam  BP 128/80 (BP Location: Left Wrist, Cuff Size: Large)   Pulse 68   Temp 97.9 F (36.6 C) (Other (Comment)) Comment (Src): wrist  Ht 5\' 11"  (1.803 m)   Wt (!) 400 lb 6.4 oz (181.6 kg)   SpO2 98%   BMI 55.84 kg/m  Physical Exam Constitutional:      General: He is not in acute distress.    Appearance: Normal appearance. He is obese. He is not ill-appearing, toxic-appearing or diaphoretic.  HENT:     Mouth/Throat:     Mouth: Mucous membranes are moist.     Pharynx: Oropharynx is clear.     Comments: Mallampati  class II Cardiovascular:     Rate and Rhythm: Normal rate and regular rhythm.     Comments: Trace BLE edema Pulmonary:     Effort: Pulmonary effort is normal. No respiratory distress.     Breath sounds: Normal breath sounds. No wheezing or rhonchi.  Comments: CTA Musculoskeletal:        General: Normal range of motion.     Comments: Ambulates with cane  Skin:    General: Skin is warm and dry.  Neurological:     General: No focal deficit present.     Mental Status: He is alert and oriented to person, place, and time. Mental status is at baseline.  Psychiatric:        Mood and Affect: Mood normal.        Behavior: Behavior normal.        Thought Content: Thought content normal.        Judgment: Judgment normal.      Lab Results:  CBC    Component Value Date/Time   WBC 7.3 07/08/2019 0600   RBC 4.21 (L) 07/08/2019 0600   HGB 12.0 (L) 07/08/2019 0600   HCT 38.1 (L) 07/08/2019 0600   PLT 148 (L) 07/08/2019 0600   MCV 90.5 07/08/2019 0600   MCH 28.5 07/08/2019 0600   MCHC 31.5 07/08/2019 0600   RDW 15.1 07/08/2019 0600   LYMPHSABS 1.6 06/12/2019 0558   MONOABS 0.9 06/12/2019 0558   EOSABS 0.1 06/12/2019 0558   BASOSABS 0.0 06/12/2019 0558    BMET    Component Value Date/Time   NA 141 05/03/2020 0936   K 4.4 05/03/2020 0936   CL 100 05/03/2020 0936   CO2 28 05/03/2020 0936   GLUCOSE 117 (H) 05/03/2020 0936   GLUCOSE 193 (H) 07/08/2019 0600   BUN 9 05/03/2020 0936   CREATININE 0.84 05/03/2020 0936   CALCIUM 8.8 05/03/2020 0936   GFRNONAA 91 05/03/2020 0936   GFRAA 105 05/03/2020 0936    BNP    Component Value Date/Time   BNP 249.8 (H) 07/02/2019 0946    ProBNP    Component Value Date/Time   PROBNP 397 (H) 05/03/2020 0936    Imaging: MR THORACIC SPINE WO CONTRAST  Result Date: 06/02/2020 CLINICAL DATA:  Chronic pain syndrome, evaluate for spinal cord stimulator EXAM: MRI THORACIC SPINE WITHOUT CONTRAST TECHNIQUE: Multiplanar, multisequence MR  imaging of the thoracic spine was performed. No intravenous contrast was administered. COMPARISON:  None. FINDINGS: Alignment:  Negative for listhesis.  Mild dextrocurvature Vertebrae: Multilevel bridging osteophyte/syndesmophyte, present at T6-T12 and T4-5. No fracture, discitis, or aggressive bone lesion Cord:  Normal signal and morphology. Paraspinal and other soft tissues: Negative Disc levels: Advanced degenerative facet and endplate spurring at T5-6. Prominent spurring at the right costovertebral joint. Right foraminal impingement at this level. Diffusely patent spinal canal. IMPRESSION: 1. Spondyloarthropathy with multilevel ankylosis. 2. T5-6 focal advanced disc and facet degeneration being located between multilevel ankylosis, with right foraminal impingement. 3. Diffusely patent spinal canal. Electronically Signed   By: Marnee Spring M.D.   On: 06/02/2020 05:48    Assessment & Plan:   Pre-operative respiratory examination Cleared from pulmonary standpoint for spinal cord surgery. He is an intermediate risk for prolonged mechanical ventilatory and/or post-op pulmonary complications d.t nature of surgery, obesity and hx sleep apnea. He has no underlying pulmonary conditions other than pulmonary nodules. He has not had any recent respiratory infections and denies acute respiratory symptoms. Would recommend pre-op CXR on Nov 12th.   Major Pulmonary risks identified in the multifactorial risk analysis are but not limited to a) pneumonia; b) recurrent intubation risk; c) prolonged or recurrent acute respiratory failure needing mechanical ventilation; d) prolonged hospitalization; e) DVT/Pulmonary embolism; f) Acute Pulmonary edema  Recommend 1. Short duration of surgery and avoid paralytics  if possible 2. Recovery in step down or ICU with Pulmonary consultation if necessary  3. DVT prophylaxis 4. Aggressive pulmonary toilet with O2, bronchodilatation, and incentive spirometry and early  ambulation     Glenford Bayley, NP 06/02/2020

## 2020-06-02 NOTE — Progress Notes (Signed)
Thanks for seeing him today  [name], DO [facility] 06/02/2020 5:32 PM

## 2020-06-07 ENCOUNTER — Telehealth: Payer: Self-pay | Admitting: Primary Care

## 2020-06-08 NOTE — Telephone Encounter (Signed)
Ov note from 06/02/20 was already faxed and this stated pt was cleared for surgery. Did they receive this? LMTCB for Lauren.

## 2020-06-08 NOTE — Telephone Encounter (Signed)
[facility] returning call on this pr [phone].Caren Griffins

## 2020-06-08 NOTE — Telephone Encounter (Signed)
Spoke with Daniel Valenzuela  She states that they never received 06/02/20 ov note  I have refaxed to her attn at the number provided, which I verified with her  Nothing further needed

## 2020-06-08 NOTE — Telephone Encounter (Signed)
Direct # [phone].Caren Griffins

## 2020-06-09 DIAGNOSIS — I10 Essential (primary) hypertension: Secondary | ICD-10-CM | POA: Diagnosis not present

## 2020-06-09 DIAGNOSIS — Z01811 Encounter for preprocedural respiratory examination: Secondary | ICD-10-CM | POA: Diagnosis not present

## 2020-06-16 DIAGNOSIS — E870 Hyperosmolality and hypernatremia: Secondary | ICD-10-CM | POA: Diagnosis not present

## 2020-06-16 DIAGNOSIS — F05 Delirium due to known physiological condition: Secondary | ICD-10-CM | POA: Diagnosis not present

## 2020-06-16 DIAGNOSIS — M545 Low back pain, unspecified: Secondary | ICD-10-CM | POA: Diagnosis not present

## 2020-06-16 DIAGNOSIS — R252 Cramp and spasm: Secondary | ICD-10-CM | POA: Diagnosis not present

## 2020-06-16 DIAGNOSIS — E876 Hypokalemia: Secondary | ICD-10-CM | POA: Diagnosis not present

## 2020-06-16 DIAGNOSIS — I10 Essential (primary) hypertension: Secondary | ICD-10-CM | POA: Diagnosis not present

## 2020-06-16 DIAGNOSIS — G4733 Obstructive sleep apnea (adult) (pediatric): Secondary | ICD-10-CM | POA: Diagnosis present

## 2020-06-16 DIAGNOSIS — R918 Other nonspecific abnormal finding of lung field: Secondary | ICD-10-CM | POA: Diagnosis not present

## 2020-06-16 DIAGNOSIS — R404 Transient alteration of awareness: Secondary | ICD-10-CM | POA: Diagnosis not present

## 2020-06-16 DIAGNOSIS — R569 Unspecified convulsions: Secondary | ICD-10-CM | POA: Diagnosis not present

## 2020-06-16 DIAGNOSIS — R509 Fever, unspecified: Secondary | ICD-10-CM | POA: Diagnosis not present

## 2020-06-16 DIAGNOSIS — I110 Hypertensive heart disease with heart failure: Secondary | ICD-10-CM | POA: Diagnosis present

## 2020-06-16 DIAGNOSIS — M79651 Pain in right thigh: Secondary | ICD-10-CM | POA: Diagnosis not present

## 2020-06-16 DIAGNOSIS — R7401 Elevation of levels of liver transaminase levels: Secondary | ICD-10-CM | POA: Diagnosis not present

## 2020-06-16 DIAGNOSIS — E7800 Pure hypercholesterolemia, unspecified: Secondary | ICD-10-CM | POA: Diagnosis present

## 2020-06-16 DIAGNOSIS — M79652 Pain in left thigh: Secondary | ICD-10-CM | POA: Diagnosis not present

## 2020-06-16 DIAGNOSIS — Z953 Presence of xenogenic heart valve: Secondary | ICD-10-CM | POA: Diagnosis not present

## 2020-06-16 DIAGNOSIS — G4730 Sleep apnea, unspecified: Secondary | ICD-10-CM | POA: Diagnosis present

## 2020-06-16 DIAGNOSIS — Z20822 Contact with and (suspected) exposure to covid-19: Secondary | ICD-10-CM | POA: Diagnosis not present

## 2020-06-16 DIAGNOSIS — I509 Heart failure, unspecified: Secondary | ICD-10-CM | POA: Diagnosis present

## 2020-06-16 DIAGNOSIS — G931 Anoxic brain damage, not elsewhere classified: Secondary | ICD-10-CM | POA: Diagnosis not present

## 2020-06-16 DIAGNOSIS — F32A Depression, unspecified: Secondary | ICD-10-CM | POA: Diagnosis present

## 2020-06-16 DIAGNOSIS — R258 Other abnormal involuntary movements: Secondary | ICD-10-CM | POA: Diagnosis not present

## 2020-06-16 DIAGNOSIS — E1042 Type 1 diabetes mellitus with diabetic polyneuropathy: Secondary | ICD-10-CM | POA: Diagnosis not present

## 2020-06-16 DIAGNOSIS — G253 Myoclonus: Secondary | ICD-10-CM | POA: Diagnosis not present

## 2020-06-16 DIAGNOSIS — Z95810 Presence of automatic (implantable) cardiac defibrillator: Secondary | ICD-10-CM | POA: Diagnosis not present

## 2020-06-16 DIAGNOSIS — Z6841 Body Mass Index (BMI) 40.0 and over, adult: Secondary | ICD-10-CM | POA: Diagnosis not present

## 2020-06-16 DIAGNOSIS — M1990 Unspecified osteoarthritis, unspecified site: Secondary | ICD-10-CM | POA: Diagnosis present

## 2020-06-16 DIAGNOSIS — I469 Cardiac arrest, cause unspecified: Secondary | ICD-10-CM | POA: Diagnosis not present

## 2020-06-16 DIAGNOSIS — M48061 Spinal stenosis, lumbar region without neurogenic claudication: Secondary | ICD-10-CM | POA: Diagnosis present

## 2020-06-16 DIAGNOSIS — K219 Gastro-esophageal reflux disease without esophagitis: Secondary | ICD-10-CM | POA: Diagnosis present

## 2020-06-16 DIAGNOSIS — Z538 Procedure and treatment not carried out for other reasons: Secondary | ICD-10-CM | POA: Diagnosis not present

## 2020-06-16 DIAGNOSIS — Z950 Presence of cardiac pacemaker: Secondary | ICD-10-CM | POA: Diagnosis not present

## 2020-06-16 DIAGNOSIS — R55 Syncope and collapse: Secondary | ICD-10-CM | POA: Diagnosis not present

## 2020-06-16 DIAGNOSIS — I97711 Intraoperative cardiac arrest during other surgery: Secondary | ICD-10-CM | POA: Diagnosis not present

## 2020-06-16 DIAGNOSIS — E119 Type 2 diabetes mellitus without complications: Secondary | ICD-10-CM | POA: Diagnosis present

## 2020-06-16 DIAGNOSIS — G894 Chronic pain syndrome: Secondary | ICD-10-CM | POA: Diagnosis present

## 2020-06-16 DIAGNOSIS — G9340 Encephalopathy, unspecified: Secondary | ICD-10-CM | POA: Diagnosis not present

## 2020-06-19 DIAGNOSIS — R404 Transient alteration of awareness: Secondary | ICD-10-CM | POA: Diagnosis not present

## 2020-06-27 DIAGNOSIS — G894 Chronic pain syndrome: Secondary | ICD-10-CM | POA: Diagnosis not present

## 2020-06-27 DIAGNOSIS — Z950 Presence of cardiac pacemaker: Secondary | ICD-10-CM | POA: Diagnosis not present

## 2020-06-27 DIAGNOSIS — I110 Hypertensive heart disease with heart failure: Secondary | ICD-10-CM | POA: Diagnosis not present

## 2020-06-27 DIAGNOSIS — R258 Other abnormal involuntary movements: Secondary | ICD-10-CM | POA: Diagnosis not present

## 2020-06-27 DIAGNOSIS — E1042 Type 1 diabetes mellitus with diabetic polyneuropathy: Secondary | ICD-10-CM | POA: Diagnosis not present

## 2020-06-27 DIAGNOSIS — G4733 Obstructive sleep apnea (adult) (pediatric): Secondary | ICD-10-CM | POA: Diagnosis not present

## 2020-06-27 DIAGNOSIS — I2699 Other pulmonary embolism without acute cor pulmonale: Secondary | ICD-10-CM | POA: Diagnosis not present

## 2020-06-27 DIAGNOSIS — E7800 Pure hypercholesterolemia, unspecified: Secondary | ICD-10-CM | POA: Diagnosis not present

## 2020-06-27 DIAGNOSIS — I469 Cardiac arrest, cause unspecified: Secondary | ICD-10-CM | POA: Diagnosis not present

## 2020-06-27 DIAGNOSIS — R5381 Other malaise: Secondary | ICD-10-CM | POA: Diagnosis not present

## 2020-06-27 DIAGNOSIS — E119 Type 2 diabetes mellitus without complications: Secondary | ICD-10-CM | POA: Diagnosis not present

## 2020-06-27 DIAGNOSIS — I10 Essential (primary) hypertension: Secondary | ICD-10-CM | POA: Diagnosis not present

## 2020-06-27 DIAGNOSIS — R52 Pain, unspecified: Secondary | ICD-10-CM | POA: Diagnosis not present

## 2020-06-27 DIAGNOSIS — I499 Cardiac arrhythmia, unspecified: Secondary | ICD-10-CM | POA: Diagnosis not present

## 2020-06-27 DIAGNOSIS — R404 Transient alteration of awareness: Secondary | ICD-10-CM | POA: Diagnosis not present

## 2020-06-27 DIAGNOSIS — G9340 Encephalopathy, unspecified: Secondary | ICD-10-CM | POA: Diagnosis not present

## 2020-06-27 DIAGNOSIS — G253 Myoclonus: Secondary | ICD-10-CM | POA: Diagnosis not present

## 2020-06-27 DIAGNOSIS — Z20822 Contact with and (suspected) exposure to covid-19: Secondary | ICD-10-CM | POA: Diagnosis not present

## 2020-06-27 DIAGNOSIS — I4891 Unspecified atrial fibrillation: Secondary | ICD-10-CM | POA: Diagnosis not present

## 2020-06-27 DIAGNOSIS — E1165 Type 2 diabetes mellitus with hyperglycemia: Secondary | ICD-10-CM | POA: Diagnosis not present

## 2020-06-27 DIAGNOSIS — I509 Heart failure, unspecified: Secondary | ICD-10-CM | POA: Diagnosis not present

## 2020-06-27 DIAGNOSIS — Z8674 Personal history of sudden cardiac arrest: Secondary | ICD-10-CM | POA: Diagnosis not present

## 2020-06-27 DIAGNOSIS — R4182 Altered mental status, unspecified: Secondary | ICD-10-CM | POA: Diagnosis not present

## 2020-06-27 DIAGNOSIS — R092 Respiratory arrest: Secondary | ICD-10-CM | POA: Diagnosis not present

## 2020-06-27 DIAGNOSIS — G931 Anoxic brain damage, not elsewhere classified: Secondary | ICD-10-CM | POA: Diagnosis not present

## 2020-06-27 DIAGNOSIS — F329 Major depressive disorder, single episode, unspecified: Secondary | ICD-10-CM | POA: Diagnosis not present

## 2020-06-28 DIAGNOSIS — E119 Type 2 diabetes mellitus without complications: Secondary | ICD-10-CM | POA: Diagnosis not present

## 2020-06-28 DIAGNOSIS — G894 Chronic pain syndrome: Secondary | ICD-10-CM | POA: Diagnosis not present

## 2020-06-28 DIAGNOSIS — F329 Major depressive disorder, single episode, unspecified: Secondary | ICD-10-CM | POA: Diagnosis not present

## 2020-06-28 DIAGNOSIS — I10 Essential (primary) hypertension: Secondary | ICD-10-CM | POA: Diagnosis not present

## 2020-06-28 DIAGNOSIS — G9340 Encephalopathy, unspecified: Secondary | ICD-10-CM | POA: Diagnosis not present

## 2020-06-28 DIAGNOSIS — R5381 Other malaise: Secondary | ICD-10-CM | POA: Diagnosis not present

## 2020-06-29 ENCOUNTER — Telehealth: Payer: Self-pay | Admitting: Physician Assistant

## 2020-06-29 NOTE — Telephone Encounter (Signed)
Can we get the echo faxed to [phone] and can you ask her if he is able to do a virtual visit on the day he was supposed to have an in office visit?

## 2020-06-29 NOTE — Telephone Encounter (Signed)
Oh that is terrible. I will call her in the morning to discuss.

## 2020-06-29 NOTE — Telephone Encounter (Signed)
Patients wife called and stated that patient was at [facility] where he was given a echocardiogram. To receive results, please call [phone]. Please also call patients wife due to circumstances around patient

## 2020-06-29 NOTE — Telephone Encounter (Signed)
Called [phone] to request echo results to be faxed to [phone]. The echo department mailbox is full - unable to leave message.  Will have the Medical Records team request records.  The patient's wife states the patient coded 06/22/2023 when he went to put in a spinal stimulator.  He was discharged to a rehab center in [place] 11/30 and will be there indefinitely.  She states he has brain damage and is unable to remember things and can barely speak, but is making improvements every day. She thinks a virtual visit "won't be any good" due to his circumstances. She requests Katie review echo and hospital records once obtained and call with her thoughts. She was grateful for call back.

## 2020-07-04 ENCOUNTER — Telehealth: Payer: Self-pay | Admitting: Physician Assistant

## 2020-07-04 NOTE — Telephone Encounter (Signed)
  [facility]   MULTIDISCIPLINARY HEART VALVE TEAM  Patient's wife called our office to cancel his 1 year TAVR appointment and echo. She reported the patient was recently admitted to an outside hospital after coding during a spinal chord stimulator insertion procedure. He had an echo during admission which was reportedly normal. Medical records have been requested. She states that he was doing great from a cardiac standpoint with no significant limitation from fatigue or shortness of breath. She was able to help me fill out a KCCQ as the patient can unfortunately not speak at this time. He is in a long term rehab and slowly recovering but she says he will not be able to make it in for an appointment, which is totally understandable. Based on my conversation the patient had NYHA class II symptoms and now mostly limited by neurologic deficits.   [place] Cardiomyopathy Questionnaire  KCCQ-12 07/04/2020 07/29/2019 06/08/2019  1 a. Ability to shower/bathe Not at all limited Not at all limited Other, Did not do  1 b. Ability to walk 1 block Not at all limited Other, Did not do Extremely limited  1 c. Ability to hurry/jog Not at all limited Other, Did not do Other, Did not do  2. Edema feet/ankles/legs Less than once a week Never over the past 2 weeks Every morning  3. Limited by fatigue 1-2 times a week Never over the past 2 weeks All of the time  4. Limited by dyspnea Never over the past 2 weeks Never over the past 2 weeks All of the time  5. Sitting up / on 3+ pillows Never over the past 2 weeks 3+ times a week, not every day Never over the past 2 weeks  6. Limited enjoyment of life Not limited at all Not limited at all Extremely limited  7. Rest of life w/ symptoms Completely satisfied Mostly satisfied Not at all satisfied  8 a. Participation in hobbies N/A, did not do for other reasons N/A, did not do for other reasons Severely limited  8 b. Participation in chores N/A, did not do for  other reasons N/A, did not do for other reasons Severely limited  8 c. Visiting family/friends N/A, did not do for other reasons N/A, did not do for other reasons N/A, did not do for other reasons      Cline Crock PA-C  [facility]  Pager [phone]

## 2020-07-05 ENCOUNTER — Ambulatory Visit: Payer: Medicare Other | Admitting: Physician Assistant

## 2020-07-05 ENCOUNTER — Other Ambulatory Visit (HOSPITAL_COMMUNITY): Payer: Medicare Other

## 2020-07-10 DIAGNOSIS — E119 Type 2 diabetes mellitus without complications: Secondary | ICD-10-CM | POA: Diagnosis not present

## 2020-07-10 DIAGNOSIS — F329 Major depressive disorder, single episode, unspecified: Secondary | ICD-10-CM | POA: Diagnosis not present

## 2020-07-10 DIAGNOSIS — G9340 Encephalopathy, unspecified: Secondary | ICD-10-CM | POA: Diagnosis not present

## 2020-07-10 DIAGNOSIS — I10 Essential (primary) hypertension: Secondary | ICD-10-CM | POA: Diagnosis not present

## 2020-07-10 DIAGNOSIS — G894 Chronic pain syndrome: Secondary | ICD-10-CM | POA: Diagnosis not present

## 2020-07-10 DIAGNOSIS — R5381 Other malaise: Secondary | ICD-10-CM | POA: Diagnosis not present

## 2020-07-14 NOTE — Telephone Encounter (Signed)
Received extensive records from [facility] which I reviewed. Pt had cardiac arrest prior to elective spinal chord stimulation.   I was able to review echo completed on 06/25/20. This showed normal EF, normally functioning TAVR with a mean gradient of 14 mmHg, peak gradient of 28mm hg and no PVL.  (on page 30 of 50 pages in records).  Will have all records uploaded into Epic.   Cline Crock PA-C  [facility]

## 2020-07-24 ENCOUNTER — Other Ambulatory Visit: Payer: Self-pay | Admitting: Physician Assistant

## 2020-07-24 DIAGNOSIS — E119 Type 2 diabetes mellitus without complications: Secondary | ICD-10-CM | POA: Diagnosis not present

## 2020-07-24 DIAGNOSIS — I5042 Chronic combined systolic (congestive) and diastolic (congestive) heart failure: Secondary | ICD-10-CM

## 2020-07-24 DIAGNOSIS — G894 Chronic pain syndrome: Secondary | ICD-10-CM | POA: Diagnosis not present

## 2020-07-24 DIAGNOSIS — G9340 Encephalopathy, unspecified: Secondary | ICD-10-CM | POA: Diagnosis not present

## 2020-07-24 DIAGNOSIS — R5381 Other malaise: Secondary | ICD-10-CM | POA: Diagnosis not present

## 2020-07-24 DIAGNOSIS — I10 Essential (primary) hypertension: Secondary | ICD-10-CM | POA: Diagnosis not present

## 2020-07-24 DIAGNOSIS — F329 Major depressive disorder, single episode, unspecified: Secondary | ICD-10-CM | POA: Diagnosis not present

## 2020-08-11 DIAGNOSIS — G931 Anoxic brain damage, not elsewhere classified: Secondary | ICD-10-CM | POA: Diagnosis not present

## 2020-08-11 DIAGNOSIS — Z8674 Personal history of sudden cardiac arrest: Secondary | ICD-10-CM | POA: Diagnosis not present

## 2020-08-17 DIAGNOSIS — G894 Chronic pain syndrome: Secondary | ICD-10-CM | POA: Diagnosis not present

## 2020-08-17 DIAGNOSIS — R5381 Other malaise: Secondary | ICD-10-CM | POA: Diagnosis not present

## 2020-08-17 DIAGNOSIS — E119 Type 2 diabetes mellitus without complications: Secondary | ICD-10-CM | POA: Diagnosis not present

## 2020-08-17 DIAGNOSIS — I10 Essential (primary) hypertension: Secondary | ICD-10-CM | POA: Diagnosis not present

## 2020-08-17 DIAGNOSIS — F329 Major depressive disorder, single episode, unspecified: Secondary | ICD-10-CM | POA: Diagnosis not present

## 2020-08-17 DIAGNOSIS — G9340 Encephalopathy, unspecified: Secondary | ICD-10-CM | POA: Diagnosis not present

## 2020-08-18 DIAGNOSIS — I10 Essential (primary) hypertension: Secondary | ICD-10-CM | POA: Diagnosis not present

## 2020-08-18 DIAGNOSIS — R404 Transient alteration of awareness: Secondary | ICD-10-CM | POA: Diagnosis not present

## 2020-08-18 DIAGNOSIS — R4182 Altered mental status, unspecified: Secondary | ICD-10-CM | POA: Diagnosis not present

## 2020-08-18 DIAGNOSIS — E1165 Type 2 diabetes mellitus with hyperglycemia: Secondary | ICD-10-CM | POA: Diagnosis not present

## 2020-08-18 DIAGNOSIS — Z20822 Contact with and (suspected) exposure to covid-19: Secondary | ICD-10-CM | POA: Diagnosis not present

## 2020-08-18 DIAGNOSIS — E7800 Pure hypercholesterolemia, unspecified: Secondary | ICD-10-CM | POA: Diagnosis not present

## 2020-08-18 DIAGNOSIS — R092 Respiratory arrest: Secondary | ICD-10-CM | POA: Diagnosis not present

## 2020-08-18 DIAGNOSIS — I2699 Other pulmonary embolism without acute cor pulmonale: Secondary | ICD-10-CM | POA: Diagnosis not present

## 2020-08-18 DIAGNOSIS — I499 Cardiac arrhythmia, unspecified: Secondary | ICD-10-CM | POA: Diagnosis not present

## 2020-08-18 DIAGNOSIS — I469 Cardiac arrest, cause unspecified: Secondary | ICD-10-CM | POA: Diagnosis not present

## 2020-08-18 DIAGNOSIS — R52 Pain, unspecified: Secondary | ICD-10-CM | POA: Diagnosis not present

## 2020-08-18 DIAGNOSIS — Z950 Presence of cardiac pacemaker: Secondary | ICD-10-CM | POA: Diagnosis not present

## 2020-08-18 DIAGNOSIS — I4891 Unspecified atrial fibrillation: Secondary | ICD-10-CM | POA: Diagnosis not present

## 2020-08-19 DIAGNOSIS — R4182 Altered mental status, unspecified: Secondary | ICD-10-CM | POA: Diagnosis not present

## 2020-08-19 DIAGNOSIS — I2699 Other pulmonary embolism without acute cor pulmonale: Secondary | ICD-10-CM | POA: Diagnosis not present

## 2020-08-19 DIAGNOSIS — I10 Essential (primary) hypertension: Secondary | ICD-10-CM | POA: Diagnosis not present

## 2020-08-19 DIAGNOSIS — Z950 Presence of cardiac pacemaker: Secondary | ICD-10-CM | POA: Diagnosis not present

## 2020-08-19 DIAGNOSIS — Z20822 Contact with and (suspected) exposure to covid-19: Secondary | ICD-10-CM | POA: Diagnosis not present

## 2020-08-19 DIAGNOSIS — E7800 Pure hypercholesterolemia, unspecified: Secondary | ICD-10-CM | POA: Diagnosis not present

## 2020-08-19 DIAGNOSIS — I469 Cardiac arrest, cause unspecified: Secondary | ICD-10-CM | POA: Diagnosis not present

## 2020-08-29 DEATH — deceased
